# Patient Record
Sex: Male | Born: 1935 | Race: Black or African American | Hispanic: No | Marital: Single | Smoking: Current every day smoker
Health system: Southern US, Community
[De-identification: ages and names within clinical notes are randomized; demographics above are authoritative.]

## PROBLEM LIST (undated history)

## (undated) DIAGNOSIS — M899 Disorder of bone, unspecified: Secondary | ICD-10-CM

## (undated) DIAGNOSIS — M25559 Pain in unspecified hip: Secondary | ICD-10-CM

## (undated) DIAGNOSIS — C801 Malignant (primary) neoplasm, unspecified: Secondary | ICD-10-CM

## (undated) DIAGNOSIS — F19921 Other psychoactive substance use, unspecified with intoxication with delirium: Secondary | ICD-10-CM

## (undated) DIAGNOSIS — R1310 Dysphagia, unspecified: Secondary | ICD-10-CM

## (undated) DIAGNOSIS — R972 Elevated prostate specific antigen [PSA]: Secondary | ICD-10-CM

## (undated) DIAGNOSIS — G8929 Other chronic pain: Secondary | ICD-10-CM

## (undated) DIAGNOSIS — R55 Syncope and collapse: Principal | ICD-10-CM

## (undated) DIAGNOSIS — F1010 Alcohol abuse, uncomplicated: Secondary | ICD-10-CM

## (undated) DIAGNOSIS — S22000A Wedge compression fracture of unspecified thoracic vertebra, initial encounter for closed fracture: Secondary | ICD-10-CM

## (undated) DIAGNOSIS — M87059 Idiopathic aseptic necrosis of unspecified femur: Secondary | ICD-10-CM

## (undated) DIAGNOSIS — R319 Hematuria, unspecified: Secondary | ICD-10-CM

## (undated) DIAGNOSIS — J449 Chronic obstructive pulmonary disease, unspecified: Secondary | ICD-10-CM

## (undated) DIAGNOSIS — E162 Hypoglycemia, unspecified: Secondary | ICD-10-CM

## (undated) DIAGNOSIS — Z720 Tobacco use: Secondary | ICD-10-CM

## (undated) DIAGNOSIS — I8290 Acute embolism and thrombosis of unspecified vein: Secondary | ICD-10-CM

## (undated) DIAGNOSIS — M549 Dorsalgia, unspecified: Secondary | ICD-10-CM

## (undated) DIAGNOSIS — M4800 Spinal stenosis, site unspecified: Secondary | ICD-10-CM

## (undated) DIAGNOSIS — I2699 Other pulmonary embolism without acute cor pulmonale: Secondary | ICD-10-CM

## (undated) DIAGNOSIS — F10239 Alcohol dependence with withdrawal, unspecified: Secondary | ICD-10-CM

## (undated) DIAGNOSIS — R946 Abnormal results of thyroid function studies: Secondary | ICD-10-CM

## (undated) DIAGNOSIS — J189 Pneumonia, unspecified organism: Secondary | ICD-10-CM

## (undated) DIAGNOSIS — D539 Nutritional anemia, unspecified: Secondary | ICD-10-CM

## (undated) DIAGNOSIS — I82409 Acute embolism and thrombosis of unspecified deep veins of unspecified lower extremity: Secondary | ICD-10-CM

## (undated) HISTORY — PX: TONSILLECTOMY: SUR1361

## (undated) HISTORY — PX: TOTAL HIP ARTHROPLASTY: SHX124

---

## 2000-12-18 ENCOUNTER — Emergency Department (HOSPITAL_COMMUNITY): Admission: EM | Admit: 2000-12-18 | Discharge: 2000-12-19 | Payer: Self-pay | Admitting: *Deleted

## 2000-12-19 ENCOUNTER — Emergency Department (HOSPITAL_COMMUNITY): Admission: EM | Admit: 2000-12-19 | Discharge: 2000-12-19 | Payer: Self-pay | Admitting: Emergency Medicine

## 2000-12-27 ENCOUNTER — Emergency Department (HOSPITAL_COMMUNITY): Admission: EM | Admit: 2000-12-27 | Discharge: 2000-12-28 | Payer: Self-pay | Admitting: *Deleted

## 2001-01-20 ENCOUNTER — Emergency Department (HOSPITAL_COMMUNITY): Admission: EM | Admit: 2001-01-20 | Discharge: 2001-01-20 | Payer: Self-pay | Admitting: Emergency Medicine

## 2003-09-20 ENCOUNTER — Emergency Department (HOSPITAL_COMMUNITY): Admission: EM | Admit: 2003-09-20 | Discharge: 2003-09-20 | Payer: Self-pay

## 2003-11-07 ENCOUNTER — Emergency Department (HOSPITAL_COMMUNITY): Admission: EM | Admit: 2003-11-07 | Discharge: 2003-11-07 | Payer: Self-pay | Admitting: Emergency Medicine

## 2003-12-01 ENCOUNTER — Observation Stay (HOSPITAL_COMMUNITY): Admission: RE | Admit: 2003-12-01 | Discharge: 2003-12-02 | Payer: Self-pay | Admitting: General Surgery

## 2005-06-26 ENCOUNTER — Emergency Department (HOSPITAL_COMMUNITY): Admission: EM | Admit: 2005-06-26 | Discharge: 2005-06-26 | Payer: Self-pay | Admitting: Emergency Medicine

## 2006-06-13 ENCOUNTER — Emergency Department (HOSPITAL_COMMUNITY): Admission: EM | Admit: 2006-06-13 | Discharge: 2006-06-13 | Payer: Self-pay | Admitting: Emergency Medicine

## 2007-01-31 ENCOUNTER — Ambulatory Visit (HOSPITAL_COMMUNITY): Admission: RE | Admit: 2007-01-31 | Discharge: 2007-01-31 | Payer: Self-pay | Admitting: Internal Medicine

## 2007-02-25 ENCOUNTER — Encounter: Payer: Self-pay | Admitting: Orthopedic Surgery

## 2007-02-26 ENCOUNTER — Inpatient Hospital Stay (HOSPITAL_COMMUNITY): Admission: EM | Admit: 2007-02-26 | Discharge: 2007-03-06 | Payer: Self-pay | Admitting: Emergency Medicine

## 2007-04-30 ENCOUNTER — Inpatient Hospital Stay (HOSPITAL_COMMUNITY): Admission: RE | Admit: 2007-04-30 | Discharge: 2007-05-06 | Payer: Self-pay | Admitting: Internal Medicine

## 2007-08-29 ENCOUNTER — Ambulatory Visit: Payer: Self-pay | Admitting: Orthopedic Surgery

## 2007-08-29 DIAGNOSIS — M8700 Idiopathic aseptic necrosis of unspecified bone: Secondary | ICD-10-CM | POA: Insufficient documentation

## 2007-08-29 DIAGNOSIS — M545 Low back pain, unspecified: Secondary | ICD-10-CM | POA: Insufficient documentation

## 2007-08-29 DIAGNOSIS — M25559 Pain in unspecified hip: Secondary | ICD-10-CM

## 2007-08-29 DIAGNOSIS — M5430 Sciatica, unspecified side: Secondary | ICD-10-CM

## 2007-09-12 ENCOUNTER — Ambulatory Visit (HOSPITAL_COMMUNITY): Admission: RE | Admit: 2007-09-12 | Discharge: 2007-09-12 | Payer: Self-pay | Admitting: Orthopedic Surgery

## 2007-09-12 ENCOUNTER — Telehealth: Payer: Self-pay | Admitting: Orthopedic Surgery

## 2007-09-16 ENCOUNTER — Ambulatory Visit: Payer: Self-pay | Admitting: Orthopedic Surgery

## 2007-09-16 DIAGNOSIS — M4800 Spinal stenosis, site unspecified: Secondary | ICD-10-CM

## 2007-09-16 DIAGNOSIS — Q762 Congenital spondylolisthesis: Secondary | ICD-10-CM

## 2007-09-16 DIAGNOSIS — M5126 Other intervertebral disc displacement, lumbar region: Secondary | ICD-10-CM

## 2007-10-03 ENCOUNTER — Telehealth: Payer: Self-pay | Admitting: Orthopedic Surgery

## 2007-10-22 ENCOUNTER — Encounter: Payer: Self-pay | Admitting: Orthopedic Surgery

## 2008-02-12 ENCOUNTER — Ambulatory Visit: Payer: Self-pay | Admitting: Cardiology

## 2008-02-12 ENCOUNTER — Inpatient Hospital Stay (HOSPITAL_COMMUNITY): Admission: EM | Admit: 2008-02-12 | Discharge: 2008-02-28 | Payer: Self-pay | Admitting: Emergency Medicine

## 2008-02-12 ENCOUNTER — Ambulatory Visit: Payer: Self-pay | Admitting: Orthopedic Surgery

## 2008-02-14 ENCOUNTER — Encounter: Payer: Self-pay | Admitting: Orthopedic Surgery

## 2008-02-17 ENCOUNTER — Encounter (INDEPENDENT_AMBULATORY_CARE_PROVIDER_SITE_OTHER): Payer: Self-pay | Admitting: Internal Medicine

## 2008-02-23 ENCOUNTER — Encounter: Payer: Self-pay | Admitting: Orthopedic Surgery

## 2008-02-25 ENCOUNTER — Encounter: Payer: Self-pay | Admitting: Orthopedic Surgery

## 2008-02-26 ENCOUNTER — Encounter: Payer: Self-pay | Admitting: Orthopedic Surgery

## 2008-02-28 ENCOUNTER — Encounter: Payer: Self-pay | Admitting: Orthopedic Surgery

## 2008-02-28 ENCOUNTER — Inpatient Hospital Stay: Admission: RE | Admit: 2008-02-28 | Discharge: 2008-03-23 | Payer: Self-pay | Admitting: Internal Medicine

## 2008-03-04 ENCOUNTER — Ambulatory Visit: Payer: Self-pay | Admitting: Orthopedic Surgery

## 2008-03-04 DIAGNOSIS — S72143A Displaced intertrochanteric fracture of unspecified femur, initial encounter for closed fracture: Secondary | ICD-10-CM

## 2008-03-18 ENCOUNTER — Encounter: Payer: Self-pay | Admitting: Orthopedic Surgery

## 2008-03-20 ENCOUNTER — Ambulatory Visit (HOSPITAL_COMMUNITY): Admission: RE | Admit: 2008-03-20 | Discharge: 2008-03-20 | Payer: Self-pay | Admitting: Internal Medicine

## 2008-03-24 ENCOUNTER — Telehealth: Payer: Self-pay | Admitting: Orthopedic Surgery

## 2008-04-02 ENCOUNTER — Ambulatory Visit: Payer: Self-pay | Admitting: Orthopedic Surgery

## 2008-04-02 ENCOUNTER — Ambulatory Visit (HOSPITAL_COMMUNITY): Admission: RE | Admit: 2008-04-02 | Discharge: 2008-04-02 | Payer: Self-pay | Admitting: Orthopedic Surgery

## 2008-04-06 ENCOUNTER — Encounter: Payer: Self-pay | Admitting: Orthopedic Surgery

## 2008-05-13 ENCOUNTER — Ambulatory Visit: Payer: Self-pay | Admitting: Orthopedic Surgery

## 2008-06-28 ENCOUNTER — Emergency Department (HOSPITAL_COMMUNITY): Admission: EM | Admit: 2008-06-28 | Discharge: 2008-06-28 | Payer: Self-pay | Admitting: Emergency Medicine

## 2008-07-14 ENCOUNTER — Telehealth: Payer: Self-pay | Admitting: Orthopedic Surgery

## 2008-08-09 ENCOUNTER — Ambulatory Visit: Payer: Self-pay | Admitting: Cardiology

## 2008-08-09 ENCOUNTER — Inpatient Hospital Stay (HOSPITAL_COMMUNITY): Admission: EM | Admit: 2008-08-09 | Discharge: 2008-08-12 | Payer: Self-pay | Admitting: Emergency Medicine

## 2008-08-10 ENCOUNTER — Encounter (INDEPENDENT_AMBULATORY_CARE_PROVIDER_SITE_OTHER): Payer: Self-pay | Admitting: Internal Medicine

## 2008-08-10 ENCOUNTER — Encounter: Payer: Self-pay | Admitting: Cardiology

## 2008-09-10 ENCOUNTER — Telehealth: Payer: Self-pay | Admitting: Orthopedic Surgery

## 2009-08-08 ENCOUNTER — Emergency Department (HOSPITAL_COMMUNITY): Admission: EM | Admit: 2009-08-08 | Discharge: 2009-08-08 | Payer: Self-pay | Admitting: Emergency Medicine

## 2010-01-23 ENCOUNTER — Encounter: Payer: Self-pay | Admitting: General Surgery

## 2010-01-23 ENCOUNTER — Encounter: Payer: Self-pay | Admitting: Orthopedic Surgery

## 2010-03-18 LAB — COMPREHENSIVE METABOLIC PANEL
Albumin: 3.4 g/dL — ABNORMAL LOW (ref 3.5–5.2)
BUN: 9 mg/dL (ref 6–23)
CO2: 21 mEq/L (ref 19–32)
Chloride: 109 mEq/L (ref 96–112)
Creatinine, Ser: 0.74 mg/dL (ref 0.4–1.5)
GFR calc non Af Amer: >60 mL/min (ref >60)
Total Bilirubin: 0.6 mg/dL (ref 0.3–1.2)

## 2010-03-18 LAB — DIFFERENTIAL
Basophils Absolute: 0 10*3/uL (ref 0.0–0.1)
Lymphocytes Relative: 50 % — ABNORMAL HIGH (ref 12–46)
Neutro Abs: 1.7 10*3/uL (ref 1.7–7.7)

## 2010-03-18 LAB — CBC
MCH: 33 pg (ref 26.0–34.0)
MCHC: 33.8 g/dL (ref 30.0–36.0)
MCV: 97.8 fL (ref 78.0–100.0)
Platelets: 263 10*3/uL (ref 150–400)
RBC: 3.69 MIL/uL — ABNORMAL LOW (ref 4.22–5.81)

## 2010-03-18 LAB — POCT CARDIAC MARKERS
CKMB, poc: <1 ng/mL — ABNORMAL LOW (ref 1.0–8.0)
Myoglobin, poc: 32.6 ng/mL (ref 12–200)
Troponin i, poc: <0.05 ng/mL (ref 0.00–0.09)

## 2010-04-09 LAB — BASIC METABOLIC PANEL
BUN: 6 mg/dL (ref 6–23)
CO2: 28 mEq/L (ref 19–32)
Calcium: 8.1 mg/dL — ABNORMAL LOW (ref 8.4–10.5)
Chloride: 101 mEq/L (ref 96–112)
Creatinine, Ser: 0.7 mg/dL (ref 0.4–1.5)
GFR calc Af Amer: >60 mL/min (ref >60)

## 2010-04-09 LAB — COMPREHENSIVE METABOLIC PANEL
ALT: 14 U/L (ref 0–53)
Alkaline Phosphatase: 114 U/L (ref 39–117)
CO2: 28 mEq/L (ref 19–32)
GFR calc non Af Amer: >60 mL/min (ref >60)
Glucose, Bld: 74 mg/dL (ref 70–99)
Potassium: 4 mEq/L (ref 3.5–5.1)
Sodium: 139 mEq/L (ref 135–145)
Total Bilirubin: 0.4 mg/dL (ref 0.3–1.2)

## 2010-04-09 LAB — POCT CARDIAC MARKERS
CKMB, poc: 1.2 ng/mL (ref 1.0–8.0)
Myoglobin, poc: 16.8 ng/mL (ref 12–200)
Myoglobin, poc: 21.6 ng/mL (ref 12–200)

## 2010-04-09 LAB — BLOOD GAS, ARTERIAL
Bicarbonate: 20.9 mEq/L (ref 20.0–24.0)
O2 Saturation: 93.4 %
pO2, Arterial: 72 mmHg — ABNORMAL LOW (ref 80.0–100.0)

## 2010-04-09 LAB — CBC
Hemoglobin: 13.7 g/dL (ref 13.0–17.0)
RBC: 4.07 MIL/uL — ABNORMAL LOW (ref 4.22–5.81)

## 2010-04-09 LAB — RAPID URINE DRUG SCREEN, HOSP PERFORMED
Opiates: NOT DETECTED
Tetrahydrocannabinol: NOT DETECTED

## 2010-04-09 LAB — DIFFERENTIAL
Basophils Absolute: 0 10*3/uL (ref 0.0–0.1)
Blasts: 0 %
Myelocytes: 0 %
Neutro Abs: 1.6 10*3/uL — ABNORMAL LOW (ref 1.7–7.7)
Neutrophils Relative %: 48 % (ref 43–77)
Promyelocytes Absolute: 0 %
nRBC: 0 /100 WBC

## 2010-04-09 LAB — ETHANOL: Alcohol, Ethyl (B): 141 mg/dL — ABNORMAL HIGH (ref 0–10)

## 2010-04-19 LAB — BASIC METABOLIC PANEL
BUN: 3 mg/dL — ABNORMAL LOW (ref 6–23)
BUN: 4 mg/dL — ABNORMAL LOW (ref 6–23)
BUN: 4 mg/dL — ABNORMAL LOW (ref 6–23)
CO2: 28 mEq/L (ref 19–32)
CO2: 27 mEq/L (ref 19–32)
CO2: 27 mEq/L (ref 19–32)
Calcium: 7.6 mg/dL — ABNORMAL LOW (ref 8.4–10.5)
Calcium: 7.6 mg/dL — ABNORMAL LOW (ref 8.4–10.5)
Calcium: 7.6 mg/dL — ABNORMAL LOW (ref 8.4–10.5)
Calcium: 7.6 mg/dL — ABNORMAL LOW (ref 8.4–10.5)
Calcium: 8.5 mg/dL (ref 8.4–10.5)
Chloride: 101 mEq/L (ref 96–112)
Chloride: 102 mEq/L (ref 96–112)
Chloride: 101 mEq/L (ref 96–112)
Chloride: 103 mEq/L (ref 96–112)
Chloride: 104 mEq/L (ref 96–112)
Chloride: 107 mEq/L (ref 96–112)
Creatinine, Ser: 0.62 mg/dL (ref 0.4–1.5)
Creatinine, Ser: 0.77 mg/dL (ref 0.4–1.5)
GFR calc Af Amer: >60 mL/min (ref >60)
GFR calc Af Amer: >60 mL/min (ref >60)
GFR calc Af Amer: >60 mL/min (ref >60)
GFR calc Af Amer: >60 mL/min (ref >60)
GFR calc Af Amer: >60 mL/min (ref >60)
GFR calc Af Amer: >60 mL/min (ref >60)
GFR calc non Af Amer: >60 mL/min (ref >60)
GFR calc non Af Amer: >60 mL/min (ref >60)
GFR calc non Af Amer: >60 mL/min (ref >60)
GFR calc non Af Amer: >60 mL/min (ref >60)
GFR calc non Af Amer: >60 mL/min (ref >60)
GFR calc non Af Amer: >60 mL/min (ref >60)
Glucose, Bld: 108 mg/dL — ABNORMAL HIGH (ref 70–99)
Glucose, Bld: 76 mg/dL (ref 70–99)
Potassium: 3.9 mEq/L (ref 3.5–5.1)
Potassium: 5.1 mEq/L (ref 3.5–5.1)
Sodium: 132 mEq/L — ABNORMAL LOW (ref 135–145)
Sodium: 134 mEq/L — ABNORMAL LOW (ref 135–145)
Sodium: 135 mEq/L (ref 135–145)
Sodium: 135 mEq/L (ref 135–145)
Sodium: 132 mEq/L — ABNORMAL LOW (ref 135–145)
Sodium: 134 mEq/L — ABNORMAL LOW (ref 135–145)

## 2010-04-19 LAB — DIFFERENTIAL
Basophils Absolute: 0 10*3/uL (ref 0.0–0.1)
Basophils Absolute: 0 10*3/uL (ref 0.0–0.1)
Basophils Absolute: 0 10*3/uL (ref 0.0–0.1)
Basophils Absolute: 0.1 10*3/uL (ref 0.0–0.1)
Basophils Relative: 0 % (ref 0–1)
Basophils Relative: 0 % (ref 0–1)
Basophils Relative: 1 % (ref 0–1)
Basophils Relative: 1 % (ref 0–1)
Eosinophils Absolute: 0 10*3/uL (ref 0.0–0.7)
Eosinophils Absolute: 0.1 10*3/uL (ref 0.0–0.7)
Eosinophils Absolute: 0.1 10*3/uL (ref 0.0–0.7)
Eosinophils Absolute: 0.2 10*3/uL (ref 0.0–0.7)
Eosinophils Absolute: 0.2 10*3/uL (ref 0.0–0.7)
Eosinophils Relative: 0 % (ref 0–5)
Eosinophils Relative: 0 % (ref 0–5)
Eosinophils Relative: 1 % (ref 0–5)
Eosinophils Relative: 2 % (ref 0–5)
Eosinophils Relative: 3 % (ref 0–5)
Eosinophils Relative: 4 % (ref 0–5)
Lymphocytes Relative: 16 % (ref 12–46)
Lymphocytes Relative: 19 % (ref 12–46)
Lymphocytes Relative: 25 % (ref 12–46)
Lymphocytes Relative: 27 % (ref 12–46)
Lymphocytes Relative: 30 % (ref 12–46)
Lymphocytes Relative: 39 % (ref 12–46)
Lymphs Abs: 1.5 10*3/uL (ref 0.7–4.0)
Lymphs Abs: 1.6 10*3/uL (ref 0.7–4.0)
Lymphs Abs: 2 10*3/uL (ref 0.7–4.0)
Monocytes Absolute: 0.5 10*3/uL (ref 0.1–1.0)
Monocytes Absolute: 0.8 10*3/uL (ref 0.1–1.0)
Monocytes Absolute: 0.7 10*3/uL (ref 0.1–1.0)
Monocytes Absolute: 0.8 10*3/uL (ref 0.1–1.0)
Monocytes Absolute: 0.9 10*3/uL (ref 0.1–1.0)
Monocytes Relative: 12 % (ref 3–12)
Monocytes Relative: 13 % — ABNORMAL HIGH (ref 3–12)
Monocytes Relative: 10 % (ref 3–12)
Monocytes Relative: 11 % (ref 3–12)
Monocytes Relative: 13 % — ABNORMAL HIGH (ref 3–12)
Monocytes Relative: 9 % (ref 3–12)
Neutro Abs: 4.9 10*3/uL (ref 1.7–7.7)
Neutro Abs: 4.9 10*3/uL (ref 1.7–7.7)
Neutro Abs: 3.7 10*3/uL (ref 1.7–7.7)
Neutro Abs: 3.9 10*3/uL (ref 1.7–7.7)
Neutrophils Relative %: 57 % (ref 43–77)
Neutrophils Relative %: 67 % (ref 43–77)
Neutrophils Relative %: 75 % (ref 43–77)
Neutrophils Relative %: 56 % (ref 43–77)

## 2010-04-19 LAB — CBC
HCT: 28.2 % — ABNORMAL LOW (ref 39.0–52.0)
HCT: 34.6 % — ABNORMAL LOW (ref 39.0–52.0)
HCT: 24.1 % — ABNORMAL LOW (ref 39.0–52.0)
HCT: 26.8 % — ABNORMAL LOW (ref 39.0–52.0)
HCT: 29.4 % — ABNORMAL LOW (ref 39.0–52.0)
Hemoglobin: 11.5 g/dL — ABNORMAL LOW (ref 13.0–17.0)
Hemoglobin: 8.4 g/dL — ABNORMAL LOW (ref 13.0–17.0)
Hemoglobin: 9.5 g/dL — ABNORMAL LOW (ref 13.0–17.0)
Hemoglobin: 8 g/dL — ABNORMAL LOW (ref 13.0–17.0)
Hemoglobin: 8.2 g/dL — ABNORMAL LOW (ref 13.0–17.0)
MCHC: 33.2 g/dL (ref 30.0–36.0)
MCHC: 33.7 g/dL (ref 30.0–36.0)
MCHC: 34.1 g/dL (ref 30.0–36.0)
MCHC: 33.7 g/dL (ref 30.0–36.0)
MCHC: 34.1 g/dL (ref 30.0–36.0)
MCHC: 34.2 g/dL (ref 30.0–36.0)
MCV: 91.9 fL (ref 78.0–100.0)
MCV: 91.9 fL (ref 78.0–100.0)
MCV: 92.1 fL (ref 78.0–100.0)
MCV: 92.8 fL (ref 78.0–100.0)
MCV: 91.2 fL (ref 78.0–100.0)
MCV: 91.4 fL (ref 78.0–100.0)
MCV: 91.9 fL (ref 78.0–100.0)
Platelets: 119 10*3/uL — ABNORMAL LOW (ref 150–400)
Platelets: 127 10*3/uL — ABNORMAL LOW (ref 150–400)
Platelets: 269 10*3/uL (ref 150–400)
Platelets: 420 10*3/uL — ABNORMAL HIGH (ref 150–400)
Platelets: 493 10*3/uL — ABNORMAL HIGH (ref 150–400)
RBC: 3.07 MIL/uL — ABNORMAL LOW (ref 4.22–5.81)
RBC: 3.76 MIL/uL — ABNORMAL LOW (ref 4.22–5.81)
RBC: 2.37 MIL/uL — ABNORMAL LOW (ref 4.22–5.81)
RBC: 2.55 MIL/uL — ABNORMAL LOW (ref 4.22–5.81)
RBC: 2.64 MIL/uL — ABNORMAL LOW (ref 4.22–5.81)
RDW: 19.1 % — ABNORMAL HIGH (ref 11.5–15.5)
RDW: 20.1 % — ABNORMAL HIGH (ref 11.5–15.5)
RDW: 17.8 % — ABNORMAL HIGH (ref 11.5–15.5)
RDW: 18.1 % — ABNORMAL HIGH (ref 11.5–15.5)
RDW: 18.6 % — ABNORMAL HIGH (ref 11.5–15.5)
WBC: 5.5 10*3/uL (ref 4.0–10.5)
WBC: 6 10*3/uL (ref 4.0–10.5)
WBC: 7.7 10*3/uL (ref 4.0–10.5)
WBC: 6.6 10*3/uL (ref 4.0–10.5)
WBC: 6.6 10*3/uL (ref 4.0–10.5)
WBC: 8.4 10*3/uL (ref 4.0–10.5)

## 2010-04-19 LAB — WOUND CULTURE

## 2010-04-19 LAB — COMPREHENSIVE METABOLIC PANEL
AST: 19 U/L (ref 0–37)
BUN: 5 mg/dL — ABNORMAL LOW (ref 6–23)
CO2: 29 mEq/L (ref 19–32)
Chloride: 99 mEq/L (ref 96–112)
Creatinine, Ser: 0.68 mg/dL (ref 0.4–1.5)
GFR calc Af Amer: >60 mL/min (ref >60)
GFR calc non Af Amer: >60 mL/min (ref >60)
Glucose, Bld: 90 mg/dL (ref 70–99)
Total Bilirubin: 0.8 mg/dL (ref 0.3–1.2)

## 2010-04-19 LAB — TSH: TSH: 1.296 u[IU]/mL (ref 0.350–4.500)

## 2010-04-19 LAB — PROTIME-INR
INR: 1 (ref 0.00–1.49)
Prothrombin Time: 13.5 seconds (ref 11.6–15.2)

## 2010-04-19 LAB — CROSSMATCH

## 2010-04-19 LAB — TYPE AND SCREEN

## 2010-04-19 LAB — CLOSTRIDIUM DIFFICILE EIA

## 2010-04-19 LAB — HEMOGLOBIN AND HEMATOCRIT, BLOOD
HCT: 29.7 % — ABNORMAL LOW (ref 39.0–52.0)
Hemoglobin: 10.2 g/dL — ABNORMAL LOW (ref 13.0–17.0)

## 2010-05-05 ENCOUNTER — Emergency Department (HOSPITAL_COMMUNITY): Payer: Medicare Other

## 2010-05-05 ENCOUNTER — Inpatient Hospital Stay (HOSPITAL_COMMUNITY)
Admission: EM | Admit: 2010-05-05 | Discharge: 2010-05-10 | DRG: 175 | Disposition: A | Discharge status: Skilled Nursing Facility | Payer: Medicare Other | Attending: Internal Medicine | Admitting: Internal Medicine

## 2010-05-05 DIAGNOSIS — J441 Chronic obstructive pulmonary disease with (acute) exacerbation: Secondary | ICD-10-CM | POA: Diagnosis present

## 2010-05-05 DIAGNOSIS — E46 Unspecified protein-calorie malnutrition: Secondary | ICD-10-CM | POA: Diagnosis present

## 2010-05-05 DIAGNOSIS — I2699 Other pulmonary embolism without acute cor pulmonale: Principal | ICD-10-CM | POA: Diagnosis present

## 2010-05-05 DIAGNOSIS — M1990 Unspecified osteoarthritis, unspecified site: Secondary | ICD-10-CM | POA: Diagnosis present

## 2010-05-05 DIAGNOSIS — J189 Pneumonia, unspecified organism: Secondary | ICD-10-CM | POA: Diagnosis present

## 2010-05-05 DIAGNOSIS — F17200 Nicotine dependence, unspecified, uncomplicated: Secondary | ICD-10-CM | POA: Diagnosis present

## 2010-05-05 DIAGNOSIS — M810 Age-related osteoporosis without current pathological fracture: Secondary | ICD-10-CM | POA: Diagnosis present

## 2010-05-05 DIAGNOSIS — R627 Adult failure to thrive: Secondary | ICD-10-CM | POA: Diagnosis present

## 2010-05-05 DIAGNOSIS — M8448XA Pathological fracture, other site, initial encounter for fracture: Secondary | ICD-10-CM | POA: Diagnosis present

## 2010-05-05 DIAGNOSIS — R972 Elevated prostate specific antigen [PSA]: Secondary | ICD-10-CM | POA: Diagnosis present

## 2010-05-05 DIAGNOSIS — F1020 Alcohol dependence, uncomplicated: Secondary | ICD-10-CM | POA: Diagnosis present

## 2010-05-05 DIAGNOSIS — Z9181 History of falling: Secondary | ICD-10-CM

## 2010-05-05 DIAGNOSIS — R937 Abnormal findings on diagnostic imaging of other parts of musculoskeletal system: Secondary | ICD-10-CM | POA: Diagnosis present

## 2010-05-05 DIAGNOSIS — E876 Hypokalemia: Secondary | ICD-10-CM | POA: Diagnosis not present

## 2010-05-05 DIAGNOSIS — Z681 Body mass index (BMI) 19 or less, adult: Secondary | ICD-10-CM

## 2010-05-05 DIAGNOSIS — D539 Nutritional anemia, unspecified: Secondary | ICD-10-CM | POA: Diagnosis present

## 2010-05-05 LAB — GLUCOSE, CAPILLARY: Glucose-Capillary: 121 mg/dL — ABNORMAL HIGH (ref 70–99)

## 2010-05-05 LAB — CBC
MCH: 35.9 pg — ABNORMAL HIGH (ref 26.0–34.0)
MCHC: 34.2 g/dL (ref 30.0–36.0)
Platelets: 240 10*3/uL (ref 150–400)
RBC: 3.09 MIL/uL — ABNORMAL LOW (ref 4.22–5.81)
RDW: 15.9 % — ABNORMAL HIGH (ref 11.5–15.5)

## 2010-05-05 LAB — DIFFERENTIAL
Basophils Relative: 0 % (ref 0–1)
Eosinophils Absolute: 0 10*3/uL (ref 0.0–0.7)
Eosinophils Relative: 0 % (ref 0–5)
Monocytes Absolute: 1.1 10*3/uL — ABNORMAL HIGH (ref 0.1–1.0)
Monocytes Relative: 9 % (ref 3–12)
Neutrophils Relative %: 82 % — ABNORMAL HIGH (ref 43–77)

## 2010-05-05 LAB — BASIC METABOLIC PANEL
CO2: 21 mEq/L (ref 19–32)
Calcium: 8.7 mg/dL (ref 8.4–10.5)
Creatinine, Ser: 0.55 mg/dL (ref 0.4–1.5)
GFR calc Af Amer: >60 mL/min (ref >60)

## 2010-05-05 MED ORDER — IOHEXOL 300 MG/ML  SOLN
80.0000 mL | Freq: Once | INTRAMUSCULAR | Status: AC | PRN
  Administered 2010-05-05: 80 mL via INTRAVENOUS

## 2010-05-06 ENCOUNTER — Inpatient Hospital Stay (HOSPITAL_COMMUNITY): Payer: Medicare Other

## 2010-05-06 LAB — CBC
HCT: 26.5 % — ABNORMAL LOW (ref 39.0–52.0)
Platelets: 190 10*3/uL (ref 150–400)
RDW: 15.3 % (ref 11.5–15.5)
WBC: 12.5 10*3/uL — ABNORMAL HIGH (ref 4.0–10.5)

## 2010-05-06 LAB — HEPATIC FUNCTION PANEL
ALT: 12 U/L (ref 0–53)
Alkaline Phosphatase: 132 U/L — ABNORMAL HIGH (ref 39–117)
Bilirubin, Direct: 0.6 mg/dL — ABNORMAL HIGH (ref 0.0–0.3)
Indirect Bilirubin: 0.6 mg/dL (ref 0.3–0.9)
Total Bilirubin: 1.2 mg/dL (ref 0.3–1.2)

## 2010-05-06 LAB — BASIC METABOLIC PANEL
CO2: 27 mEq/L (ref 19–32)
Calcium: 7.6 mg/dL — ABNORMAL LOW (ref 8.4–10.5)
Creatinine, Ser: 0.47 mg/dL (ref 0.4–1.5)
Glucose, Bld: 103 mg/dL — ABNORMAL HIGH (ref 70–99)

## 2010-05-06 LAB — DIFFERENTIAL
Basophils Absolute: 0 10*3/uL (ref 0.0–0.1)
Basophils Relative: 0 % (ref 0–1)
Eosinophils Relative: 0 % (ref 0–5)
Monocytes Absolute: 1.1 10*3/uL — ABNORMAL HIGH (ref 0.1–1.0)
Monocytes Relative: 8 % (ref 3–12)

## 2010-05-06 LAB — PROTIME-INR
INR: 1.03 (ref 0.00–1.49)
Prothrombin Time: 13.7 seconds (ref 11.6–15.2)

## 2010-05-06 MED ORDER — IOHEXOL 300 MG/ML  SOLN
100.0000 mL | Freq: Once | INTRAMUSCULAR | Status: AC | PRN
  Administered 2010-05-06: 100 mL via INTRAVENOUS

## 2010-05-06 NOTE — H&P (Signed)
Jermaine [name], ERNEY NO.:  [id number]  MEDICAL RECORD NO.:  [id number]           PATIENT TYPE:  I  LOCATION:  A329                          FACILITY:  [facility]  PHYSICIAN:  Vania Rea, M.D. DATE OF BIRTH:  Mar 25, 1931  DATE OF ADMISSION:  05/05/2010 DATE OF DISCHARGE:  LH                             HISTORY & PHYSICAL   PRIMARY CARE PHYSICIAN:  Unassigned.  CHIEF COMPLAINT:  Fell yesterday.  HISTORY OF PRESENT ILLNESS:  This is a 77 year old gentleman who lives alone, has a past history of bilateral hip necrosis.  He is status post left hip repair after hip fracture who is also a chronic alcoholic who apparently fell at home yesterday and eventually called EMS today because he was so weak he could not get around.  He complained of pain in his left hip.  The patient also complains of a nonproductive cough for the past 3 days. He denies any fever, chest pain, or shortness of breath.  Denies any nausea or vomiting.  He reports that he probably drinks a fifth of spirits per weekend, but denies daily alcohol use.  The patient was brought to the emergency room because of his hip pain, had his hips x-rayed, no acute abnormalities were found, but because of his cough and ill-looking appearance, chest x-ray was also done, which was suggestive of the right chest mass suspicious for carcinoma.  CT scan of the chest with contrast was done, which  revealed evidence of right lower lobe pulmonary embolism, and right lower lobe pneumonia with  necrosis.   His initial blood sugar was 48, rising to 131 after a meal. he is not diabetic.  The Hospitalist Service was called to assist with management.  The patient's only complaints at the moment is back pain especially when extending his legs.  PAST MEDICAL HISTORY: 1. Chronic alcohol abuse. 2. Status post left hip repair. 3. History of bilateral hip avascular necrosis. 4. Past history of DVT of the left lower  extremity.  MEDICATIONS:  None.  ALLERGIES:  No known drug allergies.  SOCIAL HISTORY: 1. Reports that he smokes about half-pack per day and has been doing     so for many years.  Reports that he smokes one-fifth of spirits per     weekend, but is somewhat evasive. 2. He denies cannabis abuse, although it is reported in his chart. 3. 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  FAMILY HISTORY:  Significant for coronary artery disease, he otherwise does not know much else about his family medical problems.  REVIEW OF SYSTEMS:  Other than noted above, unremarkable.  PHYSICAL EXAMINATION:  GENERAL:  Cachectic elderly gentleman reclining in the bed with his legs drawn up. VITALS: Temperature is 98.5, pulse 78, respirations 19, blood pressure 98/70 He is saturating at 96% on room air. HEENT:  His pupils are round and equal.  Mucous membranes pink. Anicteric. Purulent discahrge both eyes. He is moderately dehydrated. NECK:  No cervical lymphadenopathy.  Has distended  external jugular veins.  No carotid bruit. CHEST:  He has diffuse rhonchi bilaterally. CARDIOVASCULAR SYSTEM:  Regular rhythm.  No murmur. ABDOMEN:  Scaphoid, soft, nontender.  No masses.  No flank dullness. EXTREMITIES:  Markedly wasted.  He has arthritic deformities of the knees and ankles.  He has no edema but he has 2+ dorsalis pedis pulses bilaterally. SKIN:  His skin of his feet are dry and scaly, but there are no open wounds. CENTRAL NERVOUS SYSTEM:  Cranial nerves II-XII are grossly intact.  He has no focal lateralizing signs.  LABS:  His white count is elevated at 12.5, hemoglobin 11.1, his MCV is 105, his platelets are 240, absolute neutrophil count is 10.2.  His sodium is 137, potassium is 3.9, chloride 95, CO2 21, glucose low at 48, BUN 16, creatinine is 0.55.  His calcium is 8.7.  His ammonia level was normal.  CT  scan of his chest with contrast shows pneumonia involving the posterior right lower lobe with associated necrosis which account for the abnormalities seen early on the chest x-ray.  There was no discrete mass.  There is an acute pulmonary embolus of the segmental branch of the right lower lobe pulmonary artery.  He has scar and bronchiectasis deep in the left lower lobe.  He has severe COPD and emphysema.  He has a stable 4.7-cm ascending thoracic aneurysm when compared to the CT done in August 2010.  He has osteoporotic compression fracture at T10, age indeterminate.  He has bilateral adrenal hyperplasia.  ASSESSMENT: 1. Acute pulmonary embolus. 2. Right lower lobe pneumonia, community acquired, questionable     aspiration in view of the patient's alcoholism. 3. Chronic alcoholism. 4. Tobacco abuse. 5. Malnutrition/failure to thrive. 6. Osteoarthritis. 7. T10 compression fracture. 8. Hypoglycemia, improved after a meal.  PLAN: 1. We will admit this gentleman for treatment of his pneumonia and     pulmonary embolus and start him on anticoagulation.  Because of his     alcoholism, we will save Zosyn and Avelox for treatment of his     pneumonia. 2. For his COPD exacerbation, we will give serial nebs, but will avoid     steroids at this time, will give nicotine replacement and place him     on a CIWA protocol which will include B vitamin supplementation. 3. Other plans as per orders.     Vania Rea, M.D.     LC/MEDQ  D:  05/05/2010  T:  05/05/2010  Job:  [id number]  Electronically Signed by Vania Rea M.D. on 05/06/2010 06:35:25 AM

## 2010-05-06 NOTE — H&P (Signed)
Jermaine [name], Jermaine [name] NO.:  [id number]  MEDICAL RECORD NO.:  [id number]           PATIENT TYPE:  I  LOCATION:  A329                          FACILITY:  [facility]  PHYSICIAN:  Vania Rea, M.D. DATE OF BIRTH:  08-12-1931  DATE OF ADMISSION:  05/05/2010 DATE OF DISCHARGE:  LH                             HISTORY & PHYSICAL   PRIMARY CARE PHYSICIAN:  Unassigned.  CHIEF COMPLAINT:  Fell yesterday.  HISTORY OF PRESENT ILLNESS:  This is a 77 year old gentleman who lives alone, has a past history of bilateral hip necrosis.  He is status post left hip repair after hip fracture who is also a chronic alcoholic who apparently fell at home yesterday and eventually called EMS today because he was so weak he could not get around.  He complained of pain in his left hip.  The patient also complains of a nonproductive cough for the past 3 days. He denies any fever, chest pain, or shortness of breath.  Denies any nausea or vomiting.  He reports that he probably drinks a fifth of spirits per weekend, but denies daily alcohol use.  The patient was brought to the emergency room because of his hip pain, had his hips x-rayed, no acute abnormalities were found, but because of his cough and ill-looking appearance, chest x-ray was also done, which was suggestive of the right chest mass suspicious for carcinoma.  CT scan of the chest with contrast was done, which  revealed evidence of right lower lobe pulmonary embolism, and right lower lobe pneumonia with  necrosis.   His initial blood sugar was 48, rising to 131 after a meal. he is not diabetic.  The Hospitalist Service was called to assist with management.  The patient's only complaints at the moment is back pain especially when extending his legs.  PAST MEDICAL HISTORY: 1. Chronic alcohol abuse. 2. Status post left hip repair. 3. History of bilateral hip avascular necrosis. 4. Past history of DVT of the left lower  extremity.  MEDICATIONS:  None.  ALLERGIES:  No known drug allergies.  SOCIAL HISTORY: 1. Reports that he smokes about half-pack per day and has been doing     so for many years.  Reports that he smokes one-fifth of spirits per     weekend, but is somewhat evasive. 2. He denies cannabis abuse, although it is reported in his chart. 3. 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  FAMILY HISTORY:  Significant for coronary artery disease, he otherwise does not know much else about his family medical problems.  REVIEW OF SYSTEMS:  Other than noted above, unremarkable.  PHYSICAL EXAMINATION:  GENERAL:  Cachectic elderly gentleman reclining in the bed with his legs drawn up. VITALS: Temperature is 98.5, pulse 78, respirations 19, blood pressure 98/70 He is saturating at 96% on room air. HEENT:  His pupils are round and equal.  Mucous membranes pink. Anicteric. Purulent discahrge both eyes. He is moderately dehydrated. NECK:  No cervical lymphadenopathy.  Has distended  external jugular veins.  No carotid bruit. CHEST:  He has diffuse rhonchi bilaterally. CARDIOVASCULAR SYSTEM:  Regular rhythm.  No murmur. ABDOMEN:  Scaphoid, soft, nontender.  No masses.  No flank dullness. EXTREMITIES:  Markedly wasted.  He has arthritic deformities of the knees and ankles.  He has no edema but he has 2+ dorsalis pedis pulses bilaterally. SKIN:  His skin of his feet are dry and scaly, but there are no open wounds. CENTRAL NERVOUS SYSTEM:  Cranial nerves II-XII are grossly intact.  He has no focal lateralizing signs.  LABS:  His white count is elevated at 12.5, hemoglobin 11.1, his MCV is 105, his platelets are 240, absolute neutrophil count is 10.2.  His sodium is 137, potassium is 3.9, chloride 95, CO2 21, glucose low at 48, BUN 16, creatinine is 0.55.  His calcium is 8.7.  His ammonia level was normal.  CT  scan of his chest with contrast shows pneumonia involving the posterior right lower lobe with associated necrosis which account for the abnormalities seen early on the chest x-ray.  There was no discrete mass.  There is an acute pulmonary embolus of the segmental branch of the right lower lobe pulmonary artery.  He has scar and bronchiectasis deep in the left lower lobe.  He has severe COPD and emphysema.  He has a stable 4.7-cm ascending thoracic aneurysm when compared to the CT done in August 2010.  He has osteoporotic compression fracture at T10, age indeterminate.  He has bilateral adrenal hyperplasia.  ASSESSMENT: 1. Acute pulmonary embolus. 2. Right lower lobe pneumonia, community acquired, questionable     aspiration in view of the patient's alcoholism. 3. Chronic alcoholism. 4. Tobacco abuse. 5. Malnutrition/failure to thrive. 6. Osteoarthritis. 7. T10 compression fracture. 8. Hypoglycemia, improved after a meal.  PLAN: 1. We will admit this gentleman for treatment of his pneumonia and     pulmonary embolus and start him on anticoagulation.  Because of his     alcoholism, we will save Zosyn and Avelox for treatment of his     pneumonia. 2. For his COPD exacerbation, we will give serial nebs, but will avoid     steroids at this time, will give nicotine replacement and place him     on a CIWA protocol which will include B vitamin supplementation. 3. Other plans as per orders.     Vania Rea, M.D.  LC/MEDQ  D:  05/05/2010  T:  05/06/2010  Job:  [id number]  Electronically Signed by Vania Rea M.D. on 05/06/2010 06:36:17 AM

## 2010-05-06 NOTE — Group Therapy Note (Signed)
NAMETERRACE, Jermaine                ACCOUNT NO.:  [id number]  MEDICAL RECORD NO.:  [id number]           PATIENT TYPE:  I  LOCATION:  A329                          FACILITY:  [facility]  PHYSICIAN:  Wilson Singer, M.D.DATE OF BIRTH:  May 18, 1931  DATE OF PROCEDURE:  05/06/2010 DATE OF DISCHARGE:                                PROGRESS NOTE   This man was admitted yesterday having had a fall.  He had a chest x-ray done as part of the workup and this was suggestive of mass in the right lung.  He then underwent a CT chest scan which actually showed the presence of right lower lobe pneumonia together with an acute pulmonary embolism in the right, one of the segmental branch of the right pulmonary arteries.  He has therefore been admitted and appropriately been placed on intravenous antibiotics.  Interestingly, his INR was 2.73 on admission!  He is on no medications whatsoever.  He does have a history of alcohol abuse, and apparently drinks significantly but only at the weekends.  He lives alone.  PHYSICAL EXAMINATION:  VITAL SIGNS:  Temperature 98.2, blood pressure 101/78, pulse 113, saturation 96% on room air.  Weight 45.2 kg only. GENERAL:  He looks cachectic.  There is no obvious jaundice.  There are no obvious signs of chronic liver disease.  He is now clubbed.  There is no clinical pallor. CARDIOVASCULAR:  Heart sounds are present and normal. LUNGS:  Lung fields are clear with some crackles in the right mid and lower zones. ABDOMEN:  Soft and there is no evidence of hepatosplenomegaly. NEUROLOGICAL:  He is alert and there are no focal neurological signs. He does not appear at this present time to be delirious, but I would not be surprised if he withdraws from alcohol in the next day or 2.  INVESTIGATIONS:  Hemoglobin 9.0 with an MCV of 103, white blood cell count 12.5, platelets 190, albumin 2.5, INR 2.73.  Sodium 134, potassium 3.6, bicarbonate 27, BUN 15, creatinine  0.47.  IMPRESSION: 1. Right lower lobe pneumonia. 2. Acute pulmonary embolism in the right lower lobe. 3. Chronic obstructive pulmonary disease. 4. Alcoholism. 5. Macrocytic anemia, likely from a combination of alcohol toxicity     and either B12 or folate deficiency.  PLAN: 1. Continue antibiotics. 2. I do not believe there is a need for anticoagulation at this point     in view of his INR of 2.73.  In any case, I think it will be unsafe     to anticoagulate this man who has a history of falls. 3. CT of the abdomen to look at his liver a little bit more closely as     I believe he does probably have cirrhosis     based on his lab work. 4. PT and OT consultation.  I do not think this man is going to be     safe in his own home and he may require long-term nursing facility     placement.     Wilson Singer, M.D.     NCG/MEDQ  D:  05/06/2010  T:  05/06/2010  Job:  [id number]  Electronically Signed by Lilly Cove M.D. on 05/06/2010 12:43:01 PM

## 2010-05-07 LAB — COMPREHENSIVE METABOLIC PANEL
AST: 29 U/L (ref 0–37)
Albumin: 1.9 g/dL — ABNORMAL LOW (ref 3.5–5.2)
Alkaline Phosphatase: 97 U/L (ref 39–117)
Chloride: 99 mEq/L (ref 96–112)
Potassium: 3.2 mEq/L — ABNORMAL LOW (ref 3.5–5.1)
Total Bilirubin: 0.8 mg/dL (ref 0.3–1.2)
Total Protein: 5.2 g/dL — ABNORMAL LOW (ref 6.0–8.3)

## 2010-05-07 LAB — IRON AND TIBC
TIBC: 83 ug/dL — ABNORMAL LOW (ref 215–435)
UIBC: 64 ug/dL

## 2010-05-07 LAB — DIFFERENTIAL
Basophils Absolute: 0 10*3/uL (ref 0.0–0.1)
Basophils Relative: 0 % (ref 0–1)
Eosinophils Absolute: 0 10*3/uL (ref 0.0–0.7)
Neutrophils Relative %: 78 % — ABNORMAL HIGH (ref 43–77)

## 2010-05-07 LAB — CBC
Platelets: 190 10*3/uL (ref 150–400)
RBC: 2.49 MIL/uL — ABNORMAL LOW (ref 4.22–5.81)
RDW: 15.4 % (ref 11.5–15.5)
WBC: 9.3 10*3/uL (ref 4.0–10.5)

## 2010-05-07 LAB — FOLATE: Folate: 5.2 ng/mL

## 2010-05-07 LAB — VITAMIN B12: Vitamin B-12: 514 pg/mL (ref 211–911)

## 2010-05-07 NOTE — Group Therapy Note (Signed)
  [name], Jermaine [name]                ACCOUNT NO.:  [id number]  MEDICAL RECORD NO.:  [id number]           PATIENT TYPE:  I  LOCATION:  A329                          FACILITY:  [facility]  PHYSICIAN:  Wilson Singer, M.D.DATE OF BIRTH:  February 01, 1931  DATE OF PROCEDURE:  05/07/2010 DATE OF DISCHARGE:                                PROGRESS NOTE   SUBJECTIVE:  This man had a CT of his abdomen done yesterday and shows left lower lobe consolidation as well as the CT chest showing a day before that he had a right lower lobe pneumonia.  Interestingly, the CT scan of his abdomen showed also that he had compression fractures of L2, L3 and L4, but also a probable bony destructive lesion on the left ileum possibly metastatic disease.  He is certainly very cachectic.  There is no evidence of liver cirrhosis on the CT scan.  There is no splenomegaly.  He says, he is hurting, but cannot specify more than this.  OBJECTIVE:  Temperature 97.9, blood pressure 97/64, pulse 89, saturation 97%.  There are no new physical findings apart from him being cachectic as before.  INVESTIGATIONS:  The INR yesterday was 1.03.  On admission, his INR was 2.73, so I am not sure how this normally arose.  Hemoglobin 9.1, white blood cells which is stable, white blood cell count 9.3, platelets 190. Sodium 136, potassium 3.2, bicarbonate 27, BUN 10, creatinine less than 0.47, albumin 1.9.  ESR is mildly elevated at 45.  IMPRESSION: 1. Right and left lower lobe pneumonia. 2. Acute pulmonary embolism. 3. Cachexia with compression fractures and left ilium lytic lesion. 4. Chronic obstructive pulmonary disease. 5. Alcoholism. 6. Macrocytic anemia.  PLAN: 1. We will restart anticoagulation, since he is clearly not     anticoagulated now. 2. Check PSA. 3. Check bone scan. 4. He will need skilled nursing facility placement.     Wilson Singer, M.D.     NCG/MEDQ  D:  05/07/2010  T:  05/07/2010  Job:   [id number]  Electronically Signed by Lilly Cove M.D. on 05/07/2010 12:04:38 PM

## 2010-05-08 ENCOUNTER — Inpatient Hospital Stay (HOSPITAL_COMMUNITY): Payer: Medicare Other

## 2010-05-08 LAB — DIFFERENTIAL
Eosinophils Absolute: 0 10*3/uL (ref 0.0–0.7)
Eosinophils Relative: 0 % (ref 0–5)
Lymphocytes Relative: 29 % (ref 12–46)
Lymphs Abs: 2 10*3/uL (ref 0.7–4.0)
Monocytes Absolute: 0.5 10*3/uL (ref 0.1–1.0)

## 2010-05-08 LAB — PROTIME-INR
INR: 1.17 (ref 0.00–1.49)
Prothrombin Time: 15.1 seconds (ref 11.6–15.2)

## 2010-05-08 LAB — BASIC METABOLIC PANEL
BUN: 7 mg/dL (ref 6–23)
Calcium: 8.1 mg/dL — ABNORMAL LOW (ref 8.4–10.5)
Creatinine, Ser: 0.51 mg/dL (ref 0.4–1.5)
GFR calc non Af Amer: >60 mL/min (ref >60)
Glucose, Bld: 80 mg/dL (ref 70–99)
Potassium: 3.7 mEq/L (ref 3.5–5.1)

## 2010-05-08 LAB — CBC
HCT: 25.2 % — ABNORMAL LOW (ref 39.0–52.0)
MCH: 35.5 pg — ABNORMAL HIGH (ref 26.0–34.0)
MCHC: 34.5 g/dL (ref 30.0–36.0)
MCV: 102.9 fL — ABNORMAL HIGH (ref 78.0–100.0)
Platelets: 173 10*3/uL (ref 150–400)
RDW: 15 % (ref 11.5–15.5)
WBC: 6.8 10*3/uL (ref 4.0–10.5)

## 2010-05-08 LAB — PSA: PSA: 150.21 ng/mL — ABNORMAL HIGH (ref <=4.00)

## 2010-05-08 MED ORDER — IOHEXOL 300 MG/ML  SOLN
100.0000 mL | Freq: Once | INTRAMUSCULAR | Status: AC | PRN
  Administered 2010-05-08: 100 mL via INTRAVENOUS

## 2010-05-08 NOTE — Group Therapy Note (Signed)
  Jermaine [name], Jermaine [name]                ACCOUNT NO.:  [id number]  MEDICAL RECORD NO.:  [id number]           PATIENT TYPE:  I  LOCATION:  A329                          FACILITY:  [facility]  PHYSICIAN:  Wilson Singer, M.D.DATE OF BIRTH:  1931/08/19  DATE OF PROCEDURE:  05/08/2010 DATE OF DISCHARGE:                                PROGRESS NOTE   This man is improving  slowly, I feel.  He appears to be somewhat confused and the patient's nephew was sitting at the bedside today who confirmed this history of being more confused than usual.  He has been started on full anticoagulation again, although I am a little bit hesitant in view of falls, but I think it is worth doing this for the time being.  PHYSICAL EXAMINATION:  VITAL SIGNS:  Temperature 97.9, blood pressure 87/38, pulse 109, and saturation 94% on 1 liter of oxygen. GENERAL:  He looks systemically although cachectic, but does not look toxic or shocked. HEART:  Heart sounds are present and normal. LUNGS:  Lung fields show bilateral crackles in both bases. ABDOMEN:  Soft and nontender. NEUROLOGICAL:  He is alert and does respond, but is rather confused in his response.  He does not appear to have any clear focal neurological signs.  INVESTIGATIONS:  Sodium 136, potassium 3.7, bicarbonate 30, BUN 7, and creatinine 0.51.  Hemoglobin 8.7 macrocytic, white blood cell count 6.8, and platelets 173.  INR today 1.17.  Anemia panel shows a slightly low iron at 19, but saturation is 23% in the normal range.  B12 and folate levels as well as ferritin levels are in the normal range.  His ESR was slightly elevated at 45.  IMPRESSION: 1. Right and left lower lobe pneumonia. 2. Acute pulmonary embolism. 3. Cachexia with compression fractures and left ilium lytic lesion 4. Chronic obstructive pulmonary disease. 5. Alcoholism. 6. Macrocytic anemia. 7. Confusion and altered mental status.  PLAN: 1. Continue anticoagulation. 2. Continue  antibiotics. 3. Await PSA and bone scan. 4. I will do a CT brain scan to make sure we are not dealing with any     kind of malignancy that has metastasized to the brain.  I doubt     this will be the case and I think most of his confusion is related     to his alcoholism, but I think it would be prudent to evaluate for     this.     Wilson Singer, M.D.     NCG/MEDQ  D:  05/08/2010  T:  05/08/2010  Job:  [id number]  Electronically Signed by Lilly Cove M.D. on 05/08/2010 11:15:58 AM

## 2010-05-09 ENCOUNTER — Inpatient Hospital Stay (HOSPITAL_COMMUNITY): Payer: Medicare Other

## 2010-05-09 DIAGNOSIS — R972 Elevated prostate specific antigen [PSA]: Secondary | ICD-10-CM

## 2010-05-09 LAB — LACTATE DEHYDROGENASE: LDH: 138 U/L (ref 94–250)

## 2010-05-09 MED ORDER — TECHNETIUM TC 99M MEDRONATE IV KIT
25.0000 | PACK | Freq: Once | INTRAVENOUS | Status: AC | PRN
  Administered 2010-05-09: 26.5 via INTRAVENOUS

## 2010-05-09 NOTE — Group Therapy Note (Signed)
  NAMEJAAMAL, FAROOQUI                ACCOUNT NO.:  [id number]  MEDICAL RECORD NO.:  [id number]           PATIENT TYPE:  I  LOCATION:  A329                          FACILITY:  [facility]  PHYSICIAN:  Wilson Singer, M.D.DATE OF BIRTH:  01/11/1931  DATE OF PROCEDURE:  05/09/2010 DATE OF DISCHARGE:                                PROGRESS NOTE   This man appears to be somewhat stable.  The PSA result came back extremely elevated at 150.21.  He is due to have a bone scan today.  PHYSICAL EXAMINATION:  Temperature 98.1, blood pressure 109/72, pulse 85, and saturation 99% on 2 liters of oxygen.  There are no new physical findings.  INVESTIGATIONS:  INR is 1.51.  PSA as mentioned above is 150.21.  He did have a CT scan of his brain yesterday in view of his confusion and there was no metastatic disease seen and only atrophy and small vessel disease.  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  IMPRESSION: 1. Bilateral lower lobe pneumonia. 2. Acute pulmonary embolism. 3. Prostate cancer, likely stage IV based on left ilium lytic lesion.     Await bone scan. 4. Chronic obstructive pulmonary disease. 5. Alcoholism. 6. Macrocytic anemia with no deficiencies in B12 or folate, may well     be secondary to alcoholism itself.  PLAN: 1. Await bone scan. 2. I will convert his antibiotics to p.o. and he can discontinue Zosyn     for now. 3. I will get oncology consultation, but I am very dubious as to     whether he is a candidate for any aggressive kind of treatment.     Wilson Singer, M.D.     NCG/MEDQ  D:  05/09/2010  T:  05/09/2010  Job:  [id number]  Electronically Signed by Lilly Cove M.D. on 05/09/2010 05:05:21 PM

## 2010-05-10 DIAGNOSIS — R972 Elevated prostate specific antigen [PSA]: Secondary | ICD-10-CM

## 2010-05-10 LAB — COMPREHENSIVE METABOLIC PANEL
ALT: 12 U/L (ref 0–53)
AST: 21 U/L (ref 0–37)
Alkaline Phosphatase: 110 U/L (ref 39–117)
CO2: 26 mEq/L (ref 19–32)
Calcium: 8.1 mg/dL — ABNORMAL LOW (ref 8.4–10.5)
Chloride: 100 mEq/L (ref 96–112)
GFR calc Af Amer: >60 mL/min (ref >60)
GFR calc non Af Amer: >60 mL/min (ref >60)
Glucose, Bld: 77 mg/dL (ref 70–99)
Potassium: 2.8 mEq/L — ABNORMAL LOW (ref 3.5–5.1)
Sodium: 134 mEq/L — ABNORMAL LOW (ref 135–145)

## 2010-05-10 LAB — DIFFERENTIAL
Basophils Relative: 0 % (ref 0–1)
Eosinophils Absolute: 0 10*3/uL (ref 0.0–0.7)
Eosinophils Relative: 1 % (ref 0–5)
Lymphs Abs: 1.5 10*3/uL (ref 0.7–4.0)
Neutrophils Relative %: 57 % (ref 43–77)

## 2010-05-10 LAB — CBC
HCT: 26.7 % — ABNORMAL LOW (ref 39.0–52.0)
MCH: 35.1 pg — ABNORMAL HIGH (ref 26.0–34.0)
MCV: 101.9 fL — ABNORMAL HIGH (ref 78.0–100.0)
Platelets: 170 10*3/uL (ref 150–400)
RDW: 14.5 % (ref 11.5–15.5)
WBC: 5.5 10*3/uL (ref 4.0–10.5)

## 2010-05-10 LAB — BASIC METABOLIC PANEL
BUN: 4 mg/dL — ABNORMAL LOW (ref 6–23)
Creatinine, Ser: 0.53 mg/dL (ref 0.4–1.5)
GFR calc non Af Amer: >60 mL/min (ref >60)
Glucose, Bld: 110 mg/dL — ABNORMAL HIGH (ref 70–99)
Potassium: 4.2 mEq/L (ref 3.5–5.1)

## 2010-05-11 LAB — BETA 2 MICROGLOBULIN, SERUM: Beta-2 Microglobulin: 1.76 mg/L — ABNORMAL HIGH (ref 1.01–1.73)

## 2010-05-13 LAB — PROTEIN ELECTROPH W RFLX QUANT IMMUNOGLOBULINS
Albumin ELP: 40.7 % — ABNORMAL LOW (ref 55.8–66.1)
Alpha-1-Globulin: 7.9 % — ABNORMAL HIGH (ref 2.9–4.9)
Beta 2: 9.7 % — ABNORMAL HIGH (ref 3.2–6.5)
Beta Globulin: 4.8 % (ref 4.7–7.2)
Gamma Globulin: 22 % — ABNORMAL HIGH (ref 11.1–18.8)

## 2010-05-13 LAB — IMMUNOFIXATION ADD-ON

## 2010-05-13 LAB — IGG, IGA, IGM: IgG (Immunoglobin G), Serum: 1100 mg/dL (ref 700–1600)

## 2010-05-17 NOTE — Discharge Summary (Signed)
Jermaine [name], Jermaine [name]                ACCOUNT NO.:  [id number]  MEDICAL RECORD NO.:  [id number]           PATIENT TYPE:  I  LOCATION:  A329                          FACILITY:  [facility]  PHYSICIAN:  Wilson Singer, M.D.DATE OF BIRTH:  1931/03/22  DATE OF ADMISSION:  05/05/2010 DATE OF DISCHARGE:  05/08/2012LH                              DISCHARGE SUMMARY   FINAL DISCHARGE DIAGNOSES: 1. Bilateral lower lobe pneumonia. 2. Acute pulmonary embolism. 3. Elevated PSA. 4. Lytic lesion in the left ilium 5. Chronic obstructive pulmonary disease . 6. Alcoholism. 7. Microcytic anemia, likely due to alcoholism.  MEDICATIONS ON DISCHARGE: 1. Ciprofloxacin eye drops OU every 4 hours. 2. Thiamine 100 mg daily. 3. Folic acid 1 mg daily. 4. Multivitamins 1 tablet daily. 5. Guaifenesin 600 mg b.i.d. 6. Avelox 400 mg daily for one further week. 7. Ensure 237 mL t.i.d. 8. Nicotine patch 21 mg per day. 9. Coumadin at a dose of 5 mg daily, adjust based on INR.  CONDITION ON DISCHARGE:  Stable.  HISTORY:  This 77 year old man was admitted with a fall.  Please see initial history and physical examination done by Dr. Vedia Coffer.  HOSPITAL PROGRESS:  The patient was admitted and found to have an abnormal chest x-ray, which was further investigated and he was then found to have an acute pulmonary embolism as well as right lower lobe pneumonia, community acquired.  There was a question of whether this was an aspiration pneumonia in view of the patient's alcoholism.  He was treated appropriately with intravenous antibiotics, which were later converted to oral amoxicillin.  In view of an abnormal x-ray of the chest and CT scan of the abdomen, which showed a destructive bony lesion in the left ilium, he underwent a bone scan.  The bone scan did not really show any evidence of osseous metastatic disease.  His PSA, however, was elevated at 150.21.  Oncology had been consulted and they have made  recommendations for prostate biopsy by Urology and follow up with the cancer clinic at [facility].  Today, he looks clinically stable.  He is eating reasonably well.  PHYSICAL EXAMINATION:  VITAL SIGNS:  Temperature 98.6, blood pressure 104/69, pulse 78, saturation 93% on room air. HEART:  Sounds are present and normal. CHEST:  Lung fields are clear anteriorly.  He still remains cachectic as before.  LABORATORY DATA:  Investigations; hemoglobin 9.2 stable, white blood cell count 5.5, platelets 170.  Sodium 134, potassium low at 2.8, bicarbonate 26, BUN 4, creatinine 0.51, albumin 1.7.  INR therapeutic at 2.38.  LDH normal at 138.  He also had a CT head scan because he was intermittently confused and there was no evidence of metastatic disease. There was some atrophy and small-vessel disease.  CT scan of his chest showed the pneumonia and the pulmonary embolism.  DISPOSITION:  This man is now stable to be discharged from the hospital. In view of his history of falls and instability and cachexia, it is highly recommended that he be discharged to a skilled nursing facility. I think, the decision whether to continue Coumadin or not will depend  entirely on his mobility and gait.  I think if he is prone to having further falls then I would discontinue his anticoagulation.  Otherwise, he should be able to continue Coumadin for at least 6 months.  He does need followup with urologist for investigation of his elevated PSA.  The first recommendation would be to have a prostate biopsy done and go from there.  Unfortunately, my suspicion is that his alcoholism will be continued and that further investigations and compliance with medications may be an issue.  The patient today does not wish to go to a skilled nursing facility, which is the safest disposition for this patient.  The patient is unsafe to go home.  However, he is coherent and able to make his own medical decisions and I have  told him that if he does not discharge to a skilled nursing facility today, which an offer of one has already been made in [facility] then he will have to discharge and against medical advice.  We will await his decision.     Wilson Singer, M.D.     NCG/MEDQ  D:  05/10/2010  T:  05/10/2010  Job:  [id number]  Electronically Signed by Lilly Cove M.D. on 05/17/2010 08:14:34 AM

## 2010-05-17 NOTE — Consult Note (Signed)
Jermaine [name], Jermaine [name]                ACCOUNT NO.:  [id number]   MEDICAL RECORD NO.:  [id number]          PATIENT TYPE:  INP   LOCATION:  A316                          FACILITY:  [facility]   PHYSICIAN:  J. Darreld Mclean, M.D. DATE OF BIRTH:  1931/06/24   DATE OF CONSULTATION:  DATE OF DISCHARGE:                                 CONSULTATION   REQUESTING PHYSICIAN:  Tesfaye D. [name], MD   The patient is a 77 year old male with a history of hip pain.  Pictures  show bilateral avascular necrosis, left greater than right.  The patient  is an alcoholic and has a markedly elevated PSA as well.  I have  reviewed the chart and reviewed the history and physical and the notes  by Dr. [name], and they __________ to be repeated.  They are enclosed  by  reference.  The patient complains of pain and tenderness to his hip  according to the records, however, when I went on to talk to the  patient, he says his left knee bothers him a little bit and the x-rays  at the hospital were negative of the knee.  His leg bothers him more  than his hips.  He told me he was in [place].  He served three years  there.  He expects his hips to hurt now and then.  I told him if he had  significant changes to his hips, particularly on the left, he may be a  candidate for possible surgery.  Do not need or want any surgery done.  He was fine.  He says he likes to get up in the morning with a  cigarette, take a little drink, have a nice quiet day.  He did not want  anything done for his hips.  I told him he may change his mind any time.  He says well he may.  His left knee has full extension.  His leg flexes  extend to approximately 100.  He says it is tender.  Motions to his  hips:  Left is somewhat decreased, but I tried to get him to stand.  He  refused to get out of bed.  He said it was warm and comfortable, and he  wanted to stay in bed.  I went to examine the right hip.  He had a  little better motion, but he said that he  preferred I stopped looking at  his legs, and he did not want anything done for them.  I appreciate his  honesty.   IMPRESSION:  1. Bilateral vascular necrosis of the hips, left greater than right.  2. Alcohol abuse.  3. Cigarette smoker.  4. Elevated PSA.   I told him I would be happy to see him in my office if Dr. [name] would  make an appointment for him and that sooner or later his hips would  start bothering him more, and he may want to seriously consider possibly  having something done.           ______________________________  J. Darreld Mclean, M.D.     JWK/MEDQ  D:  02/27/2007  T:  02/27/2007  Job:  [id number]

## 2010-05-17 NOTE — Group Therapy Note (Signed)
NAMEDEVARIOUS, PAVEK NO.:  [id number]   MEDICAL RECORD NO.:  [id number]          PATIENT TYPE:  INP   LOCATION:  A313                          FACILITY:  [facility]   PHYSICIAN:  Catalina Pizza, M.D.        DATE OF BIRTH:  1931/09/23   DATE OF PROCEDURE:  05/05/2007  DATE OF DISCHARGE:                                 PROGRESS NOTE   SUBJECTIVE:  Mr. Silguero is a 77 year old African American gentleman who  has a history of alcohol abuse with bilateral vascular curvature of the  hip who presented with left leg pain and found to have DVT.  He does not  have any complaints at this time and is doing well, just waiting for a  therapeutic INR.   OBJECTIVE:  VITAL SIGNS: Temperature is 97.6, blood pressure 107/55,  pulse 63, and respirations 20.  GENERAL: This is an elderly African American gentleman lying in bed, in  no acute distress.  HEENT: Unremarkable.  LUNGS: Clear to auscultation bilaterally.  No rhonchi or wheezing.  CARDIOVASCULAR: Regular rate and rhythm.  No murmurs, gallops, or rubs.  ABDOMEN: Soft, nontender, and nondistended.  Positive bowel sounds.  EXTREMITIES: Very minimal swelling in left lower extremity.  Mild pain  to palpation in the left calf area, but minimal.  I do not appreciate  any cords at this time.  NEUROLOGIC: Alert and oriented x3.  No deficits noted.   LABORATORY DATA:  PT is 23.0 and INR of 1.9.   IMPRESSION:  A 77 year old African American gentleman with left deep  venous thrombosis.   ASSESSMENT/PLAN:  Acute deep venous thrombosis on left lower extremity.  Awaiting therapeutic INR dose by pharmacy, still covered with heparin at  this time.   Anemia of chronic disease, unknown exact cause of this, question related  to alcoholism, but we will recheck another CBC in the morning.   DISPOSITION:  Awaiting therapeutic PT/INR and likely we will be able to  discharge tomorrow once therapeutic.  The patient will be seen in  followup by Dr.  [name].      Catalina Pizza, M.D.  Electronically Signed     ZH/MEDQ  D:  05/05/2007  T:  05/05/2007  Job:  [id number]

## 2010-05-17 NOTE — Discharge Summary (Signed)
NAMEMOE, GRACA NO.:  [id number]   MEDICAL RECORD NO.:  [id number]          PATIENT TYPE:  INP   LOCATION:  A332                          FACILITY:  [facility]   PHYSICIAN:  Vickki Hearing, M.D.DATE OF BIRTH:  1931/07/11   DATE OF ADMISSION:  02/12/2008  DATE OF DISCHARGE:  02/26/2010LH                               DISCHARGE SUMMARY   ADMISSION DIAGNOSES:  Left trochanteric hip fracture.   DISCHARGE DIAGNOSIS:  1. Left Trochanteric hip fracture.  2. Ventricular tachycardia.  3. Alcohol withdrawal.  4. Postop anemia.  5. Postop wound cellulitis.  6. Postoperative wound hematoma.  7. Postoperative seroma.  8. Postoperative hypotension.   HISTORY OF PRESENT ILLNESS:  This is a 77 year old male who lives alone  who uses a cane to walk.  He fell on February 9 and came to the  emergency room on February 10 complaining of left hip pain and inability  to walk.  An x-ray showed a trochanteric fracture of left hip, he was  admitted to the hospital.  Medical consult was obtained with Dr. Loreta Ave,  and the patient was going to undergo surgery February 12, and he  underwent open treatment internal fixation of the left hip with a 95  degrees condylar screw.   OPERATIVE FINDINGS:  Fracture was impacted and was not producible by  closing the fracture and it had to be open and changed our plan from  intramedullary nailing device to a conduit screw and plate device.  The  fracture was bone-grafted using the [employer] Omega hip set with 6 hole  plate with 161 mL blood loss.  This was done under spinal anesthetic  with excellent reduction.  The patient was brought to the recovery room.   In the recovery room, the patient went into tachy arrhythmia.  He was  admitted to the ICU.  Cardiology consult was obtained and he was started  on Lopressor and given blood transfusion to improve his hemoglobin which  dropped to over 8.5.  Over the next several days, he developed a wound  hematoma.  Lovenox was held.  He was allowed to use foot pumps for DVT  prevention and mobilization bed to chair.  He received a total of 4  units of blood and hemoglobin on the 24th was 10.0.   His hospital course was also marked by alcohol withdrawal which was  treated with a standard protocol.  He was then transferred to the floor  where he was unable to maintain toe-touch weightbearing status and had  to essentially be mobilized bed to chair with minimal assist.   He resolved the alcohol withdrawal and became lucid and  did well except  for cellulitis and hematoma of his wound which was treated with Levaquin  dressing changes, and Levaquin was taken from February 19 to 25, and  this resolved the cellulitis.  He still has a seroma of the left hip  which we are treating with dressing changes and Surgicel under the wound  to control the bleeding.   DISCHARGE MEDICATIONS:  1. Digoxin 0.125 mg orally daily.  2.  Folic acid 1 mg daily.  3. Lopressor 12 mg p.o. t.i.d.  4. Multivitamin one a day.  5. Thiamine 100 mg a day.  6. Dulcolax suppository 10 mg per rectum daily p.r.n. for      constipation.  7. Vicodin 5/500 one q.4 h p.r.n. for pain.  8. Milk of Magnesia 30 mL orally daily p.r.n. for constipation.   LABORATORY STUDIES:  As follows, the patient had thyroid stimulating  hormone on February 26, 2008, result 1.296 which was in normal range.  He had a superficial wound culture which had multiple organisms, non  predominant and was deemed insufficient.  Basic metabolic panel February  24, potassium was 4.2, sodium 135, glucose 76, BUN 4.   He also had a C diff toxin on the 19th which was negative.  He had an  echocardiogram on February 15 which showed ejection fraction 50-55%, low  normal left ventricular systolic function and aortic valve, mild  regurgitation and calcification and mild mitral valve regurgitation.   DISPOSITION:  He is discharged to [facility].   WEIGHTBEARING  STATUS:  Toe-touch.   WOUND MANAGEMENT:  Please place a Surgicel over the area of drainage  followed by 4x4 and then an ABD and tape.   FOLLOWUP:  March 4, please call [phone] to schedule appointment.      Vickki Hearing, M.D.  Electronically Signed     SEH/MEDQ  D:  02/28/2008  T:  02/28/2008  Job:  [id number]

## 2010-05-17 NOTE — Group Therapy Note (Signed)
NAMEDAREY, HERSHBERGER NO.:  [id number]   MEDICAL RECORD NO.:  [id number]          PATIENT TYPE:  INP   LOCATION:  IC07                          FACILITY:  [facility]   PHYSICIAN:  Vickki Hearing, M.D.DATE OF BIRTH:  1931/04/27   DATE OF PROCEDURE:  02/15/2008  DATE OF DISCHARGE:                                 PROGRESS NOTE   Postop day 1, status post open treatment, internal fixation of  subtrochanteric fracture, left femur.  The patient had a hemoglobin of  8.5 in the PACU yesterday and runs of tachycardia in the 140s.  He had  blood pressure which dropped to 77/50.  He was given a unit of blood and  is most likely to get another one today.  His potassium had dropped to  3.3 and that will be corrected as well.  He has some mild swelling which  is expected and mild wound drainage, but nothing excessive.  Recommend  bed-to-chair if tolerated.  Nonweightbearing on the left leg.  Correct  electrolyte and hemoglobin abnormalities.      Vickki Hearing, M.D.  Electronically Signed     SEH/MEDQ  D:  02/15/2008  T:  02/15/2008  Job:  [id number]

## 2010-05-17 NOTE — Op Note (Signed)
NAMEMIDAS, DAUGHETY NO.:  [id number]   MEDICAL RECORD NO.:  [id number]          PATIENT TYPE:  INP   LOCATION:  IC07                          FACILITY:  [facility]   PHYSICIAN:  Vickki Hearing, M.D.DATE OF BIRTH:  1931-12-28   DATE OF PROCEDURE:  02/14/2008  DATE OF DISCHARGE:                               OPERATIVE REPORT   DATE OF INJURY:  February 11, 2008.   HISTORY:  A 77 year old male fell on February 11, 2008, got up, was able  to walk, went to bed, could not get up, called the EMS, was brought to  the hospital.  Workup revealed a fractured left hip, he was admitted,  had a Medical consult preop by Dr. [name], and was cleared for surgery.   PREOPERATIVE DIAGNOSIS:  Intertrochanteric fracture, left hip.   POSTOPERATIVE DIAGNOSIS:  Subtrochanteric fracture, left hip.   SURGEON:  Vickki Hearing, MD   ASSISTANT:  None.   PROCEDURE:  Open treatment internal fixation, left hip with a 95-degree  condylar screw with 6-hole plate, cancellus bone chips from the striker  Omega hip set.   BLOOD LOSS:  400 mL.   ANESTHESIA:  Spinal.   COMPLICATIONS:  None.   COUNTS:  Correct.   PROCEDURE:  The patient was placed and was given Ancef, site marking was  performed.  History versus physical were updated.  The patient was taken  to the surgical suite, had a spinal anesthetic, was placed on the  fracture table.  Closed manipulation was attempted, but was unsuccessful  as the fracture was apex anterior angulated on the lateral x-rays, and  despite traction, internal and external rotation fracture was found to  be impacted.   The left lower extremity was prepped with DuraPrep, draped sterilely,  and the time-out procedure was completed.   The fracture was then opened and reduced in the following manner.  Straight incision was made over the proximal femur and trochanter and  carried down to the fascia.  The fascia was split in line with the skin  incision  and the vastus lateralis was dissected from the proximal femur  and the fracture site was opened.  Several attempts with blunt  instrument were used to reduce the fracture, but it was unsuccessful.  The fracture was opened and manually reduced and held with a bone clamp.  Radiographs confirmed the reduction and a 95-degree condylar angle guide  was used to place a guide pin.  The pin was placed, confirmed to be in  good position on AP and lateral x-rays, measured to be an 80 mm pin.  The ream tripling was set for 80 mm, and the pin was over reamed.  A 6-  hole plate was applied using AO technique, and then bone chips were  placed at the fracture site using cancellus allograft chips.   The wound was closed after copious amounts of irrigation with 0 Monocryl  in two layers and 2-0 Monocryl in one layer, skin staples were used to  reapproximate the skin edges with 30 mL of Marcaine was injected on the  subcu  and subfascial layers.   At the end of the procedure with the patient on a regular bed, his  length was checked and it was normal and his rotation was normal.  Sterile dressing was applied before taking the patient off the fracture  table.  The patient was taken to recovery room in stable condition where  he will be monitored and sent to the ICU.   He did have some intermittent runs of tachycardia in the 130s and 140s.  No blood pressure loss with this, no saturation changes.  He will be  nonweightbearing for 12 weeks.      Vickki Hearing, M.D.  Electronically Signed     SEH/MEDQ  D:  02/14/2008  T:  02/15/2008  Job:  [id number]

## 2010-05-17 NOTE — Procedures (Signed)
[name], Jermaine [name]                ACCOUNT NO.:  [id number]   MEDICAL RECORD NO.:  [id number]         PATIENT TYPE:  PINP   LOCATION:  A332                          FACILITY:  [facility]   PHYSICIAN:  Jermaine [name], M.D. DATE OF BIRTH:  [birth year]   DATE OF PROCEDURE:  DATE OF DISCHARGE:                              EEG INTERPRETATION   This is a 77 year old man who presents with seizures.   MEDICATIONS:  Lovenox, Ativan, and Zofran.   ANALYSIS:  A 16-channel recording is conducted for approximately 20  minutes.  There is a well-formed posterior dominant rhythm of 12 Hz,  which attenuates with eye opening.  There is beta activity observed in  the frontal areas.  Awake and drowsy activities are recorded.  Photic  stimulation is carried out without significant changes in background  activity.  There is no focal or lateralized slowing.  There is no  epileptiform activity.   IMPRESSION:  This is a normal recording of awake and drowsy states.  A  single recording does not rule out the epileptic seizures if clinically  indicated a prolonged or sleep deprived recording may be useful.      Jermaine [name], M.D.  Electronically Signed     KAD/MEDQ  D:  08/11/2008  T:  08/11/2008  Job:  [id number]

## 2010-05-17 NOTE — Group Therapy Note (Signed)
NAMELAMAR, METER NO.:  [id number]   MEDICAL RECORD NO.:  [id number]          PATIENT TYPE:  INP   LOCATION:  A313                          FACILITY:  [facility]   PHYSICIAN:  Catalina Pizza, M.D.        DATE OF BIRTH:  1931-08-27   DATE OF PROCEDURE:  05/04/2007  DATE OF DISCHARGE:                                 PROGRESS NOTE   SUBJECTIVE:  Jermaine [name] is a 77 year old African gentleman who has history  of alcohol abuse and bilateral vascular necrosis of the hip who  presented with left leg pain and swelling related to that was found to  be a left DVT.  He denies any pain at this time and has been ambulating  with physical therapy and awaiting therapeutic INR.   OBJECTIVE:  VITAL SIGNS:  Temperature is 97.6, blood pressure 103/60,  pulse 64, respirations 20, saturating 99% on room air.  GENERAL:  This is an elderly African gentleman lying bed in no acute  distress, very talkative.  HEENT:  Unremarkable.  Pupils equal, round, react to light and  accommodation.  LUNGS:  Clear to auscultation bilaterally.  No rhonchi or wheezing.  CARDIOVASCULAR:  Regular rate and rhythm.  No murmurs, gallops or rubs.  ABDOMEN:  Soft, nontender, positive bowel sounds.  EXTREMITIES:  Swelling in the left lower extremity is much improved,  very minimal at this time.  No significant tenderness in the calf any  longer.   Laboratory data today showed white count 6.4, hemoglobin 9.2, platelet  count of 388.  PT is 21.0, INR of 1.7.  Heparin level 0.50.   IMPRESSION:  This is a 77 year old African gentleman with left deep vein  thrombosis.   ASSESSMENT/PLAN:  1. Acute DVT of left lower extremity.  Awaiting therapeutic INR, still      on heparin for cross-coverage as well as Coumadin.  INR is      increasing and is being followed by pharmacy.  2. Anemia.  Previous diagnosis anemia of chronic disease, unknown      exact cause of this but has remained stable even on blood thinner.  3. History  of alcohol abuse.  Started on thiamine and folic acid and      will continue this.   DISPOSITION:  Just awaiting PT/INR to get therapeutic.  May be on  Monday, once therapeutic.      Catalina Pizza, M.D.  Electronically Signed     ZH/MEDQ  D:  05/04/2007  T:  05/04/2007  Job:  [id number]

## 2010-05-17 NOTE — Group Therapy Note (Signed)
NAMESHLOK, RAZ NO.:  [id number]   MEDICAL RECORD NO.:  [id number]         PATIENT TYPE:  PINP   LOCATION:  A332                          FACILITY:  [facility]   PHYSICIAN:  Vickki Hearing, M.D.DATE OF BIRTH:  06-20-1931   DATE OF PROCEDURE:  DATE OF DISCHARGE:                                 PROGRESS NOTE   This is Dr. Romeo Apple dictating on Dr. [name], who is now in  [facility] bed 147 window.  I was called yesterday to evaluate  serous drainage from his left hip incision.   The wound care nurse assessed the wound as frank bright red bleeding,  which was a change in his condition at the time of discharge.  She also  probed the wound with a Q-tip.   I came in today on the 27th to evaluate the wound.  There was serous  drainage on the dressing.  There was a dribble of serous fluid from the  wound.  This is consistent with what we have seen in the last 7 days or  so in the hospital.  The patient has a post hematoma, seroma, which has  drained.  The patient's labile cardiac status has made me hesitant to  take him back to Surgery, and we are trying to manage this  nonoperatively.  We would expect it to dry up over time.  He has no  signs of infection on the wound.  A piece of Surgicel was placed over  the area and it was dressed with a pressure dressing.  I will continue  to evaluate.      Vickki Hearing, M.D.  Electronically Signed     SEH/MEDQ  D:  02/29/2008  T:  02/29/2008  Job:  [id number]

## 2010-05-17 NOTE — Consult Note (Signed)
[name], [name] NO.:  [id number]   MEDICAL RECORD NO.:  [id number]          PATIENT TYPE:  INP   LOCATION:  IC07                          FACILITY:  [facility]   PHYSICIAN:  Gerrit Friends. Dietrich Pates, MD, FACCDATE OF BIRTH:  [birth year]   DATE OF CONSULTATION:  02/17/2008  DATE OF DISCHARGE:  09/12/2007                                 CONSULTATION   REFERRING PHYSICIAN:  Tesfaye D. [name], MD.   REASON FOR CONSULTATION:  Tachy arrhythmia.   HISTORY OF PRESENT ILLNESS:  Jermaine [name] is a 77 year old male with a  history of avascular necrosis of the bilateral hips.  He was admitted  with left hip fracture on February 12, 2008.  He underwent ORIF by Dr.  Romeo Apple on February 12.  Initially he did well postoperative.  He did  require transfusion with packed red blood cells x2 units.  He did  develop confusion, hypotension, and tachycardia.  He does have a history  of alcohol abuse, and was placed on DT protocol.  His heart rate has  been up into the 150s at times.   On February 15, 2008 he did receive orders from the [facility] physician for  adenosine 6 mg IV push x1.  The strips from that event are unavailable  at the time.  We are now asked to further evaluate for his tachy  arrhythmia.   The patient is a poor historian.  He does deny any chest pain or  shortness of breath.  He denies of any history of syncope.  He denies  any history of palpitations or SVT.  He currently is being treated with  a Diltiazem drip at 7.5 mg per hour.  We are now asked to further  evaluate.   PAST MEDICAL HISTORY:  Given as that above.  In addition:  1. Status post right inguinal herniorrhaphy.  2. History of bilateral hip avascular necrosis.  3. History of alcohol abuse.  4. History of left lower extremity deep vein thrombosis, in June 2009.  5. History of anemia of chronic disease.   MEDICATIONS AT HOME:  None.   ALLERGIES:  No known drug allergies.   SOCIAL HISTORY:  The patient lives at  an assisted living facility in  [place].  He has 100+ pack a year smoking.  He continues to smoke  cigarettes.  He has a prior history of alcohol abuse, and reports  quitting in 2009.   FAMILY HISTORY:  Significant for coronary artery disease.  His mother  and father both died from myocardial infarctions.   REVIEW OF SYSTEMS:  These are somewhat limited.  He denies chest pain,  shortness of breath, orthopnea, syncope, near-syncope, cough, dysuria,  bright red blood per rectum or melena, hematemesis or dysphagia.  The  rest of the review of systems are negative.   PHYSICAL EXAMINATION:  He is a well nourished and well developed male;  no acute distress.  Blood pressure 106/76, pulse 93, respirations 16,  temperature 97.8, oxygen saturation 100% on 3 liters.  Ins and outs:  3250 in and 1775 out.  HEENT:  Normocephalic  and atraumatic.  NECK:  Supple without lymphadenopathy.  ENDOCRINE:  Negative.  CARDIAC:  Normal S1 and S2.  Regular rate and rhythm without murmur.  LUNGS:  Clear to auscultation bilaterally anteriorly.  SKIN:  Without rash.  ABDOMEN:  Soft with normal active bowel sounds.  EXTREMITIES:  Without clubbing, cyanosis or edema.  MUSCULOSKELETAL:  Without joint deformity.  NEUROLOGIC:  He is somewhat lethargic.  Cranial nerves II-XII were  grossly intact.  VASCULAR EXAMINATION:  Without carotid bruits bilaterally.   CHEST X-RAY:  On February 12, 2008, demonstrated COPD but did not get  today's EKG.  From February 15, 2008 demonstrated sustained ventricular  tachycardia with a heart rate of 153, left axis deviation and no acute  changes.   LABS:  White count 7300, hemoglobin 9.5, MCV 91.9, platelet count  174,000.  Sodium 132, potassium 3.5, BUN 3, creatinine 0.52, glucose  108.  TSH 0.743.  INR 1.0.   ASSESSMENT:  1. Tachy arrhythmia.  2. Status post open reduction internal fixation of the left hip,      secondary to fracture.  The patient with a known history  of      avascular necrosis.  3. Postoperative anemia, status post transfusion with 2 units of      packed red blood cells, in a patient with known history of anemia      of chronic disease.  4. Chronic obstructive pulmonary disease with ongoing tobacco abuse.  5. History of alcohol abuse, with episodes of confusion.  Now on      delirium tremens protocols.  6. Deep vein thrombosis in 2009.   RECOMMENDATIONS:  The patient was also interviewed and examined by Dr.  Dietrich Pates.  He is not clearly symptomatic with his SVT.  Mechanism is  clearly uncertain and the duration is unknown.  He has no known history  of cardiac disease.  His SVT has slowed with IV Diltiazem, but continues  to be present most of the time.  Possible mechanisms include:  Automatic  atrial tachycardia intra-atrial reentry of 3 types of long RP type  tachycardia.  His  rhythm is possibly being exacerbated by his physiologic stress.  Diltiazem will be discontinued and beta blocker will be initiated.  His  TSH is currently normal.  An echocardiogram is currently pending.   Thanks very much for the consultation.  We will be glad to follow the  patient.      Tereso Newcomer, PA-C      Gerrit Friends. Dietrich Pates, MD, [facility]  Electronically Signed    SW/MEDQ  D:  02/17/2008  T:  02/17/2008  Job:  [id number]   cc:   Tesfaye D. [name], MD  Fax: [phone]

## 2010-05-17 NOTE — H&P (Signed)
Jermaine [name], Jermaine [name]                ACCOUNT NO.:  [id number]   MEDICAL RECORD NO.:  [id number]          PATIENT TYPE:  INP   LOCATION:  A313                          FACILITY:  [facility]   PHYSICIAN:  Tesfaye D. [name], MD   DATE OF BIRTH:  [birth year]   DATE OF ADMISSION:  04/30/2007  DATE OF DISCHARGE:  LH                              HISTORY & PHYSICAL   CHIEF COMPLAINT:  Left lower extremity swelling and pain.   HISTORY OF PRESENT ILLNESS:  This is a 77 year old male patient who has  history of alcohol abuse and bilateral vascular necrosis of the hip was  brought to the office from local rest home.  Patient was in his usual  state of health until 2 days before admission when the patient started  noticing progressively worsening swelling of his left lower extremity.  The swelling is associated with pain.  Patient has been on wheelchair  due to history of vascular necrosis of the hip.  He has been trying to  ambulate with the physical therapy.  However, no history of accident and  fall or a trauma recently.  Patient was evaluated in the office and  urgent ultrasound of the lower extremity was done which showed an acute  deep vein thrombosis in both common femoral veins.  Patient was then  admitted directly for anticoagulation and further treatment.   REVIEW OF SYSTEMS:  No fever, chills, cough, chest pain, nausea,  vomiting, abdominal pain, dysuria, urgency or frequency of urination.   PAST MEDICAL HISTORY:  1. History of bilateral vascular necrosis of the hips.  2. History of alcohol abuse.  3. Elevated PSA.  4. History of hyperkalemia.   CURRENT MEDICATIONS:  1. Tamen 100 mg p.o. daily.  2. Folic acid 1 mg daily.  3. Lortab 5/500 one tablet p.o. every 6 hours p.r.n.   SOCIAL HISTORY:  Patient is single.  He lives in a rest home.  Patient  has a history of longstanding tobacco and alcohol abuse.  No history of  substance abuse.   PHYSICAL EXAMINATION:  Patient is alert, awake  and rather sick looking.  VITAL SIGNS:  Blood pressure 97/62.  Pulse 122.  Respiratory rate 20.  Temperature 99 degrees Fahrenheit.  HEENT:  Pupils are regular reactive.  NECK:  Supple.  CHEST:  Clear on auscultation with good air entry.  CARDIOVASCULAR:  First and second heart sound heard.  No murmur, no  gallop.  ABDOMEN:  Soft and bowel sound is positive.  No area of tenderness.  EXTREMITIES:  Patient has a diffuse swelling of the left lower extremity  extending from his ankle to the hip area.  There is tenderness in the  calf.   LABORATORIES ON ADMISSION:  Sodium 135, potassium 3.8, chloride 103,  carbon dioxide 28, glucose 112, BUN 8, creatinine 0.7 and calcium 8.5.  CBC:  WBC 7.3, hemoglobin 9.3, hematocrit 29.0 and platelet 316.  PT  13.4, INR 1.0 and PTT 45.   ASSESSMENT:  1. Acute deep vein thrombosis of left lower extremity.  2. Anemia of chronic disease.  3. History  of alcohol abuse.  4. History of bilateral hip vascular necrosis.  5. History of evaluated PSA.   PLAN:  Will continue patient on a heparin drip.  Will also start on a  Coumadin and continue to monitor his PT/INR.  Will continue regular  medications.  Once patient is fully anticoagulated, we will discharge  him back to his rest home.      Tesfaye D. [name], MD  Electronically Signed     TDF/MEDQ  D:  05/01/2007  T:  05/01/2007  Job:  [id number]

## 2010-05-17 NOTE — H&P (Signed)
Jermaine [name], Jermaine [name]                ACCOUNT NO.:  [id number]   MEDICAL RECORD NO.:  [id number]          PATIENT TYPE:  INP   LOCATION:  A316                          FACILITY:  [facility]   PHYSICIAN:  Tesfaye D. [name], MD   DATE OF BIRTH:  19-Mar-1931   DATE OF ADMISSION:  02/26/2007  DATE OF DISCHARGE:  LH                              HISTORY & PHYSICAL   CHIEF COMPLAINT:  Difficult to walk.   HISTORY OF PRESENT ILLNESS:  This is a 77 year old male patient was  brought from home due to the above complaints.  The patient claims he  fell and hurt his left lower extremity.  The patient was evaluated in  the emergency room, and he was found to be unable to ambulate.  His x-  ray was reported to show avascular necrosis of the hip.  However, the  patient claims he fell and hurt his left lower extremity.  The x-ray  showed more worsening of necrosis on the left femoral he had greater  than the right.  The patient also has a significant hypokalemia.  The  patient was recently seen in the office after several years for  complaint of loss of weight and shortness of breath.  He had a blood  test which showed a significantly elevated PSA greater than 50 with  impression of possible CA of the prostate.  Appointment was done with  urology.  However, patient missed his urology follow-up.  He is known to  have a history of alcohol abuse, and on admission his alcohol level was  104 mg/dL.   REVIEW OF SYSTEMS:  The patient complains of pain in his lower  extremities and generalized weakness.  No fever, headache, nausea,  vomiting, abdominal pain, dysuria, urgency or frequency of urination.   PAST MEDICAL HISTORY:  1. Elevated PSA.  2. Alcohol and tobacco abuse.  3. Weight loss.   CURRENT MEDICATIONS:  The patient is not on any prescription medicines  at this time.   SOCIAL HISTORY:  The patient is single.  He has a history of  longstanding alcohol and tobacco abuse.  No history of substance  abuse.   PHYSICAL EXAMINATION:  GENERAL:  The patient is alert, awake,  chronically sick-looking.  VITAL SIGNS:  Blood pressure 126/66, pulse 81, respiratory rate 20,  temperature 98.7 degrees Fahrenheit.  HEENT:  Pupils are equal and reactive.  NECK:  Supple.  CHEST:  Clear, lung fields good air entry.  CARDIOVASCULAR:  First and second heart sounds heard.  No murmur, no  gallop.  ABDOMEN:  Soft and lax.  Bowel sounds positive.  EXTREMITIES:  The patient has tenderness in his left knee as well as hip  area.  It is difficult to move his hip.   LABORATORY DATA ON ADMISSION:  Sodium 132, potassium 2.9, chloride 87,  carbon dioxide 31, glucose 91, BUN 7, creatinine 0.7.  Calcium 8.1.  Alcohol 104.  ABG at room air:  pH 7.52, pCO2 34, pO2 45, saturation  80%.  CBC:  WBC 4.8, hemoglobin 11.1, hematocrit 31.4, and platelets  142.  ASSESSMENT:  1. Avascular necrosis of the hip left greater than right according to      x-ray report.  2. Hypokalemia.  3. Elevated PSA to rule out cancer of the prostate with a possible      bone metastasis.  4. Alcohol abuse.  5. Nicotine addiction.   PLAN:  Will do a bone scan.  Will repeat PSA.  We will supplement  potassium.  Will do an orthopedics consult.  Will start the patient on  thiamine and folic acid, and will continue to monitor his BMP.      Tesfaye D. [name], MD  Electronically Signed     TDF/MEDQ  D:  02/26/2007  T:  02/26/2007  Job:  [phone] [id number]

## 2010-05-17 NOTE — Discharge Summary (Signed)
Jermaine [name], Jermaine [name]                ACCOUNT NO.:  [id number]   MEDICAL RECORD NO.:  [id number]          PATIENT TYPE:  INP   LOCATION:  A316                          FACILITY:  [facility]   PHYSICIAN:  Tesfaye D. [name], MD   DATE OF BIRTH:  08-03-1931   DATE OF ADMISSION:  02/25/2007  DATE OF DISCHARGE:  03/04/2009LH                               DISCHARGE SUMMARY   DISCHARGE DIAGNOSES:  1. Avascular necrosis of the hips bilaterally.  2. Elevated prostate specific antigen.  3. Hypokalemia.  4. History of alcohol abuse.   DISCHARGE MEDICATIONS:  1. Thiamine 100 mg p.o. daily.  2. Folic acid 1 mg daily.  3. Nicotine patch 25 1 mg daily.  4. Vicodin 7.5/3.325 1 tablet p.o. daily.  5. Atrovent and Proventil nebulizer q.4 h. p.r.n.  6. OxyIR 5 mg p.o. q.4 hours p.r.n. for pain.   DISPOSITION:  The patient will be discharged to a skilled nursing home.   HOSPITAL COURSE:  This is 77 year old male patient with a history of  chronic alcohol abuse who was brought to the emergency room due to  difficult to walk.  The patient claims he fell at home and hurt his left  knee.  The patient was evaluated in emergency room, and x-ray showed  bilateral necrosis of the hip, left was greater than the right.  There  was no sign of injury on his knee.  The patient was admitted and  orthopedics consult was done.  The patient initially declined to have  any surgery.  His PSA also was found to be very high.  Urology consult  was also done and advised to make a followup in 2 weeks for further  workup.  The patient remained very deconditioned and unable to walk.  He  has no support at home.  Arrangement is being made for transfer to  nursing home, for skilled nursing care.  He needs to be followed with  orthopedics, Dr. Hilda Lias and urology with Dr. Rito Ehrlich.      Tesfaye D. [name], MD  Electronically Signed     TDF/MEDQ  D:  03/06/2007  T:  03/06/2007  Job:  [id number]

## 2010-05-17 NOTE — H&P (Signed)
Jermaine [name], Jermaine [name] NO.:  [id number]   MEDICAL RECORD NO.:  [id number]          PATIENT TYPE:  INP   LOCATION:  IC12                          FACILITY:  [facility]   PHYSICIAN:  Vickki Hearing, M.D.DATE OF BIRTH:  Feb 18, 1931   DATE OF ADMISSION:  DATE OF DISCHARGE:  LH                              HISTORY & PHYSICAL   CHIEF COMPLAINT:  Left hip pain.   HISTORY:  He is 77 year old.  He lives alone.  He uses a cane to walk.  He fell on February 9.  Came to the emergency room via EMS on February  10 complaining of inability to walk.  He complains of inability to  weight bear on his left hip.  He simply fell at home.  There was no loss  of consciousness.  He complained of 9/10 pain over the left groin and  hip.   His review of systems is completely normal.   His medical history is negative.   He says he takes no medications.   He has no allergies.   He says he does not drink.  He smokes one pack every 2 days.  He says he  has no home medications.   His family physician is Dr. [name].   PHYSICAL EXAMINATION:  VITAL SIGNS:  Temp 98, pulse 139, respiratory  rate 16, blood pressure 127/86.  His sat is 99% on 2 liters.  Other constitutional finding include that he is very thin, normal  development, normal grooming, and hygiene.   From a cardiovascular standpoint, he has normal pulses in feet and in  the upper extremities.  No tenderness or edema.  His heart rate of  course is rapid and appears regular.   He has no lymph nodes in the cervical or supraclavicular or axillary  regions.  No groin nodes are palpable.   The skin is warm, dry, and intact.  No rashes, lesions, or nodules.   His neurological status, he is awake, alert, and oriented x3 with no  focal findings.   He has upper extremities with no contracture, subluxation, atrophy, or  tremor with normal strength.   His right lower extremity has normal range of motion in the hip, knee,  and ankle.   Normal strength, stability, and alignment in the right lower  extremity.   His left lower extremity is held in external rotation with about 90  degrees knee flexion, and he is tender to any movement in the left leg.   Radiographs show a left intertrochanteric hip fracture, which is  nondisplaced.  However, the frog-leg lateral is a poor film and there is  no cross table lateral x-ray.   He has the following labs;  He has O positive blood available, which expires on February 13.  His  EKG shows a septal infarct, age indeterminate.  He will get some  laboratory studies on him and plan for internal fixation of his left hip  on Friday.  Consult pending with Dr. [name] for Thursday.      Vickki Hearing, M.D.  Electronically Signed     SEH/MEDQ  D:  02/13/2008  T:  02/13/2008  Job:  [id number]

## 2010-05-17 NOTE — H&P (Signed)
NAMEPHARAOH, PIO                ACCOUNT NO.:  [id number]   MEDICAL RECORD NO.:  [id number]          PATIENT TYPE:  INP   LOCATION:  A332                          FACILITY:  [facility]   PHYSICIAN:  Tesfaye D. [name], MD   DATE OF BIRTH:  December 31, 1931   DATE OF ADMISSION:  08/09/2008  DATE OF DISCHARGE:  LH                              HISTORY & PHYSICAL   CHIEF COMPLAINT:  Syncopal episode.   HISTORY OF PRESENT ILLNESS:  This is a 77 year old male patient with  history of alcohol abuse, who came to emergency room with the above  complaints.  The patient lives alone and has been drinking alcohol  heavily.  On the day of admission, the patient initially felt dizzy and  then he passed out.  The patient was brought to emergency room where he  was evaluated.  His routine labs and x-rays were within the normal  limits.  The patient was admitted for further evaluation.   REVIEW OF SYSTEMS:  The patient complains of pain in his hip and knee  area.  No history of headache, fever, chills, cough, shortness of  breath, chest pain, nausea, vomiting, abdominal pain, dysuria, urgency  or frequency of urination.   PAST MEDICAL HISTORY:  1. History of left hip fracture.  2. Alcohol abuse.  3. Postoperative anemia.  4. History of postoperative wound cellulitis and hematoma.  5. History of alcohol withdrawal syndrome.  6. History of deep venous thrombosis of the left lower extremities.  7. Bilateral hip avascular necrosis.  8. Elevated PSA.   CURRENT MEDICATIONS:  The patient is not taking any medicine.   SOCIAL HISTORY:  The patient is currently living alone at home.  He  drinks alcohol heavily.  The patient has not been seen in the office for  several months and is not taking his regular medications.   PHYSICAL EXAMINATION:  GENERAL:  The patient is alert, awake,  emancipated, and chronically-sick looking.  VITAL SIGNS:  Blood pressure 128/89, pulse 88, respiratory rate 18, and  temperature 98.4  degrees Fahrenheit.  HEENT:  Pupils are equal and reactive.  NECK:  Supple.  CHEST:  Clear lung fields, good air entry.  CARDIOVASCULAR SYSTEM:  First and second heart sounds heard.  No murmur.  No gallop.  ABDOMEN:  Soft and lax.  Bowel sounds positive.  No mass or  organomegaly.  EXTREMITIES:  The patient has significant muscle mass loss.  He is  emancipated and undernourished.   LABS ON ADMISSION:  Myoglobin 21, troponin less than 0.05, CK-MB 1.2, D-  dimer is 1, and alcohol level is 141.  Sodium 139, potassium 4.0,  chloride 102, carbon dioxide 28, glucose 74, BUN 10, creatinine 0.8.  CBC, WBC is 3.4, hemoglobin 30.7, hematocrit 39.3, and platelets 179.   ASSESSMENT:  1. Syncopal episode.  2. Alcohol abuse.  3. Protein-calorie malnutrition.  4. History of avascular necrosis bilaterally.  5. History of deep venous thrombosis.  6. History of left hip fracture and status post hip replacement.   PLAN:  We will admit the patient under telemetry.  We  will do carotid  Doppler and echo and EEG.  We will do pre-question for alcohol  withdrawal.  We will start the patient on thiamine and folic acid.  We  will do physical therapy and social service evaluation, and will  continue supportive care.      Tesfaye D. [name], MD  Electronically Signed     TDF/MEDQ  D:  08/11/2008  T:  08/11/2008  Job:  [id number]

## 2010-05-17 NOTE — Group Therapy Note (Signed)
[name], Jermaine [name] NO.:  [id number]   MEDICAL RECORD NO.:  [id number]          PATIENT TYPE:  INP   LOCATION:  A322                          FACILITY:  [facility]   PHYSICIAN:  Vickki Hearing, M.D.DATE OF BIRTH:  [birth year]   DATE OF PROCEDURE:  DATE OF DISCHARGE:                                 PROGRESS NOTE   He is status post open treatment and internal fixation left hip with 95-  degree condylar screw for pertrochanteric fracture.  He has had a postop  hematoma, some cellulitis.  He had a culture of wound drainage, which  came back multiple flora and was unreliable for source of infection.  His staples were removed every other one distally.  He has  serosanguineous drainage proximally, which is still draining.  He is  doing well in terms of his temperature, which is 98, has been afebrile  throughout.  Blood pressure this morning was 105/64 with a pulse of 80,  did get one episode of tachycardia with pulse of 142, blood pressure  that time was 74/50.  His hemoglobin is 10.0.  His metabolic panel was  normal.   On yesterday, he had an x-ray of his left hip, which shows a stable  fixation with a 95-degree condylar screw.   Plan is to use wound drainage approximately on the dressing and remove  the remaining staples.  The erythema surrounding the wound has  significantly improved.  We will continue to watch his pressure, his  wound drainage.  At this point, I think he has seroma underneath the  incision, which does not need any further surgical treatment.  He is on  oral Levaquin and he is nonweightbearing.  He should be able to go to a  rehab center within a day or two.      Vickki Hearing, M.D.  Electronically Signed     SEH/MEDQ  D:  02/26/2008  T:  02/26/2008  Job:  [id number]

## 2010-05-17 NOTE — Consult Note (Signed)
NAMEARMANDO, Jermaine [name]                ACCOUNT NO.:  [id number]   MEDICAL RECORD NO.:  [id number]          PATIENT TYPE:  INP   LOCATION:  A316                          FACILITY:  [facility]   PHYSICIAN:  Dennie Maizes, M.D.   DATE OF BIRTH:  [birth year]   DATE OF CONSULTATION:  02/28/2007  DATE OF DISCHARGE:                                 CONSULTATION   REASON FOR CONSULTATION:  Elevated PSA.   HISTORY OF PRESENT ILLNESS:  This 77 year old male has been admitted to  hospital with history of fall and difficulty talking.  He also had pain  in the left lower extremity.  He was unable to ambulate.  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   The patient has a history of having elevated PSA of 15 ng/mL in the  past.  He did not keep his followup appointment.  He has a history of  alcohol abuse.   He denied having any voiding difficulty at present.  There is no history  of flank pain, hematuria, or dysuria.  Urinary frequency q.1h. and  nocturia x3-4.  There is no past history of GU of prostate problems.   PAST MEDICAL HISTORY:  1. Elevated PSA.  2. Alcohol abuse.  3. Weight loss.  4. Status post right inguinal hernia repair.   MEDICATIONS:  Lovenox, folic acid, nicotine patch, thiamine, Tylenol,  albuterol inhaler, Atrovent inhaler, morphine sulfate for pain.   ALLERGIES:  NO KNOWN DRUG ALLERGIES.   PHYSICAL EXAMINATION:  GENERAL:  The patient appears very thin.  ABDOMEN:  Soft.  No palpable flank masses.  GU:  The bladder is not palpable.  Penis and testes are normal.  RECTAL:  Large, 50 gram size firm prostate with nodules.   LABORATORY DATA:  On admission, PSA 65.1 mg/mL.  BUN 6, creatinine 0.87.  CBC:  WBC 4.8, hemoglobin 11.1, hematocrit 31.4, platelet count is 142.   IMPRESSION:  Elevated prostate-specific antigen, prostate nodules.   PLAN:  1. Will discuss with the patient regarding the significance of      elevated PSA associated with prostate nodules.  Prostate cancer needs to be ruled out.  2. The patient was advised to return to the office in 2 weeks for      scheduling transrectal ultrasound with ultrasound-guided biopsy of      the prostate.  Thanks for this consult.      Dennie Maizes, M.D.  Electronically Signed     SK/MEDQ  D:  02/28/2007  T:  03/01/2007  Job:  [id number]   cc:   Tesfaye D. [name], MD  Fax: [phone]

## 2010-05-20 NOTE — Discharge Summary (Signed)
NAMEROMIR, KLIMOWICZ                ACCOUNT NO.:  [id number]   MEDICAL RECORD NO.:  [id number]           PATIENT TYPE:  INP   LOCATION:  A332                          FACILITY:  [facility]   PHYSICIAN:  Tesfaye D. [name], MD   DATE OF BIRTH:  1931/08/02   DATE OF ADMISSION:  08/09/2008  DATE OF DISCHARGE:  08/11/2010LH                               DISCHARGE SUMMARY   DISCHARGE DIAGNOSES:  1. Syncopal episode.  2. History of alcohol abuse.  3. Protein-calorie malnutrition.  4. History of vascular necrosis of bilateral hip.  5. History of deep venous thrombosis.  6. History of fracture of the left hip and status post hip      replacement.   DISCHARGE MEDICATIONS:  1. Thiamine 100 mg p.o. daily.  2. Folic acid 100 mg daily.   DISPOSITION:  The patient was discharged home in stable condition.   HOSPITAL COURSE:  This is a 77 year old male patient with history of  alcohol abuse, who was brought to emergency room due to syncopal  episode.  The patient currently living with his nephew.  He was  previously in rest home.  The patient declined to stay in rest home.  He  continued to drink alcohol heavily.  He was brought due to syncopal  episode.  The patient was admitted and evaluated.  He remained stable  during the hospital stay.  The patient declined any placement.  There is  no specific etiology that was identified for his syncope.  The patient  was discharged back home to be followed in outpatient in stable  condition.      Tesfaye D. [name], MD  Electronically Signed     TDF/MEDQ  D:  08/28/2008  T:  08/28/2008  Job:  [id number]

## 2010-05-20 NOTE — Op Note (Signed)
Jermaine [name], Jermaine [name]                ACCOUNT NO.:  [id number]   MEDICAL RECORD NO.:  [id number]          PATIENT TYPE:  AMB   LOCATION:  DAY                           FACILITY:  [facility]   PHYSICIAN:  Jerolyn Shin C. Katrinka Blazing, M.D.   DATE OF BIRTH:  11/01/1931   DATE OF PROCEDURE:  12/01/2003  DATE OF DISCHARGE:                                 OPERATIVE REPORT   PREOPERATIVE DIAGNOSIS:  Right inguinal hernia.   POSTOPERATIVE DIAGNOSIS:  Right inguinal hernia.   PROCEDURE:  Right inguinal hernia repair.   SURGEON:  Dr. Katrinka Blazing.   DESCRIPTION:  Under spinal anesthesia, the patient's lower abdomen and  genitalia and inguinal area were prepped and draped in a sterile field.  Curvilinear and lower inguinal incision was made. Incision extended through  the subcutaneous tissue to the aponeurosis of the external oblique. The  external oblique was opened in the line of its fibers. The ilioinguinal and  ilial hypergastric nerves were isolated and were preserved. The cord was  mobilized from the floor. There was a large indirect sac which extended down  into the scrotum. This was pulled back, and the testicle was pulled back  into the operative field. Using blunt dissection, the sac was then separated  from the cord back to the internal ring. Once this was done, the sac was  invaginated, and the large defect at the internal ring was closed with 2  large mesh plugs. The plugs were sutured to each other and then sutured  circumferentially. The attenuating cremasteric muscle was then sutured over  the plug. An [place] patch was then placed in the inguinal floor. It was  sutured with multiple interrupted sutures of 0 Prolene circumferentially. It  was sutured around the cord with enough space so that the cord moved freely  without having any potential for strangulation. The cord was placed in  anatomic position over the floor. The aponeurosis was closed with running 3-  0 Monocryl. Subcutaneous tissue was closed  with running 3-0 Monocryl. Skin  was closed with subcuticular 4-0 Vicryl. The wound was infiltrated with 20  cc of 0.5% Marcaine with epinephrine 1 to 200,000 concentration. Dressing  was placed. The patient was transferred to a bed and taken to the  postanesthetic care unit in a satisfactory condition.     Lero   LCS/MEDQ  D:  12/01/2003  T:  12/01/2003  Job:  [id number]

## 2010-05-20 NOTE — H&P (Signed)
[name], [name]                ACCOUNT NO.:  [id number]   MEDICAL RECORD NO.:  [id number]          PATIENT TYPE:  AMB   LOCATION:  DAY                           FACILITY:  [facility]   PHYSICIAN:  Jerolyn Shin C. Katrinka Blazing, M.D.   DATE OF BIRTH:  Oct 11, 1931   DATE OF ADMISSION:  DATE OF DISCHARGE:  LH                                HISTORY & PHYSICAL   HISTORY:  A 77 year old male with a history of right inguinal mass for one  year.  He has had gradual enlargement.  He had severe pain in early  November.  He was seen in the emergency room and treated.  He continues to  be symptomatic and is scheduled for right inguinal hernia repair.   PAST MEDICAL HISTORY:  Unremarkable except for chronic obstructive lung  disease.   MEDICATIONS:  None.   PAST SURGICAL HISTORY:  Umbilical hernia repair.   SOCIAL HISTORY:  He is divorced, retired Curator.  Smokes one pack of  cigarettes per day.  He says he stopped drinking 10 years ago.  He does not  use drugs.   PHYSICAL EXAMINATION:  VITAL SIGNS:  Blood pressure 150/90, pulse 84,  respirations 20, weight 128 pounds.  HEENT:  Unremarkable.  NECK:  Supple without JVD or bruit.  CHEST:  Diffuse rhonchi with chronic wheezes.  HEART:  Regular rate and rhythm without murmurs, rubs, or gallops.  ABDOMEN:  Soft, nontender, no masses.  Large right inguinal hernia.  EXTREMITIES:  No cyanosis, clubbing, or edema.  NEUROLOGIC:  No focal motor, sensory, or cerebellar abnormalities.   IMPRESSION:  1.  Right inguinal hernia.  2.  Chronic obstructive lung disease.  3.  Chronic bronchitis.   PLAN:  Right inguinal hernia repair.     Lero   LCS/MEDQ  D:  11/30/2003  T:  12/01/2003  Job:  [id number]

## 2010-05-20 NOTE — Discharge Summary (Signed)
Jermaine [name], Jermaine [name]                ACCOUNT NO.:  [id number]   MEDICAL RECORD NO.:  [id number]          PATIENT TYPE:  INP   LOCATION:  A313                          FACILITY:  [facility]   PHYSICIAN:  Tesfaye D. [name], MD   DATE OF BIRTH:  September 24, 1931   DATE OF ADMISSION:  04/30/2007  DATE OF DISCHARGE:  05/04/2009LH                               DISCHARGE SUMMARY   DISCHARGE DIAGNOSES:  1. Deep venous thrombosis of the left lower extremity.  2. Anemia of chronic disease.  3. History of alcohol abuse.  4. Bilateral hip avascular necrosis.  5. History of elevated PSA.   DISPOSITION:  The patient was discharged to rest home in stable  condition.   HOSPITAL COURSE:  This is a 77 year old male patient with history of  multiple medical illnesses, who is in a local nursing home, brought to  my office due to pain and swelling of left lower extremity.  The patient  underwent venous Doppler of lower extremity which showed acute deep  venous thrombosis of both femoral veins.  The patient was admitted and  was started on anticoagulation.  Initially he received heparin and  Coumadin.  After his INR became therapeutic, the patient was continued  on Coumadin alone.  He was discharged back to rest home to continue his  Coumadin and to monitor his PT/INR in the office.      Tesfaye D. [name], MD  Electronically Signed     TDF/MEDQ  D:  06/10/2007  T:  06/10/2007  Job:  [id number]

## 2010-06-07 ENCOUNTER — Telehealth: Payer: Self-pay | Admitting: Orthopedic Surgery

## 2010-06-07 NOTE — Telephone Encounter (Signed)
Faxed medical records as per request to [facility]. Fax # [phone], ph [phone]. Signed authorization received and on file.

## 2010-06-22 NOTE — Group Therapy Note (Signed)
NAMECORREY, WEIDNER                ACCOUNT NO.:  [id number]  MEDICAL RECORD NO.:  [id number]           PATIENT TYPE:  I  LOCATION:  A329                          FACILITY:  [facility]  PHYSICIAN:  Ladona Horns. Demontez Novack, MD  DATE OF BIRTH:  27-Sep-1931  DATE OF PROCEDURE:  05/10/2010 DATE OF DISCHARGE:  05/10/2010                                PROGRESS NOTE   SUBJECTIVE:  The patient denies any pain presently.  He asks me where "Ed" is because he owes the patient money.  The patient is clearly confused this morning.  He did not know he was in the hospital.  PHYSICAL EXAMINATION:  VITAL SIGNS:  Vitals obtained at 0600 hours reveals a temperature 98.6, pulse 79, respirations 16, blood pressure January 06, 1965, oxygen saturation 96% on 2 L. GENERAL:  The patient awake in bed.  He does not appear to be in acute distress.  He is pleasant.  He is confused.  He is cachectic. HEENT:  Anicteric sclerae.  Poor dentition. NECK:  No carotid bruits appreciated bilaterally.  There is a right anterior chain cervical lymph node that is noted.  It is not hard or fixed.  It is soft.  It is easily mobile.  It is approximately 0.5 cm in size. CARDIAC:  Regular rate and rhythm without murmur, rub, or gallop.  No S3 or S4 appreciated. LUNGS:  Clear to auscultation bilaterally without wheezes, rales, or rhonchi.  No accessory muscle use appreciated. ABDOMEN:  Positive bowel sounds in all four quadrants.  Soft. Nontender. EXTREMITIES:  No upper or lower extremity edema appreciated bilaterally. No tenderness to palpation of the calves or popliteal fossa bilaterally. Pedal pulses are intact bilaterally. LYMPHATICS:  No infraclavicular or supraclavicular lymphadenopathy noted.  No axillary nodes appreciated.  No inguinal nodes noted. SKIN:  Skin is warm and dry. NEURO:  No focal deficits appreciated.  The patient is confused this morning.  LABORATORY DATA:  White blood cell count 5.5, hemoglobin 9.2,  hematocrit 26.7, platelet count 170, MCV 101.9.  Sodium 134, potassium 2.8, chloride 100, bicarbonate 26, BUN 4, creatinine 0.51, glucose 77.  LDH 138.  PT 26.1, INR 2.38.  ASSESSMENT: 1. Hypokalemia. 2. Increased PSA, 150.21.  Questionable prostate cancer. 3. Pneumonia, posterior right lower lobe, on antibiotic therapy. 4. Acute pulmonary embolism. 5. Destructive bony lesion of left ilium, question metastatic disease. 6. Osteoporotic compression fracture of T10 vertebral body. 7. Failure to thrive. 8. Chronic alcoholism. 9. Macrocytic anemia, likely secondary to alcohol abuse. 10.Hypokalemia.  PLAN:  The plan is as follows: 1. I will discuss the patient with Dr. Gaylyn Rong (Hematology/Oncology) this     morning.  Dr. Gaylyn Rong is covering for Dr. Mariel Sleet today due to Dr.     Thornton Papas absence from the clinic. 2. Potassium chloride 60 mEq by mouth in liquid form. 3. BMET 3 hours following potassium chloride administration. 4. Urinalysis.  If urinalysis is positive for nitrites or leukocytes,     I recommend a culture and sensitivity as well. 5. More recommendations may follow this morning after discussion with     Dr. Gaylyn Rong. 6. Upon discharge,  please call the [facility] for     followup appointments.  All questions were answered.  The patient knows to call the clinic with any problems, questions, or concerns.  The patient's plan discussed with Dr. Mariel Sleet and Dr. Gaylyn Rong and they are in agreement with the aforementioned.    ______________________________ Maurine Minister Kefalas III, PA-C   ______________________________ Ladona Horns. Mariel Sleet, MD    TSK/MEDQ  D:  05/10/2010  T:  05/10/2010  Job:  [id number]  Electronically Signed by Dellis Anes III PA on 05/11/2010 08:57:05 AM Electronically Signed by Glenford Peers MD on 06/22/2010 02:20:50 PM

## 2010-06-22 NOTE — Consult Note (Signed)
Jermaine [name], Jermaine [name]                ACCOUNT NO.:  [id number]  MEDICAL RECORD NO.:  [id number]           PATIENT TYPE:  I  LOCATION:  A329                          FACILITY:  [facility]  PHYSICIAN:  Ladona Horns. Mariel Sleet, MD  DATE OF BIRTH:  May 15, 1931  DATE OF CONSULTATION:  05/09/2010 DATE OF DISCHARGE:                                CONSULTATION   REFERRING PHYSICIAN:  Wilson Singer, MD  REASON FOR REFERRAL:  Increased prostate-specific antigen.  HISTORY OF PRESENT ILLNESS:  This is a 77 year old African American gentleman who was seen in the emergency department on May 05, 2010, after falling.  He told the emergency room physician that he fell the day prior and due to pain in his left hip, was unable to take care of himself at home.  He eventually called EMS and was brought to the hospital due to weakness and inability to ambulate effectively.  This a patient who is known to be a chronic alcoholic which may have played a part in his fall.  Upon admission, an x-ray of the chest first was performed, which showed a right infrahilar mass-like opacity which is highly suspicious for a primary bronchogenic carcinoma.  Further evaluation with chest CT should be considered.  Osteopenia with definite acute or post chronic deformity identified.  Underlying hyperinflation.  As a result of his fall and left hip pain, a left hip x-ray was also performed, which showed deformity of proximal left femur post intertrochanteric fracture and ORIF.  Questionable loosening of hardware, proximal left femur. Flattening of cranial margin of left femoral head suggesting avascular necrosis.  Degenerative changes in the left hip joint.  Further radiographic studies were performed including a CT of the chest which was suggestive of pneumonia of the posterior right lower lobe with associated necrosis.  No discrete mass was identified during this examination.  An acute pulmonary embolus was identified involving  a segmental branch of the right lower lobe pulmonary artery.  Severe COPD was noted.  There was a stable 4.7 cm ascending thoracic aortic aneurysm identified as well.  On this radiographic study, an osteoporotic compression fracture involving the T10 vertebral body was demonstrated.  A CT of abdomen with contrast was performed on May 06, 2010, which was worrisome for a questionable destructive bony lesion in the left ileum, lytic metastasis and melanoma are not excluded.  As a result, a bone scan was performed on May 09, 2010, which did not identify a definite abnormal site of tracer localization that will suggest osseous metastatic disease.  The site of abnormality identified on the preceding CT exam could still be metastasis or myeloma.  This evening, the patient was not very talkative.  He seemed slightly agitated, which could be related to alcohol withdrawal.  I have asked the nurses to keep an eye on this and if this gets worse, to contact the hospitalist who is taking care of this patient.  The patient complains of some low back pain.  Other than that, ROS questioning was negative. The patient denies any headache, dizziness, double vision, fevers, chills, night sweats, nausea, vomiting, constipation, abdominal pain,  chest pain, heart palpitations, shortness of breath, blood in stool, black tarry stool, urinary pain, urinary burning, urinary frequency, hematuria.  The patient does admit to some diarrhea.  I was unable to ascertain his definition of diarrhea.  PAST MEDICAL HISTORY: 1. Bilateral hip avascular necrosis. 2. Chronic alcoholism. 3. History of DVT in the left lower extremity.  ALLERGIES:  No known drug allergies.  MEDICATIONS AT HOME:  The patient takes no medications at home.  MEDICATIONS IN THE HOSPITAL: 1. Antiseptic rinse 15 mL every 8 hours as needed. 2. Ciprofloxacin 1 drop every 4 hours. 3. Lovenox 50 mg every 12 hours. 4. Ensure 237 mL 3 times daily. 5.  Folic acid 1 mg daily. 6. Guaifenesin 60 mg b.i.d. 7. Lorazepam 0.5 mg b.i.d. 8. Ativan 1-4 mg every 12 hours as needed. 9. Avelox 400 mg daily. 10.Multivitamin with minerals 1 capsule daily. 11.Zosyn 3.375 grams every 8 hours. 12.Potassium chloride 40 mEq. 13.Thiamine 100 mg daily. 14.Coumadin 5 mg daily. 15.Warfarin pharmacy protocol. 16.Hydromorphone injection 0.5-1 mg every 4 hours p.r.n.  PAST SURGICAL HISTORY: 1. Open reduction of left hip, subtrochanteric fracture by Dr.     Romeo Apple on February 14, 2008. 2. Right inguinal hernia repair on December 01, 2003 by Dr. Elpidio Anis  FAMILY HISTORY:  The patient is unable to tell me about his family history.  However, it was able to gather that he has a family history of coronary artery disease.  SOCIAL HISTORY:  The patient tells me he was born here in [place]. He presently resides in [place].  He has finished high school. 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  In regards to his marriage status, he says he has not seen his wife in over 10 years.  The patient lives by himself at home.  The patient explains to me he does drink alcohol.  I was unable to ascertain from the patient how much he drinks alcohol, but according to e-chart, he consumes approximately one-fifth of spirits per weekend.  The patient does admit to a half-a-pack per day smoking history for 50+ years.  The patient denies any illicit drug use, however, it is noted in e-chart that he does abuse cannabis.  SCREENING TESTS:  I did not colonoscopy report in e-chart. The patient is unable tell me whether he has undergone a colonoscopy.  PERFORMANCE STATUS:  The patient's ECOG performance status is most likely of 3.  PSYCHOSOCIAL HISTORY:  The patient has a poor family support system.  He has 1 son who tries to look after him, however, there is quite a distance between his son's house and where the patient presently resides.   The son works long hours which makes it difficult for him to watch after his father.  PHYSICAL EXAMINATION:  VITAL SIGNS:  Vitals obtained at 1400 hours reveal a temperature of 98.2, pulse 91, respirations 16, blood pressure 95/64, oxygen saturation 96% on 2 liters via nasal cannula. GENERAL:  The patient is seen lying in bed.  He is awake.  He is alert and oriented x3.  He is not appear to be in any acute distress.  His son and his son's girlfriend are present at the bedside. HEENT:  Atraumatic, normocephalic.  Anicteric sclerae. NECK:  Trachea midline. CARDIAC:  Regular rate and rhythm without murmur, rub, or gallop.  No S3, S4 appreciated. LUNGS:  Clear to auscultation bilaterally, assessed anteriorly.  No accessory muscle use appreciated. ABDOMEN:  Positive  bowel sounds in all 4 quadrants.  Soft.  Nontender. EXTREMITIES:  No upper or lower extremity edema appreciated bilaterally. No tenderness to palpation of the calves or popliteal fossas bilaterally.  Pedal pulses are intact bilaterally. LYMPHATICS:  Deferred presently.  This was due to poor patient positioning.  We will assess lymph node status tomorrow morning. SKIN:  Capillary refill less than 1 second in fingers.  Skin is warm and dry.  Turgor is slow, but intact.  Radial pulse regular and strong and 2+. NEUROLOGIC:  No focal deficits appreciated.  The patient is alert and oriented x3.  The patient is able to tell me some current events that are happening in the news.  He is able to tell me where he is today.  He recognizes family members and remembers my name.  LABORATORY DATA:  Laboratory data obtained today May 09, 2010 reveals a PT of 18.4, an INR of 1.51.  Laboratory data obtained on May 08, 2010, reveals a white blood cell count 6.8, hemoglobin 8.7, hematocrit 25.2, platelet count 173.  Sodium 136, potassium 3.7, chloride 102, bicarbonate 30, BUN 7, creatinine 0.51, glucose 80, calcium 8.1.  PSA 150.21.  An anemia  panel performed on May 07, 2010, reveals an iron of 19, TIBC of 83, percent saturation 23, vitamin B12 514, serum folate 5.2.  Ferritin 308.  ESR 45.  RADIOGRAPHY:  A bone scan performed on May 09, 2010, reveals no definite abnormal sites of tracer localization identified to suggest osseous metastatic disease.  The site of abnormality identified on the preceding CT exam could still be related to a purely lytic metastasis or myeloma.  CT of head with contrast performed on May 08, 2010, reveals atrophy and small-vessel disease.  No intracranial mass, lesion, or abnormal enhancement.  Small bilateral subdural hygromas are noncompressive.  No destructive calvarial lesions identified.  CT of abdomen with contrast performed on May 06, 2010, reveals left lower lobe consolidation.  Question COPD with small bilateral pleural effusions.  Extensive atherosclerotic calcification with focal thrombus within abdominal aorta.  Probable small bilateral adrenal adenomas. Osseous demineralization with compression fractures of L2, L3 and L4 age indeterminate.  Questionable destructive bony lesions in the left ileum, lytic metastasis and myeloma not excluded.  CT of chest with contrast performed on May 05, 2010 reveals pneumonia involving the posterior right lower lobe with associated necrosis (lung abscess) accounts for the abnormality on the chest x-ray earlier.  No discrete mass was identified.  Followup chest x-ray after treatment is suggested to ensure complete resolution of opacity, however.  Acute pulmonary embolus involving a segmental branch of the right lower lobe pulmonary artery.  Scar and bronchiectasis deep in the left lower lobe.  Severe COPD/emphysema. Stable approximate 4.7 cm descending thoracic aortic aneurysm.  Age indeterminate osteoporotic compression fracture involving the T10 vertebral body.  Bilateral adrenal hyperplasia.  X-ray of chest performed on May 05, 2010 reveals right  infrahilar mass- like opacity is highly suspicious for a primary bronchogenic carcinoma. Further evaluation with chest CT should be considered.  Osteopenia without definite acute or post traumatic deformity identified. Underlying hyperinflation.  Left hip x-ray performed on May 05, 2010, reveals deformity of proximal left femur post intertrochanteric fracture and ORIF.  Question loosening of hardware at proximal left femur.  Flattening of cranial margin of left femoral head suggesting avascular necrosis.  Degenerative changes in the left hip joint.  ASSESSMENT: 1. Increased prostate-specific antigen, 150.21.  Question prostate     cancer with potential  metastasis to left ileum. 2. Pneumonia, in the posterior right lower lobe.  On antibiotic     therapy. 3. Acute pulmonary embolism on anticoagulation with Lovenox bridge     while titrating Coumadin level. 4. Destructive bony lesion of left ileum. 5. Osteoporotic compression fracture of T10 vertebral body. 6. Failure to thrive. 7. Chronic alcoholism. 8. Macrocytic anemia likely secondary to alcoholism.  PLAN:  The plan is as follows: 1. We will obtain an SPEP with quantitative IFE. 2. We will obtain an LDH level. 3. We will obtain a beta-2 microglobulin level. 4. I think it is reasonable to obtain the above-mentioned test to rule     out multiple myelomas.  This is important because the bone scan was     negative for the left ileum lesion, which would be missed on a     nuclear bone scan.  A skeletal survey would be a better     radiographic option to visualize this.  We will wait to see what     the above laboratory data shows prior to ordering any skeletal     surveys. 5. I do not believe the patient would be a great candidate for     aggressive chemotherapy, but anti-hormonal therapy or possibly     radiation may be an option if this is prostate cancer. 6. This patient is certainly not safe for discharge to home.  Skilled      nursing facility may be a better option. 7. We will continue to follow the patient. 8. I will discuss the patient's case with Dr. Gaylyn Rong tomorrow morning.     Dr. Gaylyn Rong is covering for Dr. Mariel Sleet in his absence tomorrow. 9. Further recommendations may be following in the near future.  All questions were answered.  The patient knows to call the clinic with any problems, questions or concerns.  The patient's plan discussed with Dr. Mariel Sleet and he is in agreement with the aforementioned.    ______________________________ Maurine Minister Kefalas III, PA-C   ______________________________ Ladona Horns. Mariel Sleet, MD    TSK/MEDQ  D:  05/09/2010  T:  05/10/2010  Job:  [id number]  Electronically Signed by Dellis Anes III PA on 05/11/2010 08:56:20 AM Electronically Signed by Glenford Peers MD on 06/22/2010 02:19:04 PM

## 2010-09-23 LAB — BLOOD GAS, ARTERIAL
Acid-Base Excess: 5.2 — ABNORMAL HIGH
Bicarbonate: 28.3 — ABNORMAL HIGH
TCO2: 25.6
pCO2 arterial: 34.6 — ABNORMAL LOW
pO2, Arterial: 45.5 — ABNORMAL LOW

## 2010-09-23 LAB — CBC
HCT: 31.4 — ABNORMAL LOW
Hemoglobin: 11.1 — ABNORMAL LOW
MCV: 105.6 — ABNORMAL HIGH
WBC: 4.8

## 2010-09-23 LAB — BASIC METABOLIC PANEL
CO2: 31
Calcium: 7.9 — ABNORMAL LOW
Chloride: 87 — ABNORMAL LOW
GFR calc Af Amer: >60
GFR calc non Af Amer: >60
Potassium: 2.9 — ABNORMAL LOW
Potassium: 3.5
Sodium: 132 — ABNORMAL LOW
Sodium: 132 — ABNORMAL LOW

## 2010-09-23 LAB — DIFFERENTIAL
Eosinophils Absolute: 0.1
Eosinophils Relative: 1
Lymphocytes Relative: 38
Lymphs Abs: 1.8
Monocytes Absolute: 0.5
Monocytes Relative: 10

## 2010-09-23 LAB — ETHANOL: Alcohol, Ethyl (B): 104 — ABNORMAL HIGH

## 2010-09-26 ENCOUNTER — Other Ambulatory Visit: Payer: Self-pay

## 2010-09-26 ENCOUNTER — Emergency Department (HOSPITAL_COMMUNITY)
Admission: EM | Admit: 2010-09-26 | Discharge: 2010-09-27 | Disposition: A | Discharge status: Home / Self Care | Payer: Medicare Other | Attending: Emergency Medicine | Admitting: Emergency Medicine

## 2010-09-26 ENCOUNTER — Emergency Department (HOSPITAL_COMMUNITY): Payer: Medicare Other

## 2010-09-26 DIAGNOSIS — D649 Anemia, unspecified: Secondary | ICD-10-CM | POA: Insufficient documentation

## 2010-09-26 DIAGNOSIS — D72829 Elevated white blood cell count, unspecified: Secondary | ICD-10-CM | POA: Insufficient documentation

## 2010-09-26 DIAGNOSIS — R7309 Other abnormal glucose: Secondary | ICD-10-CM | POA: Insufficient documentation

## 2010-09-26 DIAGNOSIS — N390 Urinary tract infection, site not specified: Secondary | ICD-10-CM

## 2010-09-26 DIAGNOSIS — E871 Hypo-osmolality and hyponatremia: Secondary | ICD-10-CM | POA: Insufficient documentation

## 2010-09-26 DIAGNOSIS — R0789 Other chest pain: Secondary | ICD-10-CM

## 2010-09-26 LAB — BLOOD GAS, ARTERIAL
TCO2: 22.6
pCO2 arterial: 34 — ABNORMAL LOW
pH, Arterial: 7.488 — ABNORMAL HIGH
pO2, Arterial: 88.3

## 2010-09-26 LAB — URINALYSIS, ROUTINE W REFLEX MICROSCOPIC
Glucose, UA: NEGATIVE mg/dL
Ketones, ur: 15 mg/dL — AB
Leukocytes, UA: NEGATIVE
Nitrite: POSITIVE — AB
Protein, ur: 30 mg/dL — AB
Urobilinogen, UA: 0.2 mg/dL (ref 0.0–1.0)

## 2010-09-26 LAB — BASIC METABOLIC PANEL
BUN: 14 mg/dL (ref 6–23)
CO2: 26 mEq/L (ref 19–32)
Calcium: 8.2 — ABNORMAL LOW
Chloride: 97
Chloride: 96 mEq/L (ref 96–112)
Creatinine, Ser: 0.64
Creatinine, Ser: 0.62 mg/dL (ref 0.50–1.35)
GFR calc Af Amer: >60

## 2010-09-26 LAB — CBC
HCT: 33.1 % — ABNORMAL LOW (ref 39.0–52.0)
Hemoglobin: 11.6 g/dL — ABNORMAL LOW (ref 13.0–17.0)
RBC: 2.95 — ABNORMAL LOW
WBC: 5.9
WBC: 15.9 10*3/uL — ABNORMAL HIGH (ref 4.0–10.5)

## 2010-09-26 LAB — DIFFERENTIAL
Basophils Absolute: 0 10*3/uL (ref 0.0–0.1)
Basophils Relative: 0
Eosinophils Relative: 0
Lymphocytes Relative: 63 — ABNORMAL HIGH
Lymphocytes Relative: 7 % — ABNORMAL LOW (ref 12–46)
Monocytes Absolute: 0.3 10*3/uL (ref 0.1–1.0)
Monocytes Relative: 2 % — ABNORMAL LOW (ref 3–12)
Neutro Abs: 14.6 10*3/uL — ABNORMAL HIGH (ref 1.7–7.7)
Neutrophils Relative %: 31 — ABNORMAL LOW

## 2010-09-26 LAB — HEPATIC FUNCTION PANEL
Bilirubin, Direct: 0.3 mg/dL (ref 0.0–0.3)
Indirect Bilirubin: 0.8 mg/dL (ref 0.3–0.9)
Total Bilirubin: 1.1 mg/dL (ref 0.3–1.2)

## 2010-09-26 LAB — LIPASE, BLOOD: Lipase: 8 U/L — ABNORMAL LOW (ref 11–59)

## 2010-09-26 LAB — POCT I-STAT TROPONIN I: Troponin i, poc: 0 ng/mL (ref 0.00–0.08)

## 2010-09-26 NOTE — ED Notes (Signed)
edp in to eval 

## 2010-09-26 NOTE — ED Notes (Signed)
Offered dinner tray to pt. Declined. States he does not like the smell of food. Cont to wait to be reeval and disposition

## 2010-09-26 NOTE — ED Notes (Signed)
Pt states he has had chest pain and nausea since yesterday

## 2010-09-26 NOTE — ED Notes (Signed)
Pt  Removed heart monitor, says he wants to go home. Dr Lynelle Doctor aware pt wants to go home.

## 2010-09-26 NOTE — ED Provider Notes (Signed)
Scribed for Ward Givens, MD, the patient was seen in room [facility]18/[facility]. This chart was scribed by [employer]. The patient's care started at 15:44  CSN: [id number] Arrival date & time: 09/26/2010  3:12 PM  Chief Complaint  Patient presents with  . Chest Pain  Pt is a poor historian.   HPI  HPI Jermaine [name] is a 77 y.o. male who presents to the Emergency Department complaining of intermittent mid chest pain, onset 3a.m 09/26/2010, with episodes lasting 2 minutes. Pt is unable to characterize the pain states it is just "a pain". Denies it being sharp. Associated symptoms include productive cough with yellow sputum, vomiting and nausea. Patient denies any associated shortness of breath, fever, abdominal pain or any history of heart problems. Reports smoking tobacco but denies alcohol use. Patient lives at home alone.  HPI ELEMENTS:  Location: Chest  Onset: 3.am 09/26/2010 Duration: 15 hours  Timing: intermittent  Context:  as above  Associated symptoms: productive cough, vomiting and nausea    PMH Hip pain Spinal stenosis  Surgery Hip replacement last year    History  Substance Use Topics  . Smoking status: Current Everyday Smoker    Types: Cigarettes  . Smokeless tobacco: Not on file  . Alcohol Use: No  PT lives home alone    Review of Systems  Review of Systems  Allergies  Review of patient's allergies indicates no known allergies.  Home Medications   Current Outpatient Rx  Name Route Sig Dispense Refill  . MELOXICAM 15 MG PO TABS Oral Take 15 mg by mouth 2 (two) times daily.      . TRAMADOL HCL 50 MG PO TABS Oral Take 50 mg by mouth every 8 (eight) hours as needed. For pain     Pt doesn't know his medications  Physical Exam    BP 110/90  Pulse 100  Temp(Src) 98.3 F (36.8 C) (Oral)  Resp 24  Ht 5\' 10"  (1.778 m)  SpO2 95%  Physical Exam  Constitutional: He is oriented to person, place, and time. He appears well-developed and well-nourished.  HENT:    Head: Normocephalic and atraumatic.  Mouth/Throat: Oropharynx is clear and moist.       Tongue is dry and coated. Poor dentition, pt missing many upper teeth making his speech hard to understand  Eyes: EOM are normal. Pupils are equal, round, and reactive to light.  Neck: Neck supple. No tracheal deviation present.  Cardiovascular: Normal rate, regular rhythm and normal heart sounds.   Pulmonary/Chest: Effort normal and breath sounds normal. No respiratory distress. He has no wheezes. He exhibits no tenderness.  Abdominal: Soft. Bowel sounds are normal. There is no tenderness. There is no rebound.  Musculoskeletal: Normal range of motion. He exhibits no edema.  Neurological: He is alert and oriented to person, place, and time.  Skin: Skin is warm and dry. No rash noted. He is not diaphoretic. No erythema.  Psychiatric: He has a normal mood and affect.    ED Course  Procedures  OTHER DATA REVIEWED: Nursing notes, vital signs, and past medical records reviewed.  DIAGNOSTIC STUDIES: Oxygen Saturation is 95% on room air, normal by my interpretation.     Date: 09/26/2010  Rate: 107  Rhythm: sinus tachycardia and premature atrial contractions (PAC)  QRS Axis: normal  Intervals: normal  ST/T Wave abnormalities: nonspecific ST/T changes  Conduction Disutrbances:nonspecific intraventricular conduction delay  Narrative Interpretation: LVH, q waves septal leads  Old EKG Reviewed: unchanged from 05/10/2010  LABS / RADIOLOGY Results for orders placed during the hospital encounter of 09/26/10  CBC      Component Value Range   WBC 15.9 (*) 4.0 - 10.5 (K/uL)   RBC 3.78 (*) 4.22 - 5.81 (MIL/uL)   Hemoglobin 11.6 (*) 13.0 - 17.0 (g/dL)   HCT 16.1 (*) 09.6 - 52.0 (%)   MCV 87.6  78.0 - 100.0 (fL)   MCH 30.7  26.0 - 34.0 (pg)   MCHC 35.0  30.0 - 36.0 (g/dL)   RDW 04.5 (*) 40.9 - 15.5 (%)   Platelets 189  150 - 400 (K/uL)  DIFFERENTIAL      Component Value Range   Neutrophils  Relative 92 (*) 43 - 77 (%)   Neutro Abs 14.6 (*) 1.7 - 7.7 (K/uL)   Lymphocytes Relative 7 (*) 12 - 46 (%)   Lymphs Abs 1.0  0.7 - 4.0 (K/uL)   Monocytes Relative 2 (*) 3 - 12 (%)   Monocytes Absolute 0.3  0.1 - 1.0 (K/uL)   Eosinophils Relative 0  0 - 5 (%)   Eosinophils Absolute 0.0  0.0 - 0.7 (K/uL)   Basophils Relative 0  0 - 1 (%)   Basophils Absolute 0.0  0.0 - 0.1 (K/uL)  BASIC METABOLIC PANEL      Component Value Range   Sodium 132 (*) 135 - 145 (mEq/L)   Potassium 4.1  3.5 - 5.1 (mEq/L)   Chloride 96  96 - 112 (mEq/L)   CO2 26  19 - 32 (mEq/L)   Glucose, Bld 136 (*) 70 - 99 (mg/dL)   BUN 14  6 - 23 (mg/dL)   Creatinine, Ser 8.11  0.50 - 1.35 (mg/dL)   Calcium 8.9  8.4 - 91.4 (mg/dL)   GFR calc non Af Amer >60  >60 (mL/min)   GFR calc Af Amer >60  >60 (mL/min)  POCT I-STAT TROPONIN I      Component Value Range   Troponin i, poc 0.00  0.00 - 0.08 (ng/mL)   Comment 3           HEPATIC FUNCTION PANEL      Component Value Range   Total Protein 6.8  6.0 - 8.3 (g/dL)   Albumin 2.9 (*) 3.5 - 5.2 (g/dL)   AST 19  0 - 37 (U/L)   ALT 7  0 - 53 (U/L)   Alkaline Phosphatase 85  39 - 117 (U/L)   Total Bilirubin 1.1  0.3 - 1.2 (mg/dL)   Bilirubin, Direct 0.3  0.0 - 0.3 (mg/dL)   Indirect Bilirubin 0.8  0.3 - 0.9 (mg/dL)  LIPASE, BLOOD      Component Value Range   Lipase 8 (*) 11 - 59 (U/L)  URINALYSIS, ROUTINE W REFLEX MICROSCOPIC      Component Value Range   Color, Urine ORANGE (*) YELLOW    Appearance CLEAR  CLEAR    Specific Gravity, Urine >1.030 (*) 1.005 - 1.030    pH 6.0  5.0 - 8.0    Glucose, UA NEGATIVE  NEGATIVE (mg/dL)   Hgb urine dipstick NEGATIVE  NEGATIVE    Bilirubin Urine MODERATE (*) NEGATIVE    Ketones, ur 15 (*) NEGATIVE (mg/dL)   Protein, ur 30 (*) NEGATIVE (mg/dL)   Urobilinogen, UA 0.2  0.0 - 1.0 (mg/dL)   Nitrite POSITIVE (*) NEGATIVE    Leukocytes, UA NEGATIVE  NEGATIVE   URINE MICROSCOPIC-ADD ON      Component Value Range   Squamous Epithelial  /  LPF FEW (*) RARE    WBC, UA 7-10  <3 (WBC/hpf)   Bacteria, UA FEW (*) RARE    Laboratory impression leukocytosis, mild anemia, mild hyperglycemia and hyponatremia, uti  Dg Chest Portable 1 View  09/26/2010  *RADIOLOGY REPORT*  Clinical Data: Chest pain.  PORTABLE CHEST - 1 VIEW  Comparison: CT chest and chest radiograph 05/05/2010.  Findings: Trachea is midline.  Heart size stable.  Mild prominence of the ascending aorta.  There is bibasilar air space disease. Right basilar air laboratory impression space disease may be slightly different in location and configuration when compared with 05/05/2010.  No definite pleural fluid.  IMPRESSION: Bibasilar air space disease.  That seen at the right lung base may be slightly different in location and configuration when compared 05/05/2010.  Difficult to definitively confirm resolution (and therefore a mass) of previously seen rounded air space consolidation in the right lung base.  Non emergent CT chest should be helpful in further evaluation, as clinically indicated.  Original Report Authenticated By: Reyes Ivan, M.D.   ED COURSE / COORDINATION OF CARE: 15:45 - EDMD examined patient and ordered the following Orders Placed This Encounter  Procedures  . DG Chest Portable 1 View  . CBC  . Differential  . Basic metabolic panel  . Hepatic function panel  . Lipase, blood  . Urinalysis with microscopic  . Urine microscopic-add on  . POCT i-Stat troponin I  . ED EKG  . Saline lock IV    MDM:    IMPRESSION:Pt has atypical chest pain.   MEDICATIONS GIVEN IN THE E.D.  Medications  cephALEXin (KEFLEX) capsule 500 mg (500 mg Oral Given 09/27/10 0011)  Given for patient's UTI.   DISCHARGE MEDICATIONS: New Prescriptions   No medications on file      I personally performed the services described in this documentation, which was scribed in my presence. The recorded information has been reviewed and considered. Devoria Albe, MD, FACEP   Ward Givens, MD 09/27/10 [phone]

## 2010-09-26 NOTE — ED Notes (Signed)
Pt states his pain comes and goes. States he has not been to the doctor in a long time but, since he was having pain he thought he would come and get checked out

## 2010-09-26 NOTE — ED Notes (Signed)
Provided patient with warm blanket

## 2010-09-27 LAB — DIFFERENTIAL
Basophils Relative: 1
Eosinophils Absolute: 0.1
Eosinophils Absolute: 0.1
Eosinophils Relative: 2
Lymphs Abs: 4.4 — ABNORMAL HIGH
Monocytes Absolute: 0.5
Monocytes Relative: 7
Monocytes Relative: 8
Neutrophils Relative %: 47

## 2010-09-27 LAB — BASIC METABOLIC PANEL
CO2: 28
Chloride: 103
Creatinine, Ser: 0.7
GFR calc Af Amer: >60
Glucose, Bld: 112 — ABNORMAL HIGH

## 2010-09-27 LAB — CBC
HCT: 26.4 — ABNORMAL LOW
MCHC: 34.3
MCV: 97.8
MCV: 98.4
RBC: 2.7 — ABNORMAL LOW
RBC: 2.94 — ABNORMAL LOW
RDW: 14.4
WBC: 7.5

## 2010-09-27 LAB — PROTIME-INR: INR: 1

## 2010-09-27 LAB — RETICULOCYTES: Retic Count, Absolute: 54.8

## 2010-09-27 LAB — IRON AND TIBC
Iron: 31 — ABNORMAL LOW
TIBC: 178 — ABNORMAL LOW
UIBC: 147

## 2010-09-27 LAB — FERRITIN: Ferritin: 73 (ref 22–322)

## 2010-09-27 MED ORDER — CEPHALEXIN 500 MG PO CAPS
500.0000 mg | ORAL_CAPSULE | Freq: Once | ORAL | Status: AC
  Administered 2010-09-27: 500 mg via ORAL
  Filled 2010-09-27: qty 1

## 2010-09-27 MED ORDER — CEPHALEXIN 500 MG PO CAPS: 500.0000 mg | ORAL_CAPSULE | Freq: Three times a day (TID) | ORAL | Status: AC

## 2010-09-27 MED ORDER — TRAMADOL-ACETAMINOPHEN 37.5-325 MG PO TABS: ORAL_TABLET | ORAL | Status: DC

## 2010-09-27 NOTE — ED Notes (Signed)
Pts son called to pick him up.  Pt had removed his IV, says it was hurting him.  Site looks good.

## 2010-09-28 LAB — DIFFERENTIAL
Basophils Absolute: 0
Basophils Relative: 1
Eosinophils Absolute: 0.1
Eosinophils Absolute: 0.2
Eosinophils Relative: 2
Eosinophils Relative: 3
Lymphocytes Relative: 62 — ABNORMAL HIGH
Lymphs Abs: 3.9
Monocytes Absolute: 0.5
Monocytes Relative: 8
Neutro Abs: 1.7

## 2010-09-28 LAB — CBC
HCT: 27 — ABNORMAL LOW
Hemoglobin: 9.2 — ABNORMAL LOW
Hemoglobin: 9.2 — ABNORMAL LOW
MCHC: 34.8
MCV: 97.4
MCV: 98
RBC: 2.76 — ABNORMAL LOW
RDW: 14.9
WBC: 6.3

## 2010-09-28 LAB — PROTIME-INR: Prothrombin Time: 17.6 — ABNORMAL HIGH

## 2010-10-20 LAB — BASIC METABOLIC PANEL
CO2: 31
GFR calc Af Amer: >60
GFR calc non Af Amer: >60
Glucose, Bld: 151 — ABNORMAL HIGH
Potassium: 3 — ABNORMAL LOW
Sodium: 137

## 2010-10-20 LAB — CBC
HCT: 30.5 — ABNORMAL LOW
Hemoglobin: 10.7 — ABNORMAL LOW
MCHC: 35.1
RBC: 2.98 — ABNORMAL LOW
RDW: 15.9 — ABNORMAL HIGH

## 2010-10-20 LAB — DIFFERENTIAL
Basophils Relative: 1
Eosinophils Relative: 1
Monocytes Absolute: 0.4
Monocytes Relative: 7
Neutrophils Relative %: 23 — ABNORMAL LOW

## 2010-10-20 LAB — B-NATRIURETIC PEPTIDE (CONVERTED LAB): Pro B Natriuretic peptide (BNP): 119 — ABNORMAL HIGH

## 2011-04-07 ENCOUNTER — Emergency Department (HOSPITAL_COMMUNITY)
Admission: EM | Admit: 2011-04-07 | Discharge: 2011-04-08 | Disposition: A | Discharge status: Home / Self Care | Payer: Medicare Other | Attending: Emergency Medicine | Admitting: Emergency Medicine

## 2011-04-07 ENCOUNTER — Encounter (HOSPITAL_COMMUNITY): Payer: Self-pay | Admitting: *Deleted

## 2011-04-07 ENCOUNTER — Emergency Department (HOSPITAL_COMMUNITY): Payer: Medicare Other

## 2011-04-07 DIAGNOSIS — M949 Disorder of cartilage, unspecified: Secondary | ICD-10-CM | POA: Insufficient documentation

## 2011-04-07 DIAGNOSIS — M545 Low back pain, unspecified: Secondary | ICD-10-CM | POA: Insufficient documentation

## 2011-04-07 DIAGNOSIS — F10929 Alcohol use, unspecified with intoxication, unspecified: Secondary | ICD-10-CM

## 2011-04-07 DIAGNOSIS — M899 Disorder of bone, unspecified: Secondary | ICD-10-CM | POA: Insufficient documentation

## 2011-04-07 DIAGNOSIS — R4789 Other speech disturbances: Secondary | ICD-10-CM | POA: Insufficient documentation

## 2011-04-07 DIAGNOSIS — F1010 Alcohol abuse, uncomplicated: Secondary | ICD-10-CM | POA: Insufficient documentation

## 2011-04-07 DIAGNOSIS — Z86718 Personal history of other venous thrombosis and embolism: Secondary | ICD-10-CM | POA: Insufficient documentation

## 2011-04-07 HISTORY — DX: Other chronic pain: G89.29

## 2011-04-07 HISTORY — DX: Spinal stenosis, site unspecified: M48.00

## 2011-04-07 HISTORY — DX: Acute embolism and thrombosis of unspecified deep veins of unspecified lower extremity: I82.409

## 2011-04-07 HISTORY — DX: Alcohol abuse, uncomplicated: F10.10

## 2011-04-07 HISTORY — DX: Pain in unspecified hip: M25.559

## 2011-04-07 HISTORY — DX: Chronic obstructive pulmonary disease, unspecified: J44.9

## 2011-04-07 HISTORY — DX: Other pulmonary embolism without acute cor pulmonale: I26.99

## 2011-04-07 LAB — CBC
HCT: 31.8 % — ABNORMAL LOW (ref 39.0–52.0)
Hemoglobin: 10.9 g/dL — ABNORMAL LOW (ref 13.0–17.0)
MCV: 93 fL (ref 78.0–100.0)
RBC: 3.42 MIL/uL — ABNORMAL LOW (ref 4.22–5.81)
WBC: 6.1 10*3/uL (ref 4.0–10.5)

## 2011-04-07 LAB — DIFFERENTIAL
Basophils Absolute: 0 10*3/uL (ref 0.0–0.1)
Eosinophils Relative: 1 % (ref 0–5)
Lymphocytes Relative: 46 % (ref 12–46)
Lymphs Abs: 2.8 10*3/uL (ref 0.7–4.0)
Monocytes Absolute: 0.3 10*3/uL (ref 0.1–1.0)
Monocytes Relative: 5 % (ref 3–12)
Neutro Abs: 3 10*3/uL (ref 1.7–7.7)

## 2011-04-07 LAB — BASIC METABOLIC PANEL
CO2: 22 mEq/L (ref 19–32)
Chloride: 109 mEq/L (ref 96–112)
Creatinine, Ser: 0.73 mg/dL (ref 0.50–1.35)
GFR calc Af Amer: >90 mL/min (ref >90)
Potassium: 4 mEq/L (ref 3.5–5.1)

## 2011-04-07 LAB — ETHANOL: Alcohol, Ethyl (B): 283 mg/dL — ABNORMAL HIGH (ref 0–11)

## 2011-04-07 MED ORDER — M.V.I. ADULT IV INJ
INJECTION | INTRAVENOUS | Status: AC
  Filled 2011-04-07: qty 20

## 2011-04-07 MED ORDER — FOLIC ACID 5 MG/ML IJ SOLN
INTRAMUSCULAR | Status: AC
  Filled 2011-04-07: qty 0.2

## 2011-04-07 MED ORDER — MAGNESIUM SULFATE 40 MG/ML IJ SOLN
2.0000 g | Freq: Once | INTRAMUSCULAR | Status: AC
  Administered 2011-04-07: 2 g via INTRAVENOUS
  Filled 2011-04-07: qty 50

## 2011-04-07 MED ORDER — THIAMINE HCL 100 MG/ML IJ SOLN
INTRAMUSCULAR | Status: AC
  Filled 2011-04-07: qty 2

## 2011-04-07 MED ORDER — THIAMINE HCL 100 MG/ML IJ SOLN
Freq: Once | INTRAVENOUS | Status: AC
  Administered 2011-04-07: 23:00:00 via INTRAVENOUS
  Filled 2011-04-07: qty 1000

## 2011-04-07 NOTE — ED Notes (Signed)
Patient lying in bed asleep with covers pulled over his head. Easily awakened. States his lower back is hurting because he fell asleep with his back uncovered last night and he believes the cold air has caused his back to hurt. Patient admits to drinking alcohol.

## 2011-04-07 NOTE — ED Provider Notes (Signed)
History     CSN: [id number]  Arrival date & time 04/07/11  1847   First MD Initiated Contact with Patient 04/07/11 2001      Chief Complaint  Patient presents with  . Back Pain   The history is provided by the patient and the EMS personnel. History Limited By: intoxicated.  Pt was seen at 2010.   Per EMS and pt, pt called EMS c/o gradual onset and persistence of constant left sided low back "pain" that began this morning after he woke up.  States he "had my back sticking out of the covers and it got cold last night."  Hx etoh abuse and endorses drinking etoh "all day" today.  Denies incont/retention of bowel or bladder, no saddle anesthesia, no focal motor weakness, no tingling/numbness in extremities, no fevers, no falls/injury.      Past Medical History  Diagnosis Date  . COPD (chronic obstructive pulmonary disease)   . Chronic hip pain   . Spinal stenosis   . DVT (deep venous thrombosis)   . PE (pulmonary embolism)   . Alcohol abuse     Past Surgical History  Procedure Date  . Total hip arthroplasty     History  Substance Use Topics  . Smoking status: Current Everyday Smoker    Types: Cigarettes  . Smokeless tobacco: Not on file  . Alcohol Use: Yes     daily      Review of Systems  Unable to perform ROS: Other    Allergies  Review of patient's allergies indicates no known allergies.  Home Medications   Current Outpatient Rx  Name Route Sig Dispense Refill  . MELOXICAM 15 MG PO TABS Oral Take 15 mg by mouth 2 (two) times daily.      . TRAMADOL HCL 50 MG PO TABS Oral Take 50 mg by mouth every 8 (eight) hours as needed. For pain       BP 96/60  Pulse 75  Temp(Src) 98.7 F (37.1 C) (Oral)  Resp 16  Ht 5\' 6"  (1.676 m)  Wt 139 lb (63.05 kg)  BMI 22.44 kg/m2  SpO2 97%  Physical Exam 2015: Physical examination:  Nursing notes reviewed; Vital signs and O2 SAT reviewed;  Constitutional: Thin, discheveled, In no acute distress; Head:  Normocephalic,  atraumatic; Eyes: EOMI, PERRL, No scleral icterus; ENMT: Mouth and pharynx normal, Mucous membranes dry; Neck: Supple, Full range of motion, No lymphadenopathy; Cardiovascular: Regular rate and rhythm, No murmur, rub, or gallop; Respiratory: Breath sounds clear & equal bilaterally, No rales, rhonchi, wheezes, or rub, Normal respiratory effort/excursion; Chest: Nontender, Movement normal; Abdomen: Soft, Nontender, Nondistended, Normal bowel sounds; Genitourinary: No CVA tenderness; Spine:  No midline CS, TS, LS tenderness. +TTP left lumbar paraspinal muscles.;  Extremities: Pulses normal, No tenderness, No edema, No calf edema or asymmetry.; Neuro: Awake, alert, intoxicated, argumentative.  Speech slurred.  No facial droop.  Major CN grossly intact.  No gross focal motor or sensory deficits in extremities.; Skin: Color normal, Warm, Dry, no rash.    ED Course  Procedures   MDM  MDM Reviewed: nursing note and vitals Interpretation: labs and x-ray   Results for orders placed during the hospital encounter of 04/07/11  BASIC METABOLIC PANEL      Component Value Range   Sodium 144  135 - 145 (mEq/L)   Potassium 4.0  3.5 - 5.1 (mEq/L)   Chloride 109  96 - 112 (mEq/L)   CO2 22  19 - 32 (mEq/L)  Glucose, Bld 61 (*) 70 - 99 (mg/dL)   BUN 14  6 - 23 (mg/dL)   Creatinine, Ser 1.61  0.50 - 1.35 (mg/dL)   Calcium 9.0  8.4 - 09.6 (mg/dL)   GFR calc non Af Amer 87 (*) >90 (mL/min)   GFR calc Af Amer >90  >90 (mL/min)  CBC      Component Value Range   WBC 6.1  4.0 - 10.5 (K/uL)   RBC 3.42 (*) 4.22 - 5.81 (MIL/uL)   Hemoglobin 10.9 (*) 13.0 - 17.0 (g/dL)   HCT 04.5 (*) 40.9 - 52.0 (%)   MCV 93.0  78.0 - 100.0 (fL)   MCH 31.9  26.0 - 34.0 (pg)   MCHC 34.3  30.0 - 36.0 (g/dL)   RDW 81.1  91.4 - 78.2 (%)   Platelets 248  150 - 400 (K/uL)  DIFFERENTIAL      Component Value Range   Neutrophils Relative 48  43 - 77 (%)   Neutro Abs 3.0  1.7 - 7.7 (K/uL)   Lymphocytes Relative 46  12 - 46 (%)    Lymphs Abs 2.8  0.7 - 4.0 (K/uL)   Monocytes Relative 5  3 - 12 (%)   Monocytes Absolute 0.3  0.1 - 1.0 (K/uL)   Eosinophils Relative 1  0 - 5 (%)   Eosinophils Absolute 0.1  0.0 - 0.7 (K/uL)   Basophils Relative 1  0 - 1 (%)   Basophils Absolute 0.0  0.0 - 0.1 (K/uL)  ETHANOL      Component Value Range   Alcohol, Ethyl (B) 283 (*) 0 - 11 (mg/dL)   Dg Lumbar Spine Complete 04/07/2011  *RADIOLOGY REPORT*  Clinical Data: Back pain  LUMBAR SPINE - COMPLETE 4+ VIEW  Comparison: CT abdomen and pelvis images from 05/06/2010  Findings: The partial compression deformities of L2, L3, and L4 vertebral bodies appear stable.  No new compression deformity is seen.  The bones are diffusely osteopenic.  Both large and small bowel gas is present which may indicate ileus.  Prior pinning of left hip fracture is noted.  IMPRESSION: Multiple old compression deformities.  Osteopenia.  No acute abnormality.  Original Report Authenticated By: Juline Patch, M.D.    Results for GASTON, DASE (MRN [id number]) as of 04/07/2011 22:26  Ref. Range 05/07/2010 06:20 05/08/2010 06:41 05/10/2010 05:39 09/26/2010 15:31 04/07/2011 20:22  HGB Latest Range: 13.0-17.0 g/dL 9.1 (L) 8.7 (L) 9.2 (L) 11.6 (L) 10.9 (L)  HCT Latest Range: 39.0-52.0 % 25.8 (L) 25.2 (L) 26.7 (L) 33.1 (L) 31.8 (L)      10:31 PM:  Pt continues belligerent and argumentative.  Does not seem to want detox.  Does not have a ride home.  Will hold in ED until can demonstrate sobriety.  Sign out to Dr. Preston Fleeting.       Laray Anger, DO 04/09/11 2357

## 2011-04-07 NOTE — ED Notes (Signed)
C/o lower back pain since this morning.  Denies injury.  Smells of ETOH, pt admits to drinking 1/2 pint of vodka.  Pt alert and oriented x4 at this time.

## 2011-04-08 LAB — URINALYSIS, ROUTINE W REFLEX MICROSCOPIC
Bilirubin Urine: NEGATIVE
Glucose, UA: NEGATIVE mg/dL
Ketones, ur: NEGATIVE mg/dL
Nitrite: NEGATIVE
Specific Gravity, Urine: 1.025 (ref 1.005–1.030)
pH: 5.5 (ref 5.0–8.0)

## 2011-04-08 LAB — RAPID URINE DRUG SCREEN, HOSP PERFORMED
Amphetamines: NOT DETECTED
Barbiturates: NOT DETECTED
Benzodiazepines: NOT DETECTED

## 2011-04-08 LAB — URINE MICROSCOPIC-ADD ON

## 2011-04-08 MED ORDER — ACETAMINOPHEN 325 MG PO TABS
650.0000 mg | ORAL_TABLET | Freq: Once | ORAL | Status: AC
  Administered 2011-04-08: 650 mg via ORAL
  Filled 2011-04-08: qty 2

## 2011-04-08 NOTE — Discharge Instructions (Signed)
Alcohol Intoxication  You have alcohol intoxication when the amount of alcohol that you have consumed has impaired your ability to mentally and physically function. There are a variety of factors that contribute to the level at which alcohol intoxication can occur, such as age, gender, weight, frequency of alcohol consumption, medication use, and the presence of other medical conditions, such as diabetes, seizures, or heart conditions.  The blood alcohol level test measures the concentration of alcohol in your blood. In most states, your blood alcohol level must be lower than 80 mg/dL (0.08%) to legally drive. However, many dangerous effects of alcohol can occur at much lower levels.  Alcohol directly impairs the normal chemical activity of the brain and is said to be a chemical depressant. Alcohol can cause drowsiness, stupor, respiratory failure, and coma. Other physical effects can include headache, vomiting, vomiting of blood, abdominal pain, a fast heartbeat, difficulty breathing, anxiety, and amnesia. Alcohol intoxication can also lead to dangerous and life-threatening activities, such as fighting, dangerous operation of vehicles or heavy machinery, and risky sexual behavior.  Alcohol can be especially dangerous when taken with other drugs. Some of these drugs are:   Sedatives.   Painkillers.   Marijuana.   Tranquilizers.   Antihistamines.   Muscle relaxants.   Seizure medicine.  Many of the effects of acute alcohol intoxication are temporary. However, repeated alcohol intoxication can lead to severe medical illnesses. If you have alcohol intoxication, you should:   Stay hydrated. Drink enough water and fluids to keep your urine clear or pale yellow. Avoid excessive caffeine because this can further lead to dehydration.   Eat a healthy diet. You may have residual nausea, headache, and loss of appetite, but it is still important that you maintain good nutrition. You can start with clear  liquids.   Take nonsteroidal anti-inflammatory medications as needed for headaches, but make sure to do so with small meals. You should avoid acetaminophen for several days after having alcohol intoxication because the combination of alcohol and acetaminophen can be toxic to your liver.  If you have frequent alcohol intoxication, ask your friends and family if they think you have a drinking problem. For further help, contact:   Your caregiver.   Alcoholics Anonymous (AA).   A drug or alcohol rehabilitation program.  SEEK MEDICAL CARE IF:    You have persistent vomiting.   You have persistent pain in any part of your body.   You do not feel better after a few days.  SEEK IMMEDIATE MEDICAL CARE IF:    You become shaky or tremble when you try to stop drinking.   You shake uncontrollably (seizure).   You throw up (vomit) blood. This may be bright red or it may look like black coffee grounds.   You have blood in the stool. This may be bright red or appear as a black, tarry, bad smelling stool.   You become lightheaded or faint.  ANY OF THESE SYMPTOMS MAY REPRESENT A SERIOUS PROBLEM THAT IS AN EMERGENCY. Do not wait to see if the symptoms will go away. Get medical help right away. Call your local emergency services (911 in [place] DO NOT drive yourself to the hospital.  MAKE SURE YOU:    Understand these instructions.   Will watch your condition.   Will get help right away if you are not doing well or get worse.  Document Released: 09/28/2004 Document Revised: 12/08/2010 Document Reviewed: 06/07/2009  [employer] Patient Information 2012 [employer].

## 2011-04-08 NOTE — ED Notes (Signed)
Gave patient coke, crackers, and peanut butter as requested.

## 2011-04-08 NOTE — ED Notes (Signed)
Patient lying in bed sleeping at this time. Rise and fall of chest noted. No obvious distress noted.

## 2011-04-08 NOTE — ED Provider Notes (Signed)
Care assumed from Dr. Clarene Duke at shift change.  Patient is resting comfortably.  He has proper time to sober up and is now appropriate for discharge.  He is no longer cursing and swearing at the staff.  For some reason, the patient is reluctant to leave, but has no reason to be in the ED any longer.  He will be discharged to home.    Geoffery Lyons, MD 04/08/11 [phone]

## 2011-04-10 LAB — URINE CULTURE
Colony Count: >=100000
Culture  Setup Time: 201304062027

## 2011-04-15 NOTE — ED Notes (Signed)
+   urine Rx for Macrobid 100 mg tab disp#14. One tab po BID x  7 days. Rosie Fate

## 2011-06-03 ENCOUNTER — Emergency Department: Payer: Self-pay | Admitting: Emergency Medicine

## 2011-06-03 DIAGNOSIS — T50905A Adverse effect of unspecified drugs, medicaments and biological substances, initial encounter: Secondary | ICD-10-CM

## 2011-06-03 DIAGNOSIS — R410 Disorientation, unspecified: Secondary | ICD-10-CM

## 2011-06-03 HISTORY — DX: Disorientation, unspecified: R41.0

## 2011-06-03 HISTORY — DX: Adverse effect of unspecified drugs, medicaments and biological substances, initial encounter: T50.905A

## 2011-06-03 LAB — TROPONIN I: Troponin-I: <0.02 ng/mL

## 2011-06-03 LAB — URINALYSIS, COMPLETE
Bilirubin,UR: NEGATIVE
Glucose,UR: NEGATIVE mg/dL (ref 0–75)
Hyaline Cast: 1
Ph: 5 (ref 4.5–8.0)
Protein: NEGATIVE
Specific Gravity: 1.011 (ref 1.003–1.030)
Squamous Epithelial: 2

## 2011-06-03 LAB — COMPREHENSIVE METABOLIC PANEL
Anion Gap: 11 (ref 7–16)
Bilirubin,Total: 0.3 mg/dL (ref 0.2–1.0)
Calcium, Total: 8 mg/dL — ABNORMAL LOW (ref 8.5–10.1)
Chloride: 108 mmol/L — ABNORMAL HIGH (ref 98–107)
Co2: 22 mmol/L (ref 21–32)
Creatinine: 0.97 mg/dL (ref 0.60–1.30)
EGFR (African American): >60
EGFR (Non-African Amer.): >60
Glucose: 64 mg/dL — ABNORMAL LOW (ref 65–99)
Osmolality: 279 (ref 275–301)
SGPT (ALT): 7 U/L — ABNORMAL LOW

## 2011-06-03 LAB — CBC
MCH: 32.9 pg (ref 26.0–34.0)
MCHC: 33.2 g/dL (ref 32.0–36.0)
Platelet: 182 10*3/uL (ref 150–440)
RDW: 19.7 % — ABNORMAL HIGH (ref 11.5–14.5)

## 2011-06-03 LAB — LIPASE, BLOOD: Lipase: 60 U/L — ABNORMAL LOW (ref 73–393)

## 2011-06-08 ENCOUNTER — Encounter (HOSPITAL_COMMUNITY): Payer: Self-pay | Admitting: Emergency Medicine

## 2011-06-08 ENCOUNTER — Emergency Department (HOSPITAL_COMMUNITY): Payer: Medicare Other

## 2011-06-08 ENCOUNTER — Inpatient Hospital Stay (HOSPITAL_COMMUNITY)
Admission: EM | Admit: 2011-06-08 | Discharge: 2011-06-12 | DRG: 552 | Disposition: A | Discharge status: Home Health | Payer: Medicare Other | Attending: Internal Medicine | Admitting: Internal Medicine

## 2011-06-08 DIAGNOSIS — E872 Acidosis, unspecified: Secondary | ICD-10-CM | POA: Diagnosis present

## 2011-06-08 DIAGNOSIS — D649 Anemia, unspecified: Secondary | ICD-10-CM | POA: Diagnosis present

## 2011-06-08 DIAGNOSIS — E440 Moderate protein-calorie malnutrition: Secondary | ICD-10-CM | POA: Diagnosis present

## 2011-06-08 DIAGNOSIS — D72819 Decreased white blood cell count, unspecified: Secondary | ICD-10-CM | POA: Diagnosis present

## 2011-06-08 DIAGNOSIS — Z96649 Presence of unspecified artificial hip joint: Secondary | ICD-10-CM

## 2011-06-08 DIAGNOSIS — M5126 Other intervertebral disc displacement, lumbar region: Principal | ICD-10-CM | POA: Diagnosis present

## 2011-06-08 DIAGNOSIS — S32000A Wedge compression fracture of unspecified lumbar vertebra, initial encounter for closed fracture: Secondary | ICD-10-CM | POA: Diagnosis present

## 2011-06-08 DIAGNOSIS — R627 Adult failure to thrive: Secondary | ICD-10-CM | POA: Diagnosis present

## 2011-06-08 DIAGNOSIS — E162 Hypoglycemia, unspecified: Secondary | ICD-10-CM | POA: Diagnosis present

## 2011-06-08 DIAGNOSIS — Z681 Body mass index (BMI) 19 or less, adult: Secondary | ICD-10-CM

## 2011-06-08 DIAGNOSIS — F10239 Alcohol dependence with withdrawal, unspecified: Secondary | ICD-10-CM | POA: Diagnosis present

## 2011-06-08 DIAGNOSIS — E782 Mixed hyperlipidemia: Secondary | ICD-10-CM

## 2011-06-08 DIAGNOSIS — R630 Anorexia: Secondary | ICD-10-CM | POA: Diagnosis present

## 2011-06-08 DIAGNOSIS — J4489 Other specified chronic obstructive pulmonary disease: Secondary | ICD-10-CM | POA: Diagnosis present

## 2011-06-08 DIAGNOSIS — F1010 Alcohol abuse, uncomplicated: Secondary | ICD-10-CM

## 2011-06-08 DIAGNOSIS — K5900 Constipation, unspecified: Secondary | ICD-10-CM | POA: Diagnosis not present

## 2011-06-08 DIAGNOSIS — M545 Low back pain, unspecified: Secondary | ICD-10-CM | POA: Diagnosis present

## 2011-06-08 DIAGNOSIS — N390 Urinary tract infection, site not specified: Secondary | ICD-10-CM | POA: Diagnosis present

## 2011-06-08 DIAGNOSIS — F10939 Alcohol use, unspecified with withdrawal, unspecified: Secondary | ICD-10-CM | POA: Diagnosis present

## 2011-06-08 DIAGNOSIS — R636 Underweight: Secondary | ICD-10-CM | POA: Diagnosis present

## 2011-06-08 DIAGNOSIS — E161 Other hypoglycemia: Secondary | ICD-10-CM

## 2011-06-08 DIAGNOSIS — D6489 Other specified anemias: Secondary | ICD-10-CM | POA: Diagnosis present

## 2011-06-08 DIAGNOSIS — R748 Abnormal levels of other serum enzymes: Secondary | ICD-10-CM | POA: Diagnosis present

## 2011-06-08 DIAGNOSIS — R5381 Other malaise: Secondary | ICD-10-CM | POA: Diagnosis present

## 2011-06-08 DIAGNOSIS — M48061 Spinal stenosis, lumbar region without neurogenic claudication: Secondary | ICD-10-CM | POA: Diagnosis present

## 2011-06-08 DIAGNOSIS — F19921 Other psychoactive substance use, unspecified with intoxication with delirium: Secondary | ICD-10-CM | POA: Diagnosis present

## 2011-06-08 DIAGNOSIS — F10929 Alcohol use, unspecified with intoxication, unspecified: Secondary | ICD-10-CM | POA: Diagnosis present

## 2011-06-08 DIAGNOSIS — Z720 Tobacco use: Secondary | ICD-10-CM | POA: Diagnosis present

## 2011-06-08 DIAGNOSIS — F1020 Alcohol dependence, uncomplicated: Secondary | ICD-10-CM | POA: Diagnosis present

## 2011-06-08 DIAGNOSIS — G8929 Other chronic pain: Secondary | ICD-10-CM | POA: Diagnosis present

## 2011-06-08 DIAGNOSIS — F17200 Nicotine dependence, unspecified, uncomplicated: Secondary | ICD-10-CM | POA: Diagnosis present

## 2011-06-08 DIAGNOSIS — M4800 Spinal stenosis, site unspecified: Secondary | ICD-10-CM | POA: Diagnosis present

## 2011-06-08 DIAGNOSIS — J449 Chronic obstructive pulmonary disease, unspecified: Secondary | ICD-10-CM | POA: Diagnosis present

## 2011-06-08 DIAGNOSIS — Z86711 Personal history of pulmonary embolism: Secondary | ICD-10-CM

## 2011-06-08 DIAGNOSIS — Z86718 Personal history of other venous thrombosis and embolism: Secondary | ICD-10-CM

## 2011-06-08 DIAGNOSIS — E871 Hypo-osmolality and hyponatremia: Secondary | ICD-10-CM | POA: Diagnosis present

## 2011-06-08 HISTORY — DX: Abnormal results of thyroid function studies: R94.6

## 2011-06-08 HISTORY — DX: Alcohol dependence with withdrawal, unspecified: F10.239

## 2011-06-08 HISTORY — DX: Elevated prostate specific antigen (PSA): R97.20

## 2011-06-08 HISTORY — DX: Disorder of bone, unspecified: M89.9

## 2011-06-08 HISTORY — DX: Pneumonia, unspecified organism: J18.9

## 2011-06-08 HISTORY — DX: Nutritional anemia, unspecified: D53.9

## 2011-06-08 HISTORY — DX: Other psychoactive substance use, unspecified with intoxication with delirium: F19.921

## 2011-06-08 HISTORY — DX: Wedge compression fracture of unspecified thoracic vertebra, initial encounter for closed fracture: S22.000A

## 2011-06-08 HISTORY — DX: Tobacco use: Z72.0

## 2011-06-08 LAB — CBC
Hemoglobin: 10.6 g/dL — ABNORMAL LOW (ref 13.0–17.0)
MCH: 32.7 pg (ref 26.0–34.0)
MCHC: 34 g/dL (ref 30.0–36.0)
Platelets: 223 10*3/uL (ref 150–400)
RDW: 19.2 % — ABNORMAL HIGH (ref 11.5–15.5)

## 2011-06-08 LAB — URINE MICROSCOPIC-ADD ON

## 2011-06-08 LAB — URINALYSIS, ROUTINE W REFLEX MICROSCOPIC
Ketones, ur: 15 mg/dL — AB
Nitrite: NEGATIVE
Urobilinogen, UA: 0.2 mg/dL (ref 0.0–1.0)

## 2011-06-08 LAB — COMPREHENSIVE METABOLIC PANEL
Albumin: 2.6 g/dL — ABNORMAL LOW (ref 3.5–5.2)
Alkaline Phosphatase: 111 U/L (ref 39–117)
BUN: 16 mg/dL (ref 6–23)
Potassium: 4.7 mEq/L (ref 3.5–5.1)
Total Protein: 5.9 g/dL — ABNORMAL LOW (ref 6.0–8.3)

## 2011-06-08 LAB — DIFFERENTIAL
Basophils Absolute: 0 10*3/uL (ref 0.0–0.1)
Basophils Relative: 0 % (ref 0–1)
Eosinophils Absolute: 0 10*3/uL (ref 0.0–0.7)
Monocytes Relative: 7 % (ref 3–12)
Neutrophils Relative %: 74 % (ref 43–77)

## 2011-06-08 LAB — GLUCOSE, CAPILLARY
Glucose-Capillary: 125 mg/dL — ABNORMAL HIGH (ref 70–99)
Glucose-Capillary: 45 mg/dL — ABNORMAL LOW (ref 70–99)

## 2011-06-08 LAB — RAPID URINE DRUG SCREEN, HOSP PERFORMED
Barbiturates: NOT DETECTED
Benzodiazepines: NOT DETECTED
Cocaine: NOT DETECTED

## 2011-06-08 MED ORDER — ACETAMINOPHEN 325 MG PO TABS: 650.0000 mg | ORAL_TABLET | Freq: Four times a day (QID) | ORAL | Status: DC | PRN

## 2011-06-08 MED ORDER — DEXTROSE 50 % IV SOLN
1.0000 | Freq: Once | INTRAVENOUS | Status: AC
  Administered 2011-06-08: 50 mL via INTRAVENOUS

## 2011-06-08 MED ORDER — LORAZEPAM 1 MG PO TABS
1.0000 mg | ORAL_TABLET | Freq: Four times a day (QID) | ORAL | Status: DC | PRN
  Administered 2011-06-08: 1 mg via ORAL
  Filled 2011-06-08: qty 1

## 2011-06-08 MED ORDER — ALUM & MAG HYDROXIDE-SIMETH 200-200-20 MG/5ML PO SUSP: 30.0000 mL | ORAL | Status: DC | PRN

## 2011-06-08 MED ORDER — SODIUM CHLORIDE 0.9 % IV BOLUS (SEPSIS)
1000.0000 mL | Freq: Once | INTRAVENOUS | Status: AC
  Administered 2011-06-08: 1000 mL via INTRAVENOUS

## 2011-06-08 MED ORDER — DEXTROSE 50 % IV SOLN
INTRAVENOUS | Status: AC
  Filled 2011-06-08: qty 50

## 2011-06-08 MED ORDER — THIAMINE HCL 100 MG/ML IJ SOLN
100.0000 mg | Freq: Once | INTRAMUSCULAR | Status: AC
  Administered 2011-06-08: 100 mg via INTRAVENOUS
  Filled 2011-06-08: qty 2

## 2011-06-08 MED ORDER — LEVOFLOXACIN IN D5W 500 MG/100ML IV SOLN
INTRAVENOUS | Status: AC
  Filled 2011-06-08: qty 100

## 2011-06-08 MED ORDER — NICOTINE 14 MG/24HR TD PT24
14.0000 mg | MEDICATED_PATCH | Freq: Every day | TRANSDERMAL | Status: DC
  Administered 2011-06-09 – 2011-06-12 (×4): 14 mg via TRANSDERMAL
  Filled 2011-06-08 (×4): qty 1

## 2011-06-08 MED ORDER — NICOTINE 21 MG/24HR TD PT24
21.0000 mg | MEDICATED_PATCH | Freq: Every day | TRANSDERMAL | Status: DC
  Administered 2011-06-08: 21 mg via TRANSDERMAL
  Filled 2011-06-08: qty 1

## 2011-06-08 MED ORDER — THIAMINE HCL 100 MG/ML IJ SOLN: 100.0000 mg | Freq: Every day | INTRAMUSCULAR | Status: DC

## 2011-06-08 MED ORDER — VITAMIN B-1 100 MG PO TABS
100.0000 mg | ORAL_TABLET | Freq: Every day | ORAL | Status: DC
  Administered 2011-06-09 – 2011-06-12 (×3): 100 mg via ORAL
  Filled 2011-06-08 (×3): qty 1

## 2011-06-08 MED ORDER — SODIUM CHLORIDE 0.9 % IJ SOLN
3.0000 mL | Freq: Two times a day (BID) | INTRAMUSCULAR | Status: DC
  Administered 2011-06-09 – 2011-06-12 (×6): 3 mL via INTRAVENOUS
  Filled 2011-06-08 (×6): qty 3

## 2011-06-08 MED ORDER — ACETAMINOPHEN 650 MG RE SUPP: 650.0000 mg | Freq: Four times a day (QID) | RECTAL | Status: DC | PRN

## 2011-06-08 MED ORDER — PANTOPRAZOLE SODIUM 40 MG IV SOLR
40.0000 mg | INTRAVENOUS | Status: DC
  Administered 2011-06-08: 40 mg via INTRAVENOUS
  Filled 2011-06-08: qty 40

## 2011-06-08 MED ORDER — PREDNISONE 20 MG PO TABS
20.0000 mg | ORAL_TABLET | Freq: Two times a day (BID) | ORAL | Status: DC
  Administered 2011-06-08 – 2011-06-09 (×3): 20 mg via ORAL
  Filled 2011-06-08 (×3): qty 1

## 2011-06-08 MED ORDER — MORPHINE SULFATE 4 MG/ML IJ SOLN
4.0000 mg | Freq: Once | INTRAMUSCULAR | Status: AC
  Administered 2011-06-08: 4 mg via INTRAVENOUS
  Filled 2011-06-08: qty 1

## 2011-06-08 MED ORDER — MORPHINE SULFATE 4 MG/ML IJ SOLN
4.0000 mg | INTRAMUSCULAR | Status: DC | PRN
  Administered 2011-06-11: 4 mg via INTRAVENOUS
  Filled 2011-06-08: qty 1

## 2011-06-08 MED ORDER — ACETAMINOPHEN 325 MG PO TABS: 650.0000 mg | ORAL_TABLET | ORAL | Status: DC | PRN

## 2011-06-08 MED ORDER — LORAZEPAM 2 MG/ML IJ SOLN
1.0000 mg | Freq: Four times a day (QID) | INTRAMUSCULAR | Status: DC | PRN
  Administered 2011-06-10: 0.5 mg via INTRAVENOUS
  Filled 2011-06-08 (×2): qty 1

## 2011-06-08 MED ORDER — KCL IN DEXTROSE-NACL 20-5-0.9 MEQ/L-%-% IV SOLN
INTRAVENOUS | Status: DC
  Administered 2011-06-08 – 2011-06-09 (×2): via INTRAVENOUS

## 2011-06-08 MED ORDER — DEXTROSE-NACL 5-0.45 % IV SOLN: INTRAVENOUS | Status: DC

## 2011-06-08 MED ORDER — LORAZEPAM 1 MG PO TABS: 1.0000 mg | ORAL_TABLET | Freq: Four times a day (QID) | ORAL | Status: DC | PRN

## 2011-06-08 MED ORDER — LEVALBUTEROL HCL 0.63 MG/3ML IN NEBU
0.6300 mg | INHALATION_SOLUTION | Freq: Four times a day (QID) | RESPIRATORY_TRACT | Status: DC
  Administered 2011-06-08: 0.63 mg via RESPIRATORY_TRACT
  Filled 2011-06-08 (×2): qty 3

## 2011-06-08 MED ORDER — GUAIFENESIN-DM 100-10 MG/5ML PO SYRP: 5.0000 mL | ORAL_SOLUTION | ORAL | Status: DC | PRN

## 2011-06-08 MED ORDER — LEVOFLOXACIN IN D5W 500 MG/100ML IV SOLN
500.0000 mg | INTRAVENOUS | Status: DC
  Administered 2011-06-08: 500 mg via INTRAVENOUS
  Filled 2011-06-08 (×4): qty 100

## 2011-06-08 MED ORDER — ADULT MULTIVITAMIN W/MINERALS CH
1.0000 | ORAL_TABLET | Freq: Every day | ORAL | Status: DC
  Administered 2011-06-08 – 2011-06-12 (×4): 1 via ORAL
  Filled 2011-06-08 (×4): qty 1

## 2011-06-08 MED ORDER — LORAZEPAM 2 MG/ML IJ SOLN: 1.0000 mg | Freq: Four times a day (QID) | INTRAMUSCULAR | Status: DC | PRN

## 2011-06-08 MED ORDER — VITAMIN B-1 100 MG PO TABS
100.0000 mg | ORAL_TABLET | Freq: Every day | ORAL | Status: DC
Start: 2011-06-08 — End: 2011-06-08
  Administered 2011-06-08: 100 mg via ORAL
  Filled 2011-06-08: qty 1

## 2011-06-08 MED ORDER — OXYCODONE HCL 5 MG PO TABS: 5.0000 mg | ORAL_TABLET | ORAL | Status: DC | PRN

## 2011-06-08 MED ORDER — LORAZEPAM 2 MG/ML IJ SOLN
0.0000 mg | Freq: Four times a day (QID) | INTRAMUSCULAR | Status: AC
  Administered 2011-06-09 (×3): 1 mg via INTRAVENOUS
  Administered 2011-06-10 (×2): 2 mg via INTRAVENOUS
  Administered 2011-06-10: 3 mg via INTRAVENOUS
  Filled 2011-06-08 (×3): qty 1
  Filled 2011-06-08: qty 2

## 2011-06-08 MED ORDER — FOLIC ACID 1 MG PO TABS
1.0000 mg | ORAL_TABLET | Freq: Every day | ORAL | Status: DC
  Administered 2011-06-08 – 2011-06-12 (×4): 1 mg via ORAL
  Filled 2011-06-08 (×4): qty 1

## 2011-06-08 MED ORDER — LORAZEPAM 2 MG/ML IJ SOLN
0.0000 mg | Freq: Two times a day (BID) | INTRAMUSCULAR | Status: DC
  Filled 2011-06-08 (×2): qty 1

## 2011-06-08 NOTE — ED Provider Notes (Addendum)
History   This chart was scribed for Jermaine Sou, MD by Brooks Sailors. The patient was seen in room APA18/[facility]18. Patient's care was started at 0916.   CSN: [id number]  Arrival date & time 06/08/11  0916   First MD Initiated Contact with Patient 06/08/11 [phone]      Chief Complaint  Patient presents with  . Back Pain  . Alcohol Intoxication  . Hypoglycemia    (Consider location/radiation/quality/duration/timing/severity/associated sxs/prior treatment) HPI Jermaine [name] is a 78 y.o. male who presents to the Emergency Department complaining of back pain onset three days ago. Pain is at lumbar area, nonradiating worse with active or passive movement. Pt says he has been urinating on himself because he cant get up because of back pain. Has wheelchair at home. Treated with pain pills prescribed in the past, without adequate relief Pt says getting up and moving aggravated the pain. Pt says he smokes and uses alcohol .last alcohol the posterior one month ago .Marland Kitchen Brought by EMS EMS obtained Accu-Chek which was 31, treated with D50 en route and says he hasnt eaten in two days. Denies drug use.   hypoglycemia. Pt uses wheelchair at home because of bad leg.    Past Medical History  Diagnosis Date  . COPD (chronic obstructive pulmonary disease)   . Chronic hip pain   . Spinal stenosis   . DVT (deep venous thrombosis)   . PE (pulmonary embolism)   . Alcohol abuse     Past Surgical History  Procedure Date  . Total hip arthroplasty     History reviewed. No pertinent family history.  History  Substance Use Topics  . Smoking status: Current Everyday Smoker    Types: Cigarettes  . Smokeless tobacco: Not on file  . Alcohol Use: Yes     daily      Review of Systems  Constitutional: Positive for appetite change (diminished appetite, hasn't eating in two days).  HENT: Negative.   Respiratory: Negative.   Cardiovascular: Negative.   Gastrointestinal: Negative.   Musculoskeletal:  Positive for back pain.  Skin: Negative.   Neurological: Negative.   Hematological: Negative.   Psychiatric/Behavioral: Negative.     Allergies  Review of patient's allergies indicates no known allergies.  Home Medications   Current Outpatient Rx  Name Route Sig Dispense Refill  . MELOXICAM 15 MG PO TABS Oral Take 15 mg by mouth 2 (two) times daily.      . TRAMADOL HCL 50 MG PO TABS Oral Take 50 mg by mouth every 8 (eight) hours as needed. For pain       BP 121/78  Pulse 85  Temp(Src) 97.7 F (36.5 C) (Oral)  Resp 18  Ht 5\' 11"  (1.803 m)  Wt 135 lb (61.236 kg)  BMI 18.83 kg/m2  SpO2 99%  Physical Exam  Nursing note and vitals reviewed. Constitutional: He appears well-developed and well-nourished.       Cachectic  HENT:  Head: Normocephalic and atraumatic.       Mucus membranes dry  Eyes: Conjunctivae are normal. Pupils are equal, round, and reactive to light.  Neck: Neck supple. No tracheal deviation present. No thyromegaly present.  Cardiovascular: Normal rate and regular rhythm.   No murmur heard. Pulmonary/Chest: Effort normal and breath sounds normal.  Abdominal: Soft. Bowel sounds are normal. He exhibits no distension. There is no tenderness.  Musculoskeletal: Normal range of motion. He exhibits tenderness. He exhibits no edema.       lower back pain  upon passive movement  Neurological: He is alert. Coordination normal.       Strength 5/5  Skin: Skin is warm and dry. No rash noted.  Psychiatric: He has a normal mood and affect.    ED Course  Procedures (including critical care time)  [phone] Patient informed of current plan for treatment and evaluation and agrees with plan at this time.    Labs Reviewed  GLUCOSE, CAPILLARY - Abnormal; Notable for the following:    Glucose-Capillary 125 (*)    All other components within normal limits   No results found.   No diagnosis found.  Results for orders placed during the hospital encounter of 06/08/11    GLUCOSE, CAPILLARY      Component Value Range   Glucose-Capillary 125 (*) 70 - 99 (mg/dL)  URINALYSIS, ROUTINE W REFLEX MICROSCOPIC      Component Value Range   Color, Urine YELLOW  YELLOW    APPearance CLEAR  CLEAR    Specific Gravity, Urine >1.030 (*) 1.005 - 1.030    pH 6.0  5.0 - 8.0    Glucose, UA NEGATIVE  NEGATIVE (mg/dL)   Hgb urine dipstick TRACE (*) NEGATIVE    Bilirubin Urine NEGATIVE  NEGATIVE    Ketones, ur 15 (*) NEGATIVE (mg/dL)   Protein, ur TRACE (*) NEGATIVE (mg/dL)   Urobilinogen, UA 0.2  0.0 - 1.0 (mg/dL)   Nitrite NEGATIVE  NEGATIVE    Leukocytes, UA SMALL (*) NEGATIVE   ETHANOL      Component Value Range   Alcohol, Ethyl (B) 88 (*) 0 - 11 (mg/dL)  CBC      Component Value Range   WBC 6.4  4.0 - 10.5 (K/uL)   RBC 3.24 (*) 4.22 - 5.81 (MIL/uL)   Hemoglobin 10.6 (*) 13.0 - 17.0 (g/dL)   HCT 82.9 (*) 56.2 - 52.0 (%)   MCV 96.3  78.0 - 100.0 (fL)   MCH 32.7  26.0 - 34.0 (pg)   MCHC 34.0  30.0 - 36.0 (g/dL)   RDW 13.0 (*) 86.5 - 15.5 (%)   Platelets 223  150 - 400 (K/uL)  DIFFERENTIAL      Component Value Range   Neutrophils Relative 74  43 - 77 (%)   Neutro Abs 4.7  1.7 - 7.7 (K/uL)   Lymphocytes Relative 19  12 - 46 (%)   Lymphs Abs 1.3  0.7 - 4.0 (K/uL)   Monocytes Relative 7  3 - 12 (%)   Monocytes Absolute 0.4  0.1 - 1.0 (K/uL)   Eosinophils Relative 0  0 - 5 (%)   Eosinophils Absolute 0.0  0.0 - 0.7 (K/uL)   Basophils Relative 0  0 - 1 (%)   Basophils Absolute 0.0  0.0 - 0.1 (K/uL)  COMPREHENSIVE METABOLIC PANEL      Component Value Range   Sodium 133 (*) 135 - 145 (mEq/L)   Potassium 4.7  3.5 - 5.1 (mEq/L)   Chloride 99  96 - 112 (mEq/L)   CO2 11 (*) 19 - 32 (mEq/L)   Glucose, Bld 60 (*) 70 - 99 (mg/dL)   BUN 16  6 - 23 (mg/dL)   Creatinine, Ser 7.84  0.50 - 1.35 (mg/dL)   Calcium 8.3 (*) 8.4 - 10.5 (mg/dL)   Total Protein 5.9 (*) 6.0 - 8.3 (g/dL)   Albumin 2.6 (*) 3.5 - 5.2 (g/dL)   AST 33  0 - 37 (U/L)   ALT 8  0 - 53 (U/L)  Alkaline Phosphatase 111  39 - 117 (U/L)   Total Bilirubin 0.2 (*) 0.3 - 1.2 (mg/dL)   GFR calc non Af Amer 87 (*) >90 (mL/min)   GFR calc Af Amer >90  >90 (mL/min)  URINE MICROSCOPIC-ADD ON      Component Value Range   WBC, UA 11-20  <3 (WBC/hpf)   Bacteria, UA RARE  RARE   URINE RAPID DRUG SCREEN (HOSP PERFORMED)      Component Value Range   Opiates POSITIVE (*) NONE DETECTED    Cocaine NONE DETECTED  NONE DETECTED    Benzodiazepines NONE DETECTED  NONE DETECTED    Amphetamines NONE DETECTED  NONE DETECTED    Tetrahydrocannabinol NONE DETECTED  NONE DETECTED    Barbiturates NONE DETECTED  NONE DETECTED    Dg Lumbar Spine Complete  06/08/2011  *RADIOLOGY REPORT*  Clinical Data: Diffuse back pain.  No known injury.  LUMBAR SPINE - COMPLETE 4+ VIEW  Comparison: 04/07/2011.  Findings: Five non-rib bearing lumbar vertebrae.  Interval approximately 20% L1 superior endplate compression deformity with mild bony retropulsion.  No significant change in previously demonstrated L2, L3, L4 and T10 vertebral compression deformities. No pars defects or subluxations.  Atheromatous arterial calcifications. Mild scoliosis.  Mild scoliosis.  Left hip fixation hardware.  IMPRESSION:  1. Interval approximately 20% L1 superior endplate compression fracture with mild bony retropulsion. 2.  Stable previously demonstrated T10, L2, L3 and L4 vertebral compression deformities.  Original Report Authenticated By: Darrol Angel, M.D.   Lumbar xrays reviewed by me and discussed with Dr. Lytle [place]. No further imaging waranted  Case management called to evaluate patient as patient has poor living situation abuse alcohol chronically as corroborated by his companion who arrived later MDM  Patient is amenable to alcohol detox ACT team called to evaluate patient alcohol detox urine for culture  Diagnosis#1 chronic backpain #2 chronic alcohol abuse     I personally performed the services described in this documentation,  which was scribed in my presence. The recorded information has been reviewed and considered.  Jermaine Sou, MD 06/08/11 1526   Addendum : Act team briefly evaluated patient and has determined that he would do better with a medical detox. Repeat cbdg  Was 45 at 1550 Medical decision making in light of recurrent hypoglycemia will arrange for 23 hour observation Spoke with Dr. Sherrie Mustache who accepts patient Additional diagnoses : hypoglycemia  Jermaine Sou, MD 06/08/11 1616  Jermaine Sou, MD 06/08/11 1644

## 2011-06-08 NOTE — ED Notes (Signed)
Social worker seen pt. And will speak with EDP

## 2011-06-08 NOTE — ED Notes (Signed)
Pt states having back pain for 3 days. Pt also has ETOH on board.

## 2011-06-08 NOTE — H&P (Signed)
Jermaine [name] MRN: [id number] DOB/AGE: 76-Mar-1933 45 y.o. Primary Care Physician:No primary provider on file. Admit date: 06/08/2011 Chief Complaint: Back pain. HPI: The patient is a 78 year old man with a history significant for alcohol abuse, pulmonary embolism diagnosed in May of 2012, and chronic low back pain, who presents to the emergency department with a chief complaint of low back pain. The patient acknowledges that he has had back pain for years. Over the past few days, his back pain has worsened. It is located in the lumbar area. It does not radiate to his legs, although, he does have chronic pain in his knees. He says the pain is aching and sharp and when he tries to move or bend at the hip, it hurts. Because of the pain, he has been virtually wheelchair-bound. He has had chronic weakness in his legs for the past year. He does not have numbness or tingling in his legs. He has been incontinent of his urine, only because he cannot get to the bathroom in time. He denies any recent falls. He denies any recent trauma. He says that he does not drink alcohol every day, but when his back pain intensifies, he does drink alcohol to ease the pain. He also takes over-the-counter aspirin, Advil, and icy hot with very little relief lately. He denies any recent difficulty swallowing or difficulty chewing. He has had a poor appetite and has not been eating very much lately. He denies abdominal pain, nausea, vomiting, fever, chills, headache, pain with urination, and constipation. He believes he has lost weight, but he does not know how much.  In the emergency department, he was evaluated by the social worker who was summoned by the emergency department physician to see if the patient could receive home health services. The ACT team was also asked to see the patient for alcohol detoxification, and apparently, the patient agreed. However, later when I discussed this with him, he stated that he had no problem with  abusing alcohol. In route to the emergency department, EMS checked his blood glucose. It was 31. He was given D50. When he arrived to the emergency department, his capillary blood glucose was 125. His lab data are significant for a lipase of 82, albumin of 26, hemoglobin of 10.6, and a blood alcohol level of 88. His urine drug screen is positive for opiates. His urinalysis reveals 11-20 WBCs. His lumbar x-ray reveals several thoracic compression fractures. His acute abdominal series reveals no acute abnormality, but it does reveal gas and stool in the rectum. He is being admitted for further evaluation and management.  Past Medical History  Diagnosis Date  . COPD (chronic obstructive pulmonary disease)   . Chronic hip pain     Avascular necrosis, bilateral hips.  Marland Kitchen Spinal stenosis   . DVT (deep venous thrombosis)   . PE (pulmonary embolism) May/2012.  Marland Kitchen Alcohol abuse   . Elevated PSA     Lost to followup.  . Macrocytic anemia   . Lytic lesion of bone on x-ray May/2012.    Negative bone scan.  . Bilateral pneumonia May/2012.  Marland Kitchen Thoracic compression fracture   . Tobacco abuse     Past Surgical History  Procedure Date  . Total hip arthroplasty     Left hip.    Prior to Admission medications   Medication Sig Start Date End Date Taking? Authorizing Provider  oxycodone (OXY-IR) 5 MG capsule Take 5 mg by mouth every 6 (six) hours as needed. pain   Yes  Historical Provider, MD    Allergies: No Known Allergies  History reviewed. No pertinent family history.  Social History: He lives in [place]. He is single. He has one grown son. He is retired. He smokes a half a pack of cigarettes per day. He does not drink alcohol daily, but when his back pain intensifies, he drinks gin. He does not drive. Denies illicit drug use. He has a friend/neighbor who goes to the grocery store for him. He is mostly wheelchair-bound.         ROS: As above in history present illness.  PHYSICAL  EXAM: Blood pressure 117/52, pulse 108, temperature 98.1 F (36.7 C), temperature source Oral, resp. rate 20, height 5\' 11"  (1.803 m), weight 61.236 kg (135 lb), SpO2 91.00%.  General: Alert 78 year old after American man who is lying in bed, in no acute distress. He is underweight and cachectic appearing. HEENT: Head is normocephalic, nontraumatic. Pupils are equal, round, and reactive to light. Extraocular movements are intact. Conjunctivae are clear. Sclerae are white. Tympanic membranes are obscured by cerumen bilaterally. Oropharynx reveals moist mucous membranes. Multiple missing teeth. No posterior exudates or erythema. Neck: Supple, no adenopathy, no thyromegaly, no JVD. Lungs: Few bronchus wheezes bilaterally. Heart: Distant S1, S2, with no murmurs rubs or gallops. Abdomen: Positive bowel sounds, soft, mildly tender in epigastrium, possible palpation of stool in the lower abdomen, no rigidity, no significant distention. Extremities: Arthritic changes noted at the knees. Diffuse muscle atrophy. Mild flexion contracture of both knees bilaterally. No acute hot red joints. Pedal pulses palpable. Back: Mild to moderate tenderness upon palpation of the thoracic and lumbar muscles. Sacrum: No pressure ulcers noted. Neurologic: He is alert and oriented x2. Cranial nerves II through XII are grossly intact.  Basic Metabolic Panel:  Basename 06/08/11 1016  NA 133*  K 4.7  CL 99  CO2 11*  GLUCOSE 60*  BUN 16  CREATININE 0.74  CALCIUM 8.3*  MG --  PHOS --   Liver Function Tests:  Basename 06/08/11 1016  AST 33  ALT 8  ALKPHOS 111  BILITOT 0.2*  PROT 5.9*  ALBUMIN 2.6*    Basename 06/08/11 1000  LIPASE 82*  AMYLASE --   No results found for this basename: AMMONIA:2 in the last 72 hours CBC:  Basename 06/08/11 1016  WBC 6.4  NEUTROABS 4.7  HGB 10.6*  HCT 31.2*  MCV 96.3  PLT 223   Cardiac Enzymes: No results found for this basename:  CKTOTAL:3,CKMB:3,CKMBINDEX:3,TROPONINI:3 in the last 72 hours BNP: No results found for this basename: PROBNP:3 in the last 72 hours D-Dimer: No results found for this basename: DDIMER:2 in the last 72 hours CBG:  Basename 06/08/11 1831 06/08/11 1633 06/08/11 1550 06/08/11 0921  GLUCAP 173* 316* 45* 125*   Hemoglobin A1C: No results found for this basename: HGBA1C in the last 72 hours Fasting Lipid Panel: No results found for this basename: CHOL,HDL,LDLCALC,TRIG,CHOLHDL,LDLDIRECT in the last 72 hours Thyroid Function Tests: No results found for this basename: TSH,T4TOTAL,FREET4,T3FREE,THYROIDAB in the last 72 hours Anemia Panel: No results found for this basename: VITAMINB12,FOLATE,FERRITIN,TIBC,IRON,RETICCTPCT in the last 72 hours Coagulation: No results found for this basename: LABPROT:2,INR:2 in the last 72 hours Urine Drug Screen: Drugs of Abuse     Component Value Date/Time   LABOPIA POSITIVE* 06/08/2011 1303   COCAINSCRNUR NONE DETECTED 06/08/2011 1303   LABBENZ NONE DETECTED 06/08/2011 1303   AMPHETMU NONE DETECTED 06/08/2011 1303   THCU NONE DETECTED 06/08/2011 1303   LABBARB NONE DETECTED 06/08/2011  1303    Alcohol Level:  Basename 06/08/11 1016  ETH 88*   Urinalysis:  Basename 06/08/11 1303  COLORURINE YELLOW  LABSPEC >1.030*  PHURINE 6.0  GLUCOSEU NEGATIVE  HGBUR TRACE*  BILIRUBINUR NEGATIVE  KETONESUR 15*  PROTEINUR TRACE*  UROBILINOGEN 0.2  NITRITE NEGATIVE  LEUKOCYTESUR SMALL*   Misc. Labs:    No results found for this or any previous visit (from the past 240 hour(s)).   Results for orders placed during the hospital encounter of 06/08/11 (from the past 48 hour(s))  GLUCOSE, CAPILLARY     Status: Abnormal   Collection Time   06/08/11  9:21 AM      Component Value Range Comment   Glucose-Capillary 125 (*) 70 - 99 (mg/dL)   LIPASE, BLOOD     Status: Abnormal   Collection Time   06/08/11 10:00 AM      Component Value Range Comment   Lipase 82 (*) 11 - 59  (U/L)   ETHANOL     Status: Abnormal   Collection Time   06/08/11 10:16 AM      Component Value Range Comment   Alcohol, Ethyl (B) 88 (*) 0 - 11 (mg/dL)   CBC     Status: Abnormal   Collection Time   06/08/11 10:16 AM      Component Value Range Comment   WBC 6.4  4.0 - 10.5 (K/uL)    RBC 3.24 (*) 4.22 - 5.81 (MIL/uL)    Hemoglobin 10.6 (*) 13.0 - 17.0 (g/dL)    HCT 16.1 (*) 09.6 - 52.0 (%)    MCV 96.3  78.0 - 100.0 (fL)    MCH 32.7  26.0 - 34.0 (pg)    MCHC 34.0  30.0 - 36.0 (g/dL)    RDW 04.5 (*) 40.9 - 15.5 (%)    Platelets 223  150 - 400 (K/uL)   DIFFERENTIAL     Status: Normal   Collection Time   06/08/11 10:16 AM      Component Value Range Comment   Neutrophils Relative 74  43 - 77 (%)    Neutro Abs 4.7  1.7 - 7.7 (K/uL)    Lymphocytes Relative 19  12 - 46 (%)    Lymphs Abs 1.3  0.7 - 4.0 (K/uL)    Monocytes Relative 7  3 - 12 (%)    Monocytes Absolute 0.4  0.1 - 1.0 (K/uL)    Eosinophils Relative 0  0 - 5 (%)    Eosinophils Absolute 0.0  0.0 - 0.7 (K/uL)    Basophils Relative 0  0 - 1 (%)    Basophils Absolute 0.0  0.0 - 0.1 (K/uL)   COMPREHENSIVE METABOLIC PANEL     Status: Abnormal   Collection Time   06/08/11 10:16 AM      Component Value Range Comment   Sodium 133 (*) 135 - 145 (mEq/L)    Potassium 4.7  3.5 - 5.1 (mEq/L)    Chloride 99  96 - 112 (mEq/L)    CO2 11 (*) 19 - 32 (mEq/L)    Glucose, Bld 60 (*) 70 - 99 (mg/dL)    BUN 16  6 - 23 (mg/dL)    Creatinine, Ser 8.11  0.50 - 1.35 (mg/dL)    Calcium 8.3 (*) 8.4 - 10.5 (mg/dL)    Total Protein 5.9 (*) 6.0 - 8.3 (g/dL)    Albumin 2.6 (*) 3.5 - 5.2 (g/dL)    AST 33  0 - 37 (U/L)  ALT 8  0 - 53 (U/L)    Alkaline Phosphatase 111  39 - 117 (U/L)    Total Bilirubin 0.2 (*) 0.3 - 1.2 (mg/dL)    GFR calc non Af Amer 87 (*) >90 (mL/min)    GFR calc Af Amer >90  >90 (mL/min)   URINALYSIS, ROUTINE W REFLEX MICROSCOPIC     Status: Abnormal   Collection Time   06/08/11  1:03 PM      Component Value Range Comment    Color, Urine YELLOW  YELLOW     APPearance CLEAR  CLEAR     Specific Gravity, Urine >1.030 (*) 1.005 - 1.030     pH 6.0  5.0 - 8.0     Glucose, UA NEGATIVE  NEGATIVE (mg/dL)    Hgb urine dipstick TRACE (*) NEGATIVE     Bilirubin Urine NEGATIVE  NEGATIVE     Ketones, ur 15 (*) NEGATIVE (mg/dL)    Protein, ur TRACE (*) NEGATIVE (mg/dL)    Urobilinogen, UA 0.2  0.0 - 1.0 (mg/dL)    Nitrite NEGATIVE  NEGATIVE     Leukocytes, UA SMALL (*) NEGATIVE    URINE MICROSCOPIC-ADD ON     Status: Normal   Collection Time   06/08/11  1:03 PM      Component Value Range Comment   WBC, UA 11-20  <3 (WBC/hpf)    Bacteria, UA RARE  RARE    URINE RAPID DRUG SCREEN (HOSP PERFORMED)     Status: Abnormal   Collection Time   06/08/11  1:03 PM      Component Value Range Comment   Opiates POSITIVE (*) NONE DETECTED     Cocaine NONE DETECTED  NONE DETECTED     Benzodiazepines NONE DETECTED  NONE DETECTED     Amphetamines NONE DETECTED  NONE DETECTED     Tetrahydrocannabinol NONE DETECTED  NONE DETECTED     Barbiturates NONE DETECTED  NONE DETECTED    GLUCOSE, CAPILLARY     Status: Abnormal   Collection Time   06/08/11  3:50 PM      Component Value Range Comment   Glucose-Capillary 45 (*) 70 - 99 (mg/dL)    Comment 1 Documented in Chart     GLUCOSE, CAPILLARY     Status: Abnormal   Collection Time   06/08/11  4:33 PM      Component Value Range Comment   Glucose-Capillary 316 (*) 70 - 99 (mg/dL)   GLUCOSE, CAPILLARY     Status: Abnormal   Collection Time   06/08/11  6:31 PM      Component Value Range Comment   Glucose-Capillary 173 (*) 70 - 99 (mg/dL)    Comment 1 Documented in Chart       Dg Lumbar Spine Complete  06/08/2011  *RADIOLOGY REPORT*  Clinical Data: Diffuse back pain.  No known injury.  LUMBAR SPINE - COMPLETE 4+ VIEW  Comparison: 04/07/2011.  Findings: Five non-rib bearing lumbar vertebrae.  Interval approximately 20% L1 superior endplate compression deformity with mild bony retropulsion.  No  significant change in previously demonstrated L2, L3, L4 and T10 vertebral compression deformities. No pars defects or subluxations.  Atheromatous arterial calcifications. Mild scoliosis.  Mild scoliosis.  Left hip fixation hardware.  IMPRESSION:  1. Interval approximately 20% L1 superior endplate compression fracture with mild bony retropulsion. 2.  Stable previously demonstrated T10, L2, L3 and L4 vertebral compression deformities.  Original Report Authenticated By: Darrol Angel, M.D.   Dg Abd Acute  W/chest  06/08/2011  *RADIOLOGY REPORT*  Clinical Data: 78 year old male with pain.  ACUTE ABDOMEN SERIES (ABDOMEN 2 VIEW & CHEST 1 VIEW)  Comparison: 06/08/2011 and earlier.  Findings: Chronically large lung volumes re-identified on the upright view of the chest.  No pneumothorax or pneumoperitoneum. No pulmonary edema, definite effusion or acute pulmonary opacity. Stable cardiac size and mediastinal contours.  Small bowel gas does appear increased.  Bowel loops in the epigastric region measure up to 35 mm diameter, but I favor the largest of these loops are colon.  No definite small bowel dilatation.  There is gas and stool in the rectum.  Osteopenia.  Chronic changes at the left hip with hardware which demonstrates evidence of loosening.  Chronic compression fractures in the spine better characterized on comparisons.  IMPRESSION: 1. Nonobstructed bowel gas pattern, no free air. 2. No acute cardiopulmonary abnormality.  Original Report Authenticated By: Harley Hallmark, M.D.    Impression:  Active Problems:  H N P-LUMBAR  SPINAL STENOSIS  LOW BACK PAIN  Hypoglycemia  Alcoholism /alcohol abuse  Compression fracture of lumbar vertebra  Hyponatremia  Alcohol intoxication  UTI (lower urinary tract infection)  Tobacco abuse  Underweight  Anorexia  Malnutrition of moderate degree     Plan: 1. He is status post D50. We'll continue dextrose infusion in the form of D5 normal saline. We'll monitor  his capillary blood glucose every hour for the next 6 hours. 2. Will start CIWA protocol and vitamin therapy. 3. Analgesics for back pain. Will also start a small steroid taper. 4. We'll start Levaquin for probable UTI. 5. Registered dietitian/nutrition consult for assistance with anorexia and malnutrition/underweight.  6. We'll start Protonix empirically given his history of alcohol abuse. 7. Xopenex nebulizer for a few bronchospasms. The patient was advised to stop smoking. He was advised to stop drinking alcohol completely. A nicotine patch will be placed. The clinical social worker will be consulted for substance abuse counseling and referrals. 8. Physical therapy consultation. The patient may need placement if eligible and applicable. 9. For further evaluation, we'll order TSH, free T4, vitamin B12 level, and ferritin. Urine cultures are to been ordered.      Edythe Riches 06/08/2011, 6:45 PM

## 2011-06-08 NOTE — Clinical Social Work Note (Signed)
CSW received referral from ED.  CSW spoke with MD who reports need is for home health. CSW spoke with [place], CM to notify her of situation and will sign off unless further CSW needs arise.  Derenda Fennel, [place] [phone]

## 2011-06-08 NOTE — ED Notes (Signed)
Pt given amp of D50 prior to arrival. Pt alert and oriented

## 2011-06-08 NOTE — ED Notes (Signed)
MD at bedside. 

## 2011-06-08 NOTE — ED Notes (Signed)
Patient transported to X-ray 

## 2011-06-08 NOTE — ED Notes (Signed)
ems states bs low and was given an amp of d50.

## 2011-06-08 NOTE — ED Notes (Signed)
Pt unable to urinate at this time. Nad. States pain no better yet.

## 2011-06-09 DIAGNOSIS — R64 Cachexia: Secondary | ICD-10-CM

## 2011-06-09 DIAGNOSIS — R748 Abnormal levels of other serum enzymes: Secondary | ICD-10-CM | POA: Diagnosis present

## 2011-06-09 DIAGNOSIS — E161 Other hypoglycemia: Secondary | ICD-10-CM

## 2011-06-09 DIAGNOSIS — D72819 Decreased white blood cell count, unspecified: Secondary | ICD-10-CM | POA: Diagnosis not present

## 2011-06-09 DIAGNOSIS — M545 Low back pain: Secondary | ICD-10-CM

## 2011-06-09 DIAGNOSIS — E782 Mixed hyperlipidemia: Secondary | ICD-10-CM

## 2011-06-09 DIAGNOSIS — E872 Acidosis: Secondary | ICD-10-CM | POA: Diagnosis present

## 2011-06-09 LAB — GLUCOSE, CAPILLARY
Glucose-Capillary: 443 mg/dL — ABNORMAL HIGH (ref 70–99)
Glucose-Capillary: 200 mg/dL — ABNORMAL HIGH (ref 70–99)

## 2011-06-09 LAB — LIPASE, BLOOD: Lipase: 59 U/L (ref 11–59)

## 2011-06-09 LAB — COMPREHENSIVE METABOLIC PANEL
ALT: 7 U/L (ref 0–53)
Albumin: 2.7 g/dL — ABNORMAL LOW (ref 3.5–5.2)
Alkaline Phosphatase: 105 U/L (ref 39–117)
BUN: 10 mg/dL (ref 6–23)
Potassium: 4.5 mEq/L (ref 3.5–5.1)
Sodium: 131 mEq/L — ABNORMAL LOW (ref 135–145)
Total Protein: 5.3 g/dL — ABNORMAL LOW (ref 6.0–8.3)

## 2011-06-09 LAB — T4, FREE: Free T4: 0.78 ng/dL — ABNORMAL LOW (ref 0.80–1.80)

## 2011-06-09 LAB — CBC
HCT: 30 % — ABNORMAL LOW (ref 39.0–52.0)
MCV: 92.6 fL (ref 78.0–100.0)
RDW: 18.5 % — ABNORMAL HIGH (ref 11.5–15.5)
WBC: 2.7 10*3/uL — ABNORMAL LOW (ref 4.0–10.5)

## 2011-06-09 LAB — TSH: TSH: 0.438 u[IU]/mL (ref 0.350–4.500)

## 2011-06-09 LAB — FERRITIN: Ferritin: 34 ng/mL (ref 22–322)

## 2011-06-09 MED ORDER — INSULIN ASPART 100 UNIT/ML ~~LOC~~ SOLN
0.0000 [IU] | Freq: Three times a day (TID) | SUBCUTANEOUS | Status: DC
  Administered 2011-06-09: 15 [IU] via SUBCUTANEOUS

## 2011-06-09 MED ORDER — SENNOSIDES-DOCUSATE SODIUM 8.6-50 MG PO TABS
1.0000 | ORAL_TABLET | Freq: Two times a day (BID) | ORAL | Status: DC
  Administered 2011-06-09 – 2011-06-12 (×5): 1 via ORAL
  Filled 2011-06-09 (×5): qty 1

## 2011-06-09 MED ORDER — ENSURE COMPLETE PO LIQD
237.0000 mL | Freq: Two times a day (BID) | ORAL | Status: DC
  Administered 2011-06-09 – 2011-06-12 (×5): 237 mL via ORAL

## 2011-06-09 MED ORDER — ALBUTEROL SULFATE (5 MG/ML) 0.5% IN NEBU: 2.5000 mg | INHALATION_SOLUTION | RESPIRATORY_TRACT | Status: DC | PRN

## 2011-06-09 MED ORDER — LEVOFLOXACIN 250 MG PO TABS
250.0000 mg | ORAL_TABLET | Freq: Every day | ORAL | Status: DC
  Administered 2011-06-09 – 2011-06-12 (×3): 250 mg via ORAL
  Filled 2011-06-09 (×3): qty 1

## 2011-06-09 MED ORDER — MAGNESIUM HYDROXIDE 400 MG/5ML PO SUSP
30.0000 mL | Freq: Two times a day (BID) | ORAL | Status: AC
  Administered 2011-06-09 (×2): 30 mL via ORAL
  Filled 2011-06-09 (×2): qty 30

## 2011-06-09 MED ORDER — LEVALBUTEROL HCL 0.63 MG/3ML IN NEBU
0.6300 mg | INHALATION_SOLUTION | Freq: Four times a day (QID) | RESPIRATORY_TRACT | Status: DC
  Administered 2011-06-09 – 2011-06-12 (×12): 0.63 mg via RESPIRATORY_TRACT
  Filled 2011-06-09 (×12): qty 3

## 2011-06-09 MED ORDER — INSULIN ASPART 100 UNIT/ML ~~LOC~~ SOLN: 0.0000 [IU] | Freq: Every day | SUBCUTANEOUS | Status: DC

## 2011-06-09 MED ORDER — POTASSIUM CHLORIDE IN NACL 20-0.9 MEQ/L-% IV SOLN
INTRAVENOUS | Status: DC
  Administered 2011-06-09 – 2011-06-11 (×3): via INTRAVENOUS

## 2011-06-09 MED ORDER — CALCIUM CARBONATE ANTACID 500 MG PO CHEW
1.0000 | CHEWABLE_TABLET | Freq: Two times a day (BID) | ORAL | Status: DC
  Administered 2011-06-09 – 2011-06-12 (×6): 200 mg via ORAL
  Filled 2011-06-09 (×6): qty 1

## 2011-06-09 MED ORDER — PANTOPRAZOLE SODIUM 40 MG PO TBEC: 40.0000 mg | DELAYED_RELEASE_TABLET | Freq: Two times a day (BID) | ORAL | Status: DC

## 2011-06-09 MED ORDER — PANTOPRAZOLE SODIUM 40 MG PO TBEC
40.0000 mg | DELAYED_RELEASE_TABLET | Freq: Every day | ORAL | Status: DC
  Administered 2011-06-09 – 2011-06-12 (×3): 40 mg via ORAL
  Filled 2011-06-09 (×3): qty 1

## 2011-06-09 NOTE — Progress Notes (Signed)
Subjective: The patient says that he is feeling all right. No complaints of abdominal pain. He did have a bowel movement following the addition of milk of magnesia and Senokot. He ate soup for lunch.  Objective: Vital signs in last 24 hours: Filed Vitals:   06/09/11 0735 06/09/11 0800 06/09/11 1126 06/09/11 1352  BP:    107/75  Pulse:  100  104  Temp:    97.5 F (36.4 C)  TempSrc:      Resp:    20  Height:      Weight:      SpO2: 99%  98% 100%    Intake/Output Summary (Last 24 hours) at 06/09/11 1523 Last data filed at 06/09/11 0830  Gross per 24 hour  Intake   2270 ml  Output    725 ml  Net   1545 ml    Weight change:   Physical exam: General: 78 year old cachectic-appearing after American man, who looks somewhat better today compared to yesterday in the emergency department. He is in no acute distress. Lungs: Occasional rhonchus wheezes. Breathing nonlabored. Heart: S1, S2, with no murmurs rubs or gallops. Mild tachycardia. Abdomen: Positive bowel sounds, softer, less stool, less tender, no distention. Extremities: No pedal edema. Noted flexion contractures at the knees left greater than right. No tenderness to palpation of the left hip. Back: Mildly tender to palpation over the lumbar muscles. Neurologic: He is alert and oriented x2. No signs of tremulousness. With the physical therapist, he is chronically debilitated. He is barely able to stand with assistance. He has good range of motion and strength of his upper extremities.  Lab Results: Basic Metabolic Panel:  Basename 06/09/11 0553 06/08/11 1016  NA 131* 133*  K 4.5 4.7  CL 100 99  CO2 22 11*  GLUCOSE 150* 60*  BUN 10 16  CREATININE 0.61 0.74  CALCIUM 8.2* 8.3*  MG -- --  PHOS -- --   Liver Function Tests:  Basename 06/09/11 0553 06/08/11 1016  AST 25 33  ALT 7 8  ALKPHOS 105 111  BILITOT 0.5 0.2*  PROT 5.3* 5.9*  ALBUMIN 2.7* 2.6*    Basename 06/09/11 0553 06/08/11 1000  LIPASE 59 82*    AMYLASE -- --   No results found for this basename: AMMONIA:2 in the last 72 hours CBC:  Basename 06/09/11 0553 06/08/11 1016  WBC 2.7* 6.4  NEUTROABS -- 4.7  HGB 10.6* 10.6*  HCT 30.0* 31.2*  MCV 92.6 96.3  PLT 217 223   Cardiac Enzymes: No results found for this basename: CKTOTAL:3,CKMB:3,CKMBINDEX:3,TROPONINI:3 in the last 72 hours BNP: No results found for this basename: PROBNP:3 in the last 72 hours D-Dimer: No results found for this basename: DDIMER:2 in the last 72 hours CBG:  Basename 06/09/11 0812 06/08/11 2144 06/08/11 1831 06/08/11 1633 06/08/11 1550 06/08/11 0921  GLUCAP 141* 107* 173* 316* 45* 125*   Hemoglobin A1C:  Basename 06/08/11 1900  HGBA1C 4.8   Fasting Lipid Panel: No results found for this basename: CHOL,HDL,LDLCALC,TRIG,CHOLHDL,LDLDIRECT in the last 72 hours Thyroid Function Tests:  Basename 06/08/11 1900  TSH 0.438  T4TOTAL --  FREET4 0.78*  T3FREE --  THYROIDAB --   Anemia Panel:  Basename 06/08/11 1900  VITAMINB12 318  FOLATE --  FERRITIN 34  TIBC --  IRON --  RETICCTPCT --   Coagulation: No results found for this basename: LABPROT:2,INR:2 in the last 72 hours Urine Drug Screen: Drugs of Abuse     Component Value Date/Time   LABOPIA  POSITIVE* 06/08/2011 1303   COCAINSCRNUR NONE DETECTED 06/08/2011 1303   LABBENZ NONE DETECTED 06/08/2011 1303   AMPHETMU NONE DETECTED 06/08/2011 1303   THCU NONE DETECTED 06/08/2011 1303   LABBARB NONE DETECTED 06/08/2011 1303    Alcohol Level:  Basename 06/08/11 1016  ETH 88*   Urinalysis:  Basename 06/08/11 1303  COLORURINE YELLOW  LABSPEC >1.030*  PHURINE 6.0  GLUCOSEU NEGATIVE  HGBUR TRACE*  BILIRUBINUR NEGATIVE  KETONESUR 15*  PROTEINUR TRACE*  UROBILINOGEN 0.2  NITRITE NEGATIVE  LEUKOCYTESUR SMALL*   Misc. Labs:   Micro: No results found for this or any previous visit (from the past 240 hour(s)).  Studies/Results: Dg Lumbar Spine Complete  06/08/2011  *RADIOLOGY REPORT*   Clinical Data: Diffuse back pain.  No known injury.  LUMBAR SPINE - COMPLETE 4+ VIEW  Comparison: 04/07/2011.  Findings: Five non-rib bearing lumbar vertebrae.  Interval approximately 20% L1 superior endplate compression deformity with mild bony retropulsion.  No significant change in previously demonstrated L2, L3, L4 and T10 vertebral compression deformities. No pars defects or subluxations.  Atheromatous arterial calcifications. Mild scoliosis.  Mild scoliosis.  Left hip fixation hardware.  IMPRESSION:  1. Interval approximately 20% L1 superior endplate compression fracture with mild bony retropulsion. 2.  Stable previously demonstrated T10, L2, L3 and L4 vertebral compression deformities.  Original Report Authenticated By: Darrol Angel, M.D.   Dg Abd Acute W/chest  06/08/2011  *RADIOLOGY REPORT*  Clinical Data: 78 year old male with pain.  ACUTE ABDOMEN SERIES (ABDOMEN 2 VIEW & CHEST 1 VIEW)  Comparison: 06/08/2011 and earlier.  Findings: Chronically large lung volumes re-identified on the upright view of the chest.  No pneumothorax or pneumoperitoneum. No pulmonary edema, definite effusion or acute pulmonary opacity. Stable cardiac size and mediastinal contours.  Small bowel gas does appear increased.  Bowel loops in the epigastric region measure up to 35 mm diameter, but I favor the largest of these loops are colon.  No definite small bowel dilatation.  There is gas and stool in the rectum.  Osteopenia.  Chronic changes at the left hip with hardware which demonstrates evidence of loosening.  Chronic compression fractures in the spine better characterized on comparisons.  IMPRESSION: 1. Nonobstructed bowel gas pattern, no free air. 2. No acute cardiopulmonary abnormality.  Original Report Authenticated By: Harley Hallmark, M.D.    Medications:  Scheduled:   . dextrose  1 ampule Intravenous Once  . feeding supplement  237 mL Oral BID BM  . folic acid  1 mg Oral Daily  . levalbuterol  0.63 mg  Nebulization QID  . levofloxacin (LEVAQUIN) IV  500 mg Intravenous Q24H  . LORazepam  0-4 mg Intravenous Q6H   Followed by  . LORazepam  0-4 mg Intravenous Q12H  . magnesium hydroxide  30 mL Oral BID  . multivitamin with minerals  1 tablet Oral Daily  . nicotine  14 mg Transdermal Daily  . pantoprazole (PROTONIX) IV  40 mg Intravenous Q24H  . predniSONE  20 mg Oral BID  . senna-docusate  1 tablet Oral BID  . sodium chloride  3 mL Intravenous Q12H  . thiamine  100 mg Oral Daily   Or  . thiamine  100 mg Intravenous Daily  . DISCONTD: dextrose 5 % and 0.45% NaCl   Intravenous STAT  . DISCONTD: levalbuterol  0.63 mg Nebulization Q6H  . DISCONTD: nicotine  21 mg Transdermal Daily  . DISCONTD: thiamine  100 mg Intravenous Daily  . DISCONTD: thiamine  100 mg  Oral Daily   Continuous:   . dextrose 5 % and 0.9 % NaCl with KCl 20 mEq/L 70 mL/hr at 06/09/11 1407   ZOX:[id number], acetaminophen, albuterol, alum & mag hydroxide-simeth, guaiFENesin-dextromethorphan, LORazepam, LORazepam, morphine injection, oxyCODONE, DISCONTD: acetaminophen, DISCONTD: LORazepam, DISCONTD: LORazepam  Assessment: Active Problems:  H N P-LUMBAR  SPINAL STENOSIS  LOW BACK PAIN  Hypoglycemia  Alcoholism /alcohol abuse  Compression fracture of lumbar vertebra  Hyponatremia  Alcohol intoxication  UTI (lower urinary tract infection)  Tobacco abuse  Underweight  Anorexia  Malnutrition of moderate degree  Metabolic acidosis  Elevated lipase  Leukopenia   1. Acute on chronic low back pain. Noted compression fractures with no history of recent falls according to him. This is likely from malnutrition and probable osteopenia. He is on small dosing of prednisone and when necessary oxycodone.   Hypoglycemia. This is likely from malnutrition and alcohol use/abuse. He is on dextrose. His capillary blood glucoses much better. His hemoglobin A1c is 4.8. Next  Alcoholism/alcohol abuse. He denies abusing alcohol.  He drinks alcohol for pain periodically. Currently, there are no signs of alcohol withdrawal syndrome. The Ativan alcohol withdrawal protocol has been ordered. He is on vitamin therapy.  Tobacco abuse. Tobacco cessation counseling ordered. Nicotine replacement therapy started. Urinary tract infection. Treating with IV Levaquin.  Metabolic acidosis. Resolved with IV fluid hydration.  Malnutrition/underweight/generalized anorexia. This may be secondary to overall failure to thrive and alcohol use. Also consider chronic pain. His appetite is better. He wants to try to eat solid food. Nutrition/registered dietitian consult is pending.  Elevated lipase. Probable nonclinical pancreatitis. His lipase level has normalized.   Constipation per abdominal x-ray yesterday. He is on laxative therapy now.  History of left hip replacement. The x-ray of his abdomen revealed hardware loosening, however, the patient has no significant pain. Because he is wheelchair bound and nonambulatory, we'll not consult orthopedic surgery.  Anemia. His ferritin level is low normal. His vitamin B12 level is within normal limits.  Leukopenia. May be in part dilutional as his white blood cell count was within normal limits on admission. This will continue to be followed. We'll order HIV serology for further assessment.  Generalized failure to thrive/deconditioning. Per the physical therapist, he is not a skilled nursing facility candidate as he is chronically wheelchair-bound. The patient was interested in assisted living, however, when he learned of the cough, he was not willing to give up his monthly check and he was not willing to give up his trailer.   Plan:  1. Continue current treatment including IV fluid hydration with dextrose, vitamin therapy, Ativan alcohol withdrawal protocol, and pain management. 2. We'll advance his diet to a dysphagia 3 diet. We'll add Ensure twice a day. We'll await the recommendations by the  registered dietitian. 3. We'll check his thyroid function and HIV serology pending. 4. We'll try to obtain as much home health assistance as possible. 5. Anticipate discharge tomorrow.   LOS: 1 day   Khalila Buechner 06/09/2011, 3:23 PM

## 2011-06-09 NOTE — Care Management Note (Addendum)
    Page 1 of 1   06/12/2011     2:47:47 PM   CARE MANAGEMENT NOTE 06/12/2011  Patient:  Jermaine [name], Jermaine [name]   Account Number:  [id number]  Date Initiated:  06/09/2011  Documentation initiated by:  Rosemary Holms  Subjective/Objective Assessment:   Pt admitted with back pain. Lives home alone. Spoke with pt. Does not have a PCP or transportation.     Action/Plan:   CM made an appt for pt at Dr. Letitia Neri office for Monday at 11 am. [facility] will transport him. Will arrange HH per order.   Anticipated DC Date:  06/12/2011   Anticipated DC Plan:  HOME W HOME HEALTH SERVICES      DC Planning Services  CM consult      Choice offered to / List presented to:          [facility] arranged  HH-1 RN  HH-10 DISEASE MANAGEMENT  HH-6 SOCIAL WORKER      HH agency  [facility]   Status of service:  Completed, signed off Medicare Important Message given?   (If response is "NO", the following Medicare IM given date fields will be blank) Date Medicare IM given:   Date Additional Medicare IM given:    Discharge Disposition:  HOME W HOME HEALTH SERVICES  Per UR Regulation:    If discussed at Long Length of Stay Meetings, dates discussed:    Comments:  06/12/11 1130 Alizah Sills RN BSN CM Pt did not dc as planned over the weekend. PCP appt with [facility] rescheduled for Monday 17th @ 10:30. [facility] will pick him up at 9:30 for transport via [facility]. [facility] aware to begin [facility] RN and SW ASAP. [facility] but pt to be DC'd today. Notified of DC plan.Neighbors to come around 4pm to take pt home. Medication assistance for antibiotic with [employer] given.  06/09/11 1130 Genaro Bekker Leanord Hawking RN BSN CM see above arrangements

## 2011-06-09 NOTE — Evaluation (Signed)
Physical Therapy Evaluation Patient Details Name: Jermaine [name] MRN: [id number] DOB: 06/29/1931 Today's Date: 06/09/2011 Time: 4034-7425 PT Time Calculation (min): 25 min  PT Assessment / Plan / Recommendation Clinical Impression  Pt lives alone, is w/c dependent for at least a year/  He is probably at max functional potential due to bilateral hip and knee flexion contractures.  He is in a high risk situation as he is unable to enter or exit his home without assistance, and it is questionable as to how well he is thriving.  I would recommend ACLF for this pt.           PT Assessment  Patent does not need any further PT services    Follow Up Recommendations  No PT follow up    Barriers to Discharge        lEquipment Recommendations       Recommendations for Other Services     Frequency      Precautions / Restrictions Precautions Precautions: Fall Restrictions Weight Bearing Restrictions: No   Pertinent Vitals/Pain       Mobility  Bed Mobility Bed Mobility: Supine to Sit;Sit to Supine Supine to Sit: 6: Modified independent (Device/Increase time);HOB elevated Sit to Supine: 6: Modified independent (Device/Increase time);HOB elevated Transfers Transfers: Heritage manager Transfers: 7: Independent;With upper extremity assistance Ambulation/Gait Ambulation/Gait Assistance: Not tested (comment)    Exercises     PT Diagnosis:    PT Problem List:   PT Treatment Interventions:     PT Goals    Visit Information  Last PT Received On: 06/09/11    Subjective Data  Subjective: I live alone Patient Stated Goal: none reported   Prior Functioning  Home Living Lives With: Alone Available Help at Discharge: Family Type of Home: Mobile home Home Access: Stairs to enter Entrance Stairs-Number of Steps: 3 Entrance Stairs-Rails: Right Home Layout: One level Bathroom Shower/Tub: Engineer, manufacturing systems: Standard Home Adaptive Equipment: Walker -  rolling;Bedside commode/3-in-1;Wheelchair - manual Additional Comments: Pt is w/c dependent siince he fx's his L hip...he reports that he gets up/down his steps at home by having someone to lean on, but he avoids leaving the house Prior Function Level of Independence: Independent with assistive device(s) Able to Take Stairs?: Yes Driving: No Vocation: Retired Musician: No difficulties Dominant Hand: Right    Cognition  Overall Cognitive Status: Appears within functional limits for tasks assessed/performed Arousal/Alertness: Awake/alert Orientation Level: Appears intact for tasks assessed Behavior During Session: [facility] for tasks performed    Extremity/Trunk Assessment Right Upper Extremity Assessment RUE ROM/Strength/Tone: WFL for tasks assessed RUE Sensation: WFL - Light Touch;WFL - Proprioception RUE Coordination: WFL - gross motor Left Upper Extremity Assessment LUE ROM/Strength/Tone: WFL for tasks assessed LUE Sensation: WFL - Light Touch;WFL - Proprioception LUE Coordination: WFL - gross motor Right Lower Extremity Assessment RLE ROM/Strength/Tone: Deficits RLE ROM/Strength/Tone Deficits: hip and knee flexion contractures of about 40 deg...strength is functional for bed mobility and transfers RLE Sensation: WFL - Light Touch;WFL - Proprioception RLE Coordination: WFL - gross motor Left Lower Extremity Assessment LLE ROM/Strength/Tone: Deficits LLE ROM/Strength/Tone Deficits: hip and knee flexion contractures of 20 deg LLE Sensation: WFL - Light Touch;WFL - Proprioception LLE Coordination: WFL - gross motor Trunk Assessment Trunk Assessment: Kyphotic   Balance Balance Balance Assessed: No  End of Session PT - End of Session Equipment Utilized During Treatment: Gait belt Activity Tolerance: Patient tolerated treatment well Patient left: in bed;with call bell/phone within reach;with bed alarm  set;with nursing in room Nurse Communication: Mobility status    Konrad Penta 06/09/2011, 9:21 AM

## 2011-06-09 NOTE — Progress Notes (Signed)
INITIAL ADULT NUTRITION ASSESSMENT Date: 06/09/2011   Time: 4:56 PM Reason for Assessment: Consult   ASSESSMENT: Male 78 y.o.  Dx: <principal problem not specified>   Past Medical History  Diagnosis Date  . COPD (chronic obstructive pulmonary disease)   . Chronic hip pain     Avascular necrosis, bilateral hips.  Marland Kitchen Spinal stenosis   . DVT (deep venous thrombosis)   . PE (pulmonary embolism) May/2012.  Marland Kitchen Alcohol abuse   . Elevated PSA     Lost to followup.  . Macrocytic anemia   . Lytic lesion of bone on x-ray May/2012.    Negative bone scan.  . Bilateral pneumonia May/2012.  Marland Kitchen Thoracic compression fracture   . Tobacco abuse    Related Meds:  Scheduled Meds:   . calcium carbonate  1 tablet Oral BID  . feeding supplement  237 mL Oral BID BM  . folic acid  1 mg Oral Daily  . levalbuterol  0.63 mg Nebulization QID  . levofloxacin  250 mg Oral Daily  . LORazepam  0-4 mg Intravenous Q6H   Followed by  . LORazepam  0-4 mg Intravenous Q12H  . magnesium hydroxide  30 mL Oral BID  . multivitamin with minerals  1 tablet Oral Daily  . nicotine  14 mg Transdermal Daily  . pantoprazole  40 mg Oral Daily  . predniSONE  20 mg Oral BID  . senna-docusate  1 tablet Oral BID  . sodium chloride  3 mL Intravenous Q12H  . thiamine  100 mg Oral Daily   Or  . thiamine  100 mg Intravenous Daily  . DISCONTD: dextrose 5 % and 0.45% NaCl   Intravenous STAT  . DISCONTD: levalbuterol  0.63 mg Nebulization Q6H  . DISCONTD: levofloxacin (LEVAQUIN) IV  500 mg Intravenous Q24H  . DISCONTD: nicotine  21 mg Transdermal Daily  . DISCONTD: pantoprazole  40 mg Oral BID AC  . DISCONTD: pantoprazole (PROTONIX) IV  40 mg Intravenous Q24H  . DISCONTD: thiamine  100 mg Intravenous Daily  . DISCONTD: thiamine  100 mg Oral Daily   Continuous Infusions:   . dextrose 5 % and 0.9 % NaCl with KCl 20 mEq/L 70 mL/hr at 06/09/11 1407   PRN Meds:.acetaminophen, acetaminophen, albuterol, alum & mag  hydroxide-simeth, guaiFENesin-dextromethorphan, LORazepam, LORazepam, morphine injection, oxyCODONE, DISCONTD: acetaminophen, DISCONTD: LORazepam, DISCONTD: LORazepam  Ht: 5\' 11"  (180.3 cm)  Wt: 134 lb 14.7 oz (61.2 kg)  Ideal Wt:75.3 kg  % Ideal Wt: 82%  Usual Wt:145# % Usual Wt: 93%  Body mass index is 18.82 kg/(m^2). Underweight  Food/Nutrition Related Hx: Pt with hx of alcohol abuse and wt loss. Reports that he lives alone and prepares his own food. Feeds himself. Inadequate oral intake.  He meets criteria for Malnutrition in social/environmental context given his mild to moderate depletion of body fat and muscle mass and unplanned wt loss.  CMP     Component Value Date/Time   NA 131* 06/09/2011 0553   K 4.5 06/09/2011 0553   CL 100 06/09/2011 0553   CO2 22 06/09/2011 0553   GLUCOSE 150* 06/09/2011 0553   BUN 10 06/09/2011 0553   CREATININE 0.61 06/09/2011 0553   CALCIUM 8.2* 06/09/2011 0553   PROT 5.3* 06/09/2011 0553   ALBUMIN 2.7* 06/09/2011 0553   AST 25 06/09/2011 0553   ALT 7 06/09/2011 0553   ALKPHOS 105 06/09/2011 0553   BILITOT 0.5 06/09/2011 0553   GFRNONAA >90 06/09/2011 0553   GFRAA >90 06/09/2011 1610  Intake/Output Summary (Last 24 hours) at 06/09/11 1700 Last data filed at 06/09/11 0830  Gross per 24 hour  Intake   1270 ml  Output    725 ml  Net    545 ml      Diet Order: Dysphagia III with thin liquids  Supplements/Tube Feeding: Ensure Complete BID (700kcal, 26 gr protein) daily if 100% is consumed  IVF:    dextrose 5 % and 0.9 % NaCl with KCl 20 mEq/L Last Rate: 70 mL/hr at 06/09/11 1407    Estimated Nutritional Needs:   Kcal:1830-2013 kcal/day Protein:79-91 gr/day Fluid:1 ml/kcal  NUTRITION DIAGNOSIS: -Malnutrition (NI-5.2).  Status: Ongoing  RELATED TO: inadequate oral intake  AS EVIDENCE BY: pt diet hx and unintended wt loss   MONITORING/EVALUATION(Goals): Goal: pt will consume >80% of est nutritional needs daily  EDUCATION NEEDS: -Education needs  addressed-emphasized the importance of consistent intake of healthy foods: fruits, vegetables lean meats and whole grains.  INTERVENTION: -Agree with Ensure BID and vitamin therapy. It would be beneficial for him to continue after d/c but cost of products may prohibit this for him. -Rec if possible pt be referred to food bank such as [employer] and connected with Meals on wheels program locally. -Provide information for [employer]  Dietitian [phone]  DOCUMENTATION CODES Per approved criteria  -Non-severe (moderate) malnutrition in the context of social or environmental circumstances    Francene Boyers 06/09/2011, 4:56 PM

## 2011-06-09 NOTE — Clinical Social Work Psychosocial (Signed)
Clinical Social Work Department BRIEF PSYCHOSOCIAL ASSESSMENT 06/09/2011  Patient:  Jermaine [name], Jermaine [name]     Account Number:  [id number]     Admit date:  06/08/2011  Clinical Social Worker:  Nancie Neas  Date/Time:  06/09/2011 09:30 AM  Referred by:  Physician  Date Referred:  06/09/2011 Referred for  ALF Placement  Substance Abuse   Other Referral:   Interview type:  Patient Other interview type:    PSYCHOSOCIAL DATA Living Status:  ALONE Admitted from facility:   Level of care:   Primary support name:  Maurine Minister Primary support relationship to patient:  FAMILY Degree of support available:   nephew- supportive per pt    CURRENT CONCERNS Current Concerns  Post-Acute Placement  Substance Abuse   Other Concerns:    SOCIAL WORK ASSESSMENT / PLAN CSW met with pt at bedside following MD referral for substance abuse and assisted living facility placement.  Pt alert and orieted and reports he lives at home alone.  He states he has some difficulty at home.  He uses a wheelchair and is able to transfer.  CSW discussed ALF/Rest home with pt and he was initially agreeable, stating he would like to go to Home Away From Home.  CSW discussed payment with pt and he reports he has [employer].  CSW informed pt that ALF is private pay or Medicaid only.  Pt said he cannot afford private pay and he is not willing to give up his check in order to go to ALF.  CSW discussed with pt that the facility would provide housing, food, and medications so pt would have relatively few other needs. He is still not willing to consider this option.  CSW also discussed ETOH use. Pt had ACT team consult in ED but no documentation in chart. Pt states he has been drinking his whole life.  He drinks gin and vodka, particularly to help with pain related to his chronic back issues. He was unable to tell CSW how much or how often. SBIRT not completed as pt did not want to discuss since he feels this is not an issue. Pt is not interested  in any outpatient follow up. He reports a nephew and cousin that live nearby who are supportive. Pt states he can do some cooking.   Assessment/plan status:  No Further Intervention Required Other assessment/ plan:   Information/referral to community resources:   Outpatient counseling for substance abuse- pt refused  CM for home health/Social worker requested by CSW  Referral to pain clinic discussed with MD    PATIENT'S/FAMILY'S RESPONSE TO PLAN OF CARE: Pt reports he will have to return home since staying at a rest home is not worth his check.  He states he has all equipment that he needs and is open to home health. CM aware and CSW will notify MD of above and sign off.        Derenda Fennel, [place] [phone]

## 2011-06-09 NOTE — Progress Notes (Signed)
Called Dietician, Larita Fife, for consult; stated she would see patient this afternoon

## 2011-06-10 ENCOUNTER — Encounter (HOSPITAL_COMMUNITY): Payer: Self-pay | Admitting: Internal Medicine

## 2011-06-10 DIAGNOSIS — F10939 Alcohol use, unspecified with withdrawal, unspecified: Secondary | ICD-10-CM

## 2011-06-10 DIAGNOSIS — F10239 Alcohol dependence with withdrawal, unspecified: Secondary | ICD-10-CM

## 2011-06-10 DIAGNOSIS — F1010 Alcohol abuse, uncomplicated: Secondary | ICD-10-CM

## 2011-06-10 DIAGNOSIS — R64 Cachexia: Secondary | ICD-10-CM

## 2011-06-10 DIAGNOSIS — M545 Low back pain: Secondary | ICD-10-CM

## 2011-06-10 HISTORY — DX: Alcohol dependence with withdrawal, unspecified: F10.239

## 2011-06-10 HISTORY — DX: Alcohol use, unspecified with withdrawal, unspecified: F10.939

## 2011-06-10 LAB — GLUCOSE, CAPILLARY
Glucose-Capillary: 114 mg/dL — ABNORMAL HIGH (ref 70–99)
Glucose-Capillary: 99 mg/dL (ref 70–99)
Glucose-Capillary: 103 mg/dL — ABNORMAL HIGH (ref 70–99)

## 2011-06-10 LAB — BASIC METABOLIC PANEL
CO2: 24 mEq/L (ref 19–32)
Calcium: 8.8 mg/dL (ref 8.4–10.5)
GFR calc non Af Amer: 85 mL/min — ABNORMAL LOW (ref >90)
Potassium: 4.4 mEq/L (ref 3.5–5.1)
Sodium: 132 mEq/L — ABNORMAL LOW (ref 135–145)

## 2011-06-10 LAB — URINE CULTURE: Colony Count: 50000

## 2011-06-10 LAB — CBC
MCV: 93.1 fL (ref 78.0–100.0)
Platelets: 190 10*3/uL (ref 150–400)
RBC: 2.9 MIL/uL — ABNORMAL LOW (ref 4.22–5.81)
WBC: 2.9 10*3/uL — ABNORMAL LOW (ref 4.0–10.5)

## 2011-06-10 MED ORDER — HALOPERIDOL LACTATE 5 MG/ML IJ SOLN
2.5000 mg | Freq: Once | INTRAMUSCULAR | Status: AC
  Administered 2011-06-10: 2.5 mg via INTRAVENOUS
  Filled 2011-06-10: qty 1

## 2011-06-10 NOTE — Progress Notes (Signed)
Subjective: The patient is confused and delirious. He began having confusion and some visual hallucination this morning according to the nursing staff. He has been repeatedly trying to get out of bed. He was given a total of 3 mg of Ativan this morning. He appears intoxicated.  Objective: Vital signs in last 24 hours: Filed Vitals:   06/09/11 2100 06/10/11 0200 06/10/11 0539 06/10/11 1427  BP: 95/63 102/78 124/82 134/71  Pulse: 107 102 70 66  Temp:   97.1 F (36.2 C) 97.3 F (36.3 C)  TempSrc:   Oral Oral  Resp:   20 20  Height:      Weight:      SpO2:   99% 100%    Intake/Output Summary (Last 24 hours) at 06/10/11 1451 Last data filed at 06/10/11 0600  Gross per 24 hour  Intake   1020 ml  Output    200 ml  Net    820 ml    Weight change:   Physical exam: General: 78 year old cachectic-appearing after American man, who is clearly confused and delirious. Lungs: Occasional rhonchus wheezes. Breathing nonlabored. Heart: S1, S2, with no murmurs rubs or gallops. Mild tachycardia. Abdomen: Positive bowel sounds, softer, less stool, less tender, no distention. Extremities: No pedal edema. Noted flexion contractures at the knees left greater than right. No tenderness to palpation of the left hip. Back: Mildly tender to palpation over the lumbar muscles. Neurologic: He is clearly different from yesterday. He is intoxicated appearing. He is slurring his speech. He is trying to get out of bed. He is alert but he is not oriented to time place or city. He is not tremulous. He follows directions intermittently.  Lab Results: Basic Metabolic Panel:  Basename 06/10/11 0508 06/09/11 0553  NA 132* 131*  K 4.4 4.5  CL 100 100  CO2 24 22  GLUCOSE 128* 150*  BUN 7 10  CREATININE 0.78 0.61  CALCIUM 8.8 8.2*  MG -- --  PHOS -- --   Liver Function Tests:  Basename 06/09/11 0553 06/08/11 1016  AST 25 33  ALT 7 8  ALKPHOS 105 111  BILITOT 0.5 0.2*  PROT 5.3* 5.9*  ALBUMIN 2.7* 2.6*     Basename 06/09/11 0553 06/08/11 1000  LIPASE 59 82*  AMYLASE -- --   No results found for this basename: AMMONIA:2 in the last 72 hours CBC:  Basename 06/10/11 0508 06/09/11 0553 06/08/11 1016  WBC 2.9* 2.7* --  NEUTROABS -- -- 4.7  HGB 9.6* 10.6* --  HCT 27.0* 30.0* --  MCV 93.1 92.6 --  PLT 190 217 --   Cardiac Enzymes: No results found for this basename: CKTOTAL:3,CKMB:3,CKMBINDEX:3,TROPONINI:3 in the last 72 hours BNP: No results found for this basename: PROBNP:3 in the last 72 hours D-Dimer: No results found for this basename: DDIMER:2 in the last 72 hours CBG:  Basename 06/10/11 1208 06/10/11 0723 06/09/11 2116 06/09/11 1819 06/09/11 0812 06/08/11 2144  GLUCAP 125* 114* 200* 443* 141* 107*   Hemoglobin A1C:  Basename 06/08/11 1900  HGBA1C 4.8   Fasting Lipid Panel: No results found for this basename: CHOL,HDL,LDLCALC,TRIG,CHOLHDL,LDLDIRECT in the last 72 hours Thyroid Function Tests:  Basename 06/08/11 1900  TSH 0.438  T4TOTAL --  FREET4 0.78*  T3FREE --  THYROIDAB --   Anemia Panel:  Basename 06/08/11 1900  VITAMINB12 318  FOLATE --  FERRITIN 34  TIBC --  IRON --  RETICCTPCT --   Coagulation: No results found for this basename: LABPROT:2,INR:2 in the last 72  hours Urine Drug Screen: Drugs of Abuse     Component Value Date/Time   LABOPIA POSITIVE* 06/08/2011 1303   COCAINSCRNUR NONE DETECTED 06/08/2011 1303   LABBENZ NONE DETECTED 06/08/2011 1303   AMPHETMU NONE DETECTED 06/08/2011 1303   THCU NONE DETECTED 06/08/2011 1303   LABBARB NONE DETECTED 06/08/2011 1303    Alcohol Level:  Basename 06/08/11 1016  ETH 88*   Urinalysis:  Basename 06/08/11 1303  COLORURINE YELLOW  LABSPEC >1.030*  PHURINE 6.0  GLUCOSEU NEGATIVE  HGBUR TRACE*  BILIRUBINUR NEGATIVE  KETONESUR 15*  PROTEINUR TRACE*  UROBILINOGEN 0.2  NITRITE NEGATIVE  LEUKOCYTESUR SMALL*   Misc. Labs:   Micro: Recent Results (from the past 240 hour(s))  URINE CULTURE      Status: Normal (Preliminary result)   Collection Time   06/08/11  1:03 PM      Component Value Range Status Comment   Specimen Description URINE, CLEAN CATCH   Final    Special Requests NONE   Final    Culture  Setup Time [id number]   Final    Colony Count PENDING   Incomplete    Culture Culture reincubated for better growth   Final    Report Status PENDING   Incomplete     Studies/Results: Dg Abd Acute W/chest  06/08/2011  *RADIOLOGY REPORT*  Clinical Data: 78 year old male with pain.  ACUTE ABDOMEN SERIES (ABDOMEN 2 VIEW & CHEST 1 VIEW)  Comparison: 06/08/2011 and earlier.  Findings: Chronically large lung volumes re-identified on the upright view of the chest.  No pneumothorax or pneumoperitoneum. No pulmonary edema, definite effusion or acute pulmonary opacity. Stable cardiac size and mediastinal contours.  Small bowel gas does appear increased.  Bowel loops in the epigastric region measure up to 35 mm diameter, but I favor the largest of these loops are colon.  No definite small bowel dilatation.  There is gas and stool in the rectum.  Osteopenia.  Chronic changes at the left hip with hardware which demonstrates evidence of loosening.  Chronic compression fractures in the spine better characterized on comparisons.  IMPRESSION: 1. Nonobstructed bowel gas pattern, no free air. 2. No acute cardiopulmonary abnormality.  Original Report Authenticated By: Harley Hallmark, M.D.    Medications:  Scheduled:    . calcium carbonate  1 tablet Oral BID  . feeding supplement  237 mL Oral BID BM  . folic acid  1 mg Oral Daily  . insulin aspart  0-15 Units Subcutaneous TID WC  . insulin aspart  0-5 Units Subcutaneous QHS  . levalbuterol  0.63 mg Nebulization QID  . levofloxacin  250 mg Oral Daily  . LORazepam  0-4 mg Intravenous Q6H   Followed by  . LORazepam  0-4 mg Intravenous Q12H  . magnesium hydroxide  30 mL Oral BID  . multivitamin with minerals  1 tablet Oral Daily  . nicotine  14 mg  Transdermal Daily  . pantoprazole  40 mg Oral Daily  . predniSONE  20 mg Oral BID  . senna-docusate  1 tablet Oral BID  . sodium chloride  3 mL Intravenous Q12H  . thiamine  100 mg Oral Daily   Or  . thiamine  100 mg Intravenous Daily  . DISCONTD: levofloxacin (LEVAQUIN) IV  500 mg Intravenous Q24H  . DISCONTD: pantoprazole  40 mg Oral BID AC  . DISCONTD: pantoprazole (PROTONIX) IV  40 mg Intravenous Q24H   Continuous:    . 0.9 % NaCl with KCl 20 mEq / L 65  mL/hr at 06/10/11 0929  . DISCONTD: dextrose 5 % and 0.9 % NaCl with KCl 20 mEq/L 70 mL/hr at 06/09/11 1407   WUJ:[id number], acetaminophen, albuterol, alum & mag hydroxide-simeth, guaiFENesin-dextromethorphan, LORazepam, LORazepam, morphine injection, oxyCODONE  Assessment: Active Problems:  H N P-LUMBAR  SPINAL STENOSIS  LOW BACK PAIN  Hypoglycemia  Alcoholism /alcohol abuse  Compression fracture of lumbar vertebra  Hyponatremia  Alcohol intoxication  UTI (lower urinary tract infection)  Tobacco abuse  Underweight  Anorexia  Malnutrition of moderate degree  Metabolic acidosis  Elevated lipase  Leukopenia  Alcohol withdrawal syndrome   1. Acute on chronic low back pain. Noted compression fractures with no history of recent falls according to him. This is likely from malnutrition and probable osteopenia. He is on small dosing of prednisone and when necessary oxycodone.  Hypoglycemia and subsequently reported hyperglycemia of greater than 400 yesterday. The dextrose and IV fluids was discontinued. Sliding scale NovoLog started, but will be discontinued now that his capillary blood glucoses better. His hemoglobin A1c is 4.8.   Alcohol withdrawal syndrome with delirium/Alcoholism/alcohol abuse. He is clearly in alcohol withdrawal. He denied abusing alcohol and he drank only when his back hurt. The Ativan alcohol withdrawal protocol has been ordered. He is on vitamin therapy.  Tobacco abuse. Tobacco cessation  counseling ordered. Nicotine replacement therapy started. Urinary tract infection. Treating with  Levaquin.  Metabolic acidosis. Resolved with IV fluid hydration.  Malnutrition/underweight/generalized anorexia. This may be secondary to overall failure to thrive and alcohol use. Also consider chronic pain. His appetite is better. He wants to try to eat solid food. Nutrition/registered dietitian's recommendations noted and appreciated.  Elevated lipase. Probable nonclinical pancreatitis. His lipase level has normalized.   Constipation per abdominal x-ray yesterday. He is on laxative therapy now.  History of left hip replacement. The x-ray of his abdomen revealed hardware loosening, however, the patient has no significant pain. Because he is wheelchair bound and nonambulatory, we'll not consult orthopedic surgery.  Anemia. His ferritin level is low normal. His vitamin B12 level is within normal limits.  Leukopenia. May be in part dilutional as his white blood cell count was within normal limits on admission. HIV serology is negative.  Generalized failure to thrive/deconditioning. Per the physical therapist, he is not a skilled nursing facility candidate as he is chronically wheelchair-bound. The patient was interested in assisted living, however, when he learned of the cough, he was not willing to give up his monthly check and he was not willing to give up his trailer.   Plan:  1. Continue alcohol withdrawal protocol and supportive treatment. I have asked the nursing staff to not give the maximum doses of Ativan if possible given that the patient is nonambulatory and cachectic. This was discussed with Toni Amend. 2. We'll discontinue prednisone just in case this is contributing to his delirium.   LOS: 2 days   Jermaine [name] 06/10/2011, 2:51 PM

## 2011-06-11 DIAGNOSIS — F1010 Alcohol abuse, uncomplicated: Secondary | ICD-10-CM

## 2011-06-11 DIAGNOSIS — F10239 Alcohol dependence with withdrawal, unspecified: Secondary | ICD-10-CM

## 2011-06-11 DIAGNOSIS — D72819 Decreased white blood cell count, unspecified: Secondary | ICD-10-CM

## 2011-06-11 DIAGNOSIS — D649 Anemia, unspecified: Secondary | ICD-10-CM | POA: Diagnosis present

## 2011-06-11 DIAGNOSIS — E782 Mixed hyperlipidemia: Secondary | ICD-10-CM

## 2011-06-11 LAB — CBC
Hemoglobin: 10.9 g/dL — ABNORMAL LOW (ref 13.0–17.0)
MCH: 32.5 pg (ref 26.0–34.0)
Platelets: 205 10*3/uL (ref 150–400)
RBC: 3.35 MIL/uL — ABNORMAL LOW (ref 4.22–5.81)
WBC: 5.7 10*3/uL (ref 4.0–10.5)

## 2011-06-11 LAB — GLUCOSE, CAPILLARY: Glucose-Capillary: 74 mg/dL (ref 70–99)

## 2011-06-11 LAB — BASIC METABOLIC PANEL
CO2: 29 mEq/L (ref 19–32)
Chloride: 96 mEq/L (ref 96–112)
Glucose, Bld: 79 mg/dL (ref 70–99)
Potassium: 3.9 mEq/L (ref 3.5–5.1)
Sodium: 132 mEq/L — ABNORMAL LOW (ref 135–145)

## 2011-06-11 NOTE — Progress Notes (Signed)
Subjective: The patient is confused but able to carry on a small conversation. He denies pain. The nursing staff states that he did not sleep very well last night. He was apparently given Haldol and Ativan.  Objective: Vital signs in last 24 hours: Filed Vitals:   06/11/11 0442 06/11/11 0515 06/11/11 0727 06/11/11 1133  BP: 133/86     Pulse: 104 90    Temp: 97.8 F (36.6 C)     TempSrc: Oral     Resp: 20     Height:      Weight: 53.162 kg (117 lb 3.2 oz)     SpO2: 91%  92% 94%    Intake/Output Summary (Last 24 hours) at 06/11/11 1224 Last data filed at 06/10/11 2225  Gross per 24 hour  Intake    480 ml  Output      0 ml  Net    480 ml    Weight change:   Physical exam: General: 78 year old cachectic-appearing after American man, who is confused and intoxicated appearing. Lungs: Occasional rhonchus wheezes. Breathing nonlabored. Heart: S1, S2, with no murmurs rubs or gallops. Mild tachycardia. Abdomen: Positive bowel sounds, softer, less stool, less tender, no distention. Extremities: No pedal edema. Noted flexion contractures at the knees left greater than right. No tenderness to palpation of the left hip. Back: Mildly tender to palpation over the lumbar muscles. Neurologic: He is alert but appears intoxicated. He follows directions intermittently. He is confused. He is slurring of his speech.  Lab Results: Basic Metabolic Panel:  Basename 06/11/11 0457 06/10/11 0508  NA 132* 132*  K 3.9 4.4  CL 96 100  CO2 29 24  GLUCOSE 79 128*  BUN 5* 7  CREATININE 0.62 0.78  CALCIUM 9.5 8.8  MG -- --  PHOS -- --   Liver Function Tests:  [facility] 06/09/11 0553  AST 25  ALT 7  ALKPHOS 105  BILITOT 0.5  PROT 5.3*  ALBUMIN 2.7*    Basename 06/09/11 0553  LIPASE 59  AMYLASE --   No results found for this basename: AMMONIA:2 in the last 72 hours CBC:  Basename 06/11/11 0457 06/10/11 0508  WBC 5.7 2.9*  NEUTROABS -- --  HGB 10.9* 9.6*  HCT 31.1* 27.0*  MCV 92.8  93.1  PLT 205 190   Cardiac Enzymes: No results found for this basename: CKTOTAL:3,CKMB:3,CKMBINDEX:3,TROPONINI:3 in the last 72 hours BNP: No results found for this basename: PROBNP:3 in the last 72 hours D-Dimer: No results found for this basename: DDIMER:2 in the last 72 hours CBG:  Basename 06/11/11 0721 06/10/11 2046 06/10/11 1700 06/10/11 1208 06/10/11 0723 06/09/11 2116  GLUCAP 74 103* 99 125* 114* 200*   Hemoglobin A1C:  Basename 06/08/11 1900  HGBA1C 4.8   Fasting Lipid Panel: No results found for this basename: CHOL,HDL,LDLCALC,TRIG,CHOLHDL,LDLDIRECT in the last 72 hours Thyroid Function Tests:  Basename 06/08/11 1900  TSH 0.438  T4TOTAL --  FREET4 0.78*  T3FREE --  THYROIDAB --   Anemia Panel:  Basename 06/08/11 1900  VITAMINB12 318  FOLATE --  FERRITIN 34  TIBC --  IRON --  RETICCTPCT --   Coagulation: No results found for this basename: LABPROT:2,INR:2 in the last 72 hours Urine Drug Screen: Drugs of Abuse     Component Value Date/Time   LABOPIA POSITIVE* 06/08/2011 1303   COCAINSCRNUR NONE DETECTED 06/08/2011 1303   LABBENZ NONE DETECTED 06/08/2011 1303   AMPHETMU NONE DETECTED 06/08/2011 1303   THCU NONE DETECTED 06/08/2011 1303   LABBARB  NONE DETECTED 06/08/2011 1303    Alcohol Level: No results found for this basename: ETH:2 in the last 72 hours Urinalysis:  Basename 06/08/11 1303  COLORURINE YELLOW  LABSPEC >1.030*  PHURINE 6.0  GLUCOSEU NEGATIVE  HGBUR TRACE*  BILIRUBINUR NEGATIVE  KETONESUR 15*  PROTEINUR TRACE*  UROBILINOGEN 0.2  NITRITE NEGATIVE  LEUKOCYTESUR SMALL*   Misc. Labs:   Micro: Recent Results (from the past 240 hour(s))  URINE CULTURE     Status: Normal   Collection Time   06/08/11  1:03 PM      Component Value Range Status Comment   Specimen Description URINE, CLEAN CATCH   Final    Special Requests NONE   Final    Culture  Setup Time [id number]   Final    Colony Count 50,000 COLONIES/ML   Final    Culture      Final    Value: Multiple bacterial morphotypes present, none predominant. Suggest appropriate recollection if clinically indicated.   Report Status 06/10/2011 FINAL   Final     Studies/Results: No results found.  Medications:  Scheduled:    . calcium carbonate  1 tablet Oral BID  . feeding supplement  237 mL Oral BID BM  . folic acid  1 mg Oral Daily  . haloperidol lactate  2.5 mg Intravenous Once  . levalbuterol  0.63 mg Nebulization QID  . levofloxacin  250 mg Oral Daily  . LORazepam  0-4 mg Intravenous Q6H  . multivitamin with minerals  1 tablet Oral Daily  . nicotine  14 mg Transdermal Daily  . pantoprazole  40 mg Oral Daily  . senna-docusate  1 tablet Oral BID  . sodium chloride  3 mL Intravenous Q12H  . thiamine  100 mg Oral Daily   Or  . thiamine  100 mg Intravenous Daily  . DISCONTD: insulin aspart  0-15 Units Subcutaneous TID WC  . DISCONTD: insulin aspart  0-5 Units Subcutaneous QHS  . DISCONTD: LORazepam  0-4 mg Intravenous Q12H  . DISCONTD: predniSONE  20 mg Oral BID   Continuous:    . 0.9 % NaCl with KCl 20 mEq / L 65 mL/hr at 06/10/11 1308   MVH:[id number], acetaminophen, albuterol, alum & mag hydroxide-simeth, guaiFENesin-dextromethorphan, morphine injection, oxyCODONE, DISCONTD: LORazepam, DISCONTD: LORazepam  Assessment: Active Problems:  H N P-LUMBAR  SPINAL STENOSIS  LOW BACK PAIN  Hypoglycemia  Alcoholism /alcohol abuse  Compression fracture of lumbar vertebra  Hyponatremia  Alcohol intoxication  UTI (lower urinary tract infection)  Tobacco abuse  Underweight  Anorexia  Malnutrition of moderate degree  Metabolic acidosis  Elevated lipase  Leukopenia  Alcohol withdrawal syndrome      1. Acute on chronic low back pain. Noted compression fractures with no history of recent falls according to him. This is likely from malnutrition and probable osteopenia. Prednisone was discontinued to avoid steroid psychosis. He is on analgesics and  calcium supplementation.  Alcohol withdrawal syndrome with delirium/Alcoholism/alcohol abuse. The Ativan alcohol withdrawal protocol will be discontinued as I strongly believe that some if not all of his delirium and intoxicated behavior is likely the consequence of Ativan. RN Toni Amend stated that her nephew told her that a medication that he had been given in the past for alcohol withdrawal caused him to be more confused and delirious.  Hypoglycemia. Resolved. Likely secondary to malnutrition and poor intake. His hemoglobin A1c is 4.8.   Tobacco abuse. Tobacco cessation counseling ordered. Nicotine replacement therapy started. Urinary tract infection. Treating with  Levaquin.  Metabolic acidosis. Resolved with IV fluid hydration.  Malnutrition/underweight/generalized anorexia. This may be secondary to overall failure to thrive and alcohol use. Also consider chronic pain. His appetite is better. He wants to try to eat solid food. Nutrition/registered dietitian's recommendations noted and appreciated. The nursing staff says that he is eating small amounts. He drinks all of his insurer.  Elevated lipase. Probable nonclinical pancreatitis. His lipase level has normalized.   Constipation per abdominal x-ray yesterday. He is on laxative therapy now.  History of left hip replacement. The x-ray of his abdomen revealed hardware loosening, however, the patient has no significant pain. Because he is wheelchair bound and nonambulatory, we'll not consult orthopedic surgery.  Anemia. His ferritin level is low normal. His vitamin B12 level is within normal limits.  Leukopenia. May be in part dilutional as his white blood cell count was within normal limits on admission. HIV serology is negative.  Generalized failure to thrive/deconditioning. Per the physical therapist, he is not a skilled nursing facility candidate as he is chronically wheelchair-bound. The patient was interested in assisted living,  however, when he learned of the cough, he was not willing to give up his monthly check and he was not willing to give up his trailer.   Plan:  1. Discontinue Ativan. He has a Comptroller and this will be continued.   LOS: 3 days   Jermaine [name] 06/11/2011, 12:24 PM

## 2011-06-12 ENCOUNTER — Encounter (HOSPITAL_COMMUNITY): Payer: Self-pay | Admitting: Internal Medicine

## 2011-06-12 DIAGNOSIS — E440 Moderate protein-calorie malnutrition: Secondary | ICD-10-CM

## 2011-06-12 DIAGNOSIS — M545 Low back pain: Secondary | ICD-10-CM

## 2011-06-12 DIAGNOSIS — E782 Mixed hyperlipidemia: Secondary | ICD-10-CM

## 2011-06-12 DIAGNOSIS — F1010 Alcohol abuse, uncomplicated: Secondary | ICD-10-CM

## 2011-06-12 DIAGNOSIS — T50905A Adverse effect of unspecified drugs, medicaments and biological substances, initial encounter: Secondary | ICD-10-CM | POA: Diagnosis not present

## 2011-06-12 MED ORDER — OMEPRAZOLE 20 MG PO CPDR: 20.0000 mg | DELAYED_RELEASE_CAPSULE | Freq: Every day | ORAL | Status: DC

## 2011-06-12 MED ORDER — LEVALBUTEROL HCL 0.63 MG/3ML IN NEBU
0.6300 mg | INHALATION_SOLUTION | Freq: Three times a day (TID) | RESPIRATORY_TRACT | Status: DC
  Administered 2011-06-12: 0.63 mg via RESPIRATORY_TRACT
  Filled 2011-06-12: qty 3

## 2011-06-12 MED ORDER — ADULT MULTIVITAMIN W/MINERALS CH: 1.0000 | ORAL_TABLET | Freq: Every day | ORAL | Status: DC

## 2011-06-12 MED ORDER — ENSURE COMPLETE PO LIQD: 237.0000 mL | Freq: Two times a day (BID) | ORAL | Status: DC

## 2011-06-12 MED ORDER — OXYCODONE-ACETAMINOPHEN 5-325 MG PO TABS: 1.0000 | ORAL_TABLET | ORAL | Status: AC | PRN

## 2011-06-12 MED ORDER — CALCIUM-VITAMIN D 250-125 MG-UNIT PO TABS: 1.0000 | ORAL_TABLET | Freq: Every day | ORAL | Status: DC

## 2011-06-12 MED ORDER — THIAMINE HCL 100 MG PO TABS: 100.0000 mg | ORAL_TABLET | Freq: Every day | ORAL | Status: DC

## 2011-06-12 MED ORDER — LEVOFLOXACIN 250 MG PO TABS: 250.0000 mg | ORAL_TABLET | Freq: Every day | ORAL | Status: AC

## 2011-06-12 NOTE — Discharge Instructions (Signed)
Levofloxacin tablets What is this medicine? LEVOFLOXACIN (lee voe FLOX a sin) is a quinolone antibiotic. It is used to treat certain kinds of bacterial infections. It will not work for colds, flu, or other viral infections. This medicine may be used for other purposes; ask your health care provider or pharmacist if you have questions. What should I tell my health care provider before I take this medicine? They need to knowAcetaminophen; Oxycodone tablets What is this medicine? ACETAMINOPHEN; OXYCODONE (a set a MEE noe fen; ox i KOE done) is a pain reliever. It is used to treat mild to moderate pain. This medicine may be used for other purposes; ask your health care provider or pharmacist if you have questions. What should I tell my health care provider before I take this medicine? They need to know if you have any of these conditions: -brain tumor -Crohn's disease, inflammatory bowel disease, or ulcerative colitis -drink more than 3 alcohol containing drinks per day -drug abuse or addiction -head injury -heart or circulation problems -kidney disease or problems going to the bathroom -liver disease -lung disease, asthma, or breathing problems -an unusual or allergic reaction to acetaminophen, oxycodone, other opioid analgesics, other medicines, foods, dyes, or preservatives -pregnant or trying to get pregnant -breast-feeding How should I use this medicine? Take this medicine by mouth with a full glass of water. Follow the directions on the prescription label. Take your medicine at regular intervals. Do not take your medicine more often than directed. Talk to your pediatrician regarding the use of this medicine in children. Special care may be needed. Patients over 28 years old may have a stronger reaction and need a smaller dose. Overdosage: If you think you have taken too much of this medicine contact a poison control center or emergency room at once. NOTE: This medicine is only for you.  Do not share this medicine with others. What if I miss a dose? If you miss a dose, take it as soon as you can. If it is almost time for your next dose, take only that dose. Do not take double or extra doses. What may interact with this medicine? -alcohol or medicines that contain alcohol -antihistamines -barbiturates like amobarbital, butalbital, butabarbital, methohexital, pentobarbital, phenobarbital, thiopental, and secobarbital -benztropine -drugs for bladder problems like solifenacin, trospium, oxybutynin, tolterodine, hyoscyamine, and methscopolamine -drugs for breathing problems like ipratropium and tiotropium -drugs for certain stomach or intestine problems like propantheline, homatropine methylbromide, glycopyrrolate, atropine, belladonna, and dicyclomine -general anesthetics like etomidate, ketamine, nitrous oxide, propofol, desflurane, enflurane, halothane, isoflurane, and sevoflurane -medicines for depression, anxiety, or psychotic disturbances -medicines for pain like codeine, morphine, pentazocine, buprenorphine, butorphanol, nalbuphine, tramadol, and propoxyphene -medicines for sleep -muscle relaxants -naltrexone -phenothiazines like perphenazine, thioridazine, chlorpromazine, mesoridazine, fluphenazine, prochlorperazine, promazine, and trifluoperazine -scopolamine -trihexyphenidyl This list may not describe all possible interactions. Give your health care provider a list of all the medicines, herbs, non-prescription drugs, or dietary supplements you use. Also tell them if you smoke, drink alcohol, or use illegal drugs. Some items may interact with your medicine. What should I watch for while using this medicine? Tell your doctor or health care professional if your pain does not go away, if it gets worse, or if you have new or a different type of pain. You may develop tolerance to the medicine. Tolerance means that you will need a higher dose of the medication for pain relief.  Tolerance is normal and is expected if you take this medicine for a long time. Do not  suddenly stop taking your medicine because you may develop a severe reaction. Your body becomes used to the medicine. This does NOT mean you are addicted. Addiction is a behavior related to getting and using a drug for a nonmedical reason. If you have pain, you have a medical reason to take pain medicine. Your doctor will tell you how much medicine to take. If your doctor wants you to stop the medicine, the dose will be slowly lowered over time to avoid any side effects. You may get drowsy or dizzy. Do not drive, use machinery, or do anything that needs mental alertness until you know how this medicine affects you. Do not stand or sit up quickly, especially if you are an older patient. This reduces the risk of dizzy or fainting spells. Alcohol may interfere with the effect of this medicine. Avoid alcoholic drinks. The medicine will cause constipation. Try to have a bowel movement at least every 2 to 3 days. If you do not have a bowel movement for 3 days, call your doctor or health care professional. Do not take Tylenol (acetaminophen) or medicines that have acetaminophen with this medicine. Too much acetaminophen can be very dangerous. Many nonprescription medicines contain acetaminophen. Always read the labels carefully to avoid taking more acetaminophen. What side effects may I notice from receiving this medicine? Side effects that you should report to your doctor or health care professional as soon as possible: -allergic reactions like skin rash, itching or hives, swelling of the face, lips, or tongue -breathing difficulties, wheezing -confusion -light headedness or fainting spells -severe stomach pain -yellowing of the skin or the whites of the eyes Side effects that usually do not require medical attention (report to your doctor or health care professional if they continue or are  bothersome): -dizziness -drowsiness -nausea -vomiting This list may not describe all possible side effects. Call your doctor for medical advice about side effects. You may report side effects to FDA at 1-800-FDA-1088. Where should I keep my medicine? Keep out of the reach of children. This medicine can be abused. Keep your medicine in a safe place to protect it from theft. Do not share this medicine with anyone. Selling or giving away this medicine is dangerous and against the law. Store at room temperature between 20 and 25 degrees C (68 and 77 degrees F). Keep container tightly closed. Protect from light. Flush any unused medicines down the toilet. Do not use the medicine after the expiration date. NOTE: This sheet is a summary. It may not cover all possible information. If you have questions about this medicine, talk to your doctor, pharmacist, or health care provider.  2012, [employer]. (11/18/2007 10:01:21 AM) if you have any of these conditions: -cerebral disease -irregular heartbeat -kidney disease -seizure disorder -an unusual or allergic reaction to levofloxacin, other antibiotics or medicines, foods, dyes, or preservatives -pregnant or trying to get pregnant -breast-feeding How should I use this medicine? Take this medicine by mouth with a full glass of water. Follow the directions on the prescription label. This medicine can be taken with or without food. Take your medicine at regular intervals. Do not take your medicine more often than directed. Do not skip doses or stop your medicine early even if you feel better. Do not stop taking except on your doctor's advice. A special MedGuide will be given to you by the pharmacist with each prescription and refill. Be sure to read this information carefully each time. Talk to your pediatrician regarding the use  of this medicine in children. While this drug may be prescribed for children as young as 6 months for selected  conditions, precautions do apply. Overdosage: If you think you have taken too much of this medicine contact a poison control center or emergency room at once. NOTE: This medicine is only for you. Do not share this medicine with others. What if I miss a dose? If you miss a dose, take it as soon as you remember. If it is almost time for your next dose, take only that dose. Do not take double or extra doses. What may interact with this medicine? Do not take this medicine with any of the following medications: -arsenic trioxide -chloroquine -droperidol -medicines for irregular heart rhythm like amiodarone, disopyramide, dofetilide, flecainide, quinidine, procainamide, sotalol -some medicines for depression or mental problems like phenothiazines, pimozide, and ziprasidone This medicine may also interact with the following medications: -amoxapine -cisapride -dairy products -didanosine (ddI) buffered tablets or powder -haloperidol -multivitamins -retinoid products like tretinoin or isotretinoin -risperidone -some other antibiotics like clarithromycin or erythromycin -sucralfate -theophylline -warfarin This list may not describe all possible interactions. Give your health care provider a list of all the medicines, herbs, non-prescription drugs, or dietary supplements you use. Also tell them if you smoke, drink alcohol, or use illegal drugs. Some items may interact with your medicine. What should I watch for while using this medicine? Tell your doctor or health care professional if your symptoms do not improve or if they get worse. Drink several glasses of water a day and cut down on drinks that contain caffeine. You must not get dehydrated while taking this medicine. You may get drowsy or dizzy. Do not drive, use machinery, or do anything that needs mental alertness until you know how this medicine affects you. Do not sit or stand up quickly, especially if you are an older patient. This reduces  the risk of dizzy or fainting spells. This medicine can make you more sensitive to the sun. Keep out of the sun. If you cannot avoid being in the sun, wear protective clothing and use a sunscreen. Do not use sun lamps or tanning beds/booths. Contact your doctor if you get a sunburn. If you are a diabetic monitor your blood glucose carefully. If you get an unusual reading stop taking this medicine and call your doctor right away. Do not treat diarrhea with over-the-counter products. Contact your doctor if you have diarrhea that lasts more than 2 days or if the diarrhea is severe and watery. Avoid antacids, calcium, iron, and zinc products for 2 hours before and 2 hours after taking a dose of this medicine. What side effects may I notice from receiving this medicine? Side effects that you should report to your doctor or health care professional as soon as possible: -allergic reactions like skin rash or hives, swelling of the face, lips, or tongue -changes in vision -confusion, nightmares or hallucinations -difficulty breathing -irregular heartbeat, chest pain -joint, muscle or tendon pain -pain or difficulty passing urine -redness, blistering, peeling or loosening of the skin, including inside the mouth -seizures -unusual pain, numbness, tingling, or weakness -vaginal irritation, discharge Side effects that usually do not require medical attention (report to your doctor or health care professional if they continue or are bothersome): -diarrhea -dry mouth -headache -stomach upset, nausea -trouble sleeping This list may not describe all possible side effects. Call your doctor for medical advice about side effects. You may report side effects to FDA at 1-800-FDA-1088. Where should I keep  my medicine? Keep out of the reach of children. Store at room temperature between 15 and 30 degrees C (59 and 86 degrees F). Keep in a tightly closed container. Throw away any unused medicine after the  expiration date. NOTE: This sheet is a summary. It may not cover all possible information. If you have questions about this medicine, talk to your doctor, pharmacist, or health care provider.  2012, [employer]. (02/12/2009 3:43:04 PM)

## 2011-06-12 NOTE — Progress Notes (Signed)
Pt discharged with instructions, prescriptions, and care notes.  The patient verbalized understanding.  I also discussed prescriptions with his friend since he would be the one to take him to pick up his prescriptions.  Pt left the floor via his own w/c with staff in stable condition.  All questions were answered.

## 2011-06-12 NOTE — Discharge Summary (Signed)
Physician Discharge Summary  Jermaine [name] MRN: [id number] DOB/AGE: [birth year] 78 y.o.  PCP: Dr. [name], pending   Admit date: 06/08/2011 Discharge date: 06/12/2011  Discharge Diagnoses:  1. Acute on chronic low back pain. Patient has a history of age and ED-lumbar, spinal stenosis, and compression fractures of the lumbar vertebrae. 2. Hypoglycemia secondary to poor oral intake and alcoholism. 3. Alcohol abuse with alcohol withdrawal syndrome. 4. Worsening drug-induced delirium secondary to lorazepam. 5. Tobacco abuse. 6. Underweight, generalized anorexia and malnourished. Appetite improved significantly at the time of discharge. 7. Urinary tract infection. 8. Leukopenia. Resolved. HIV serology negative. 9. Elevated lipase without clinical evidence of pancreatitis. Resolved. 10. Metabolic acidosis, likely secondary to dehydration and alcoholism. Resolved. 11. Hyponatremia. 12. Chronic normocytic anemia. 13. Chronically debilitated and wheelchair-bound. 14. Loosening of left hip prosthetic hardware, treating conservatively as the patient is mostly wheelchair bound.   Medication List  As of 06/12/2011 12:18 PM   STOP taking these medications         oxycodone 5 MG capsule         TAKE these medications         calcium-vitamin D 250-125 MG-UNIT per tablet   Commonly known as: OSCAL   Take 1 tablet by mouth daily.      feeding supplement Liqd   Take 237 mLs by mouth 2 (two) times daily between meals.      levofloxacin 250 MG tablet   Commonly known as: LEVAQUIN   Take 1 tablet (250 mg total) by mouth daily. Antibiotic to be taken over the next 3 days.      multivitamin with minerals Tabs   Take 1 tablet by mouth daily.      omeprazole 20 MG capsule   Commonly known as: PRILOSEC   Take 1 capsule (20 mg total) by mouth daily. For stomach upset and to prevent stomach ulcers.      oxyCODONE-acetaminophen 5-325 MG per tablet   Commonly known as: PERCOCET   Take 1  tablet by mouth every 4 (four) hours as needed for pain.      thiamine 100 MG tablet   Take 1 tablet (100 mg total) by mouth daily. B.-Vitamin            Discharge Condition: Improved.  Disposition: 01-Home or Self Care   Consults: None.   Significant Diagnostic Studies: Dg Lumbar Spine Complete  06/08/2011  *RADIOLOGY REPORT*  Clinical Data: Diffuse back pain.  No known injury.  LUMBAR SPINE - COMPLETE 4+ VIEW  Comparison: 04/07/2011.  Findings: Five non-rib bearing lumbar vertebrae.  Interval approximately 20% L1 superior endplate compression deformity with mild bony retropulsion.  No significant change in previously demonstrated L2, L3, L4 and T10 vertebral compression deformities. No pars defects or subluxations.  Atheromatous arterial calcifications. Mild scoliosis.  Mild scoliosis.  Left hip fixation hardware.  IMPRESSION:  1. Interval approximately 20% L1 superior endplate compression fracture with mild bony retropulsion. 2.  Stable previously demonstrated T10, L2, L3 and L4 vertebral compression deformities.  Original Report Authenticated By: Darrol Angel, M.D.   Dg Abd Acute W/chest  06/08/2011  *RADIOLOGY REPORT*  Clinical Data: 78 year old male with pain.  ACUTE ABDOMEN SERIES (ABDOMEN 2 VIEW & CHEST 1 VIEW)  Comparison: 06/08/2011 and earlier.  Findings: Chronically large lung volumes re-identified on the upright view of the chest.  No pneumothorax or pneumoperitoneum. No pulmonary edema, definite effusion or acute pulmonary opacity. Stable cardiac size and mediastinal contours.  Small bowel  gas does appear increased.  Bowel loops in the epigastric region measure up to 35 mm diameter, but I favor the largest of these loops are colon.  No definite small bowel dilatation.  There is gas and stool in the rectum.  Osteopenia.  Chronic changes at the left hip with hardware which demonstrates evidence of loosening.  Chronic compression fractures in the spine better characterized on  comparisons.  IMPRESSION: 1. Nonobstructed bowel gas pattern, no free air. 2. No acute cardiopulmonary abnormality.  Original Report Authenticated By: Harley Hallmark, M.D.     Microbiology: Recent Results (from the past 240 hour(s))  URINE CULTURE     Status: Normal   Collection Time   06/08/11  1:03 PM      Component Value Range Status Comment   Specimen Description URINE, CLEAN CATCH   Final    Special Requests NONE   Final    Culture  Setup Time [id number]   Final    Colony Count 50,000 COLONIES/ML   Final    Culture     Final    Value: Multiple bacterial morphotypes present, none predominant. Suggest appropriate recollection if clinically indicated.   Report Status 06/10/2011 FINAL   Final      Labs: Results for orders placed during the hospital encounter of 06/08/11 (from the past 48 hour(s))  GLUCOSE, CAPILLARY     Status: Normal   Collection Time   06/10/11  5:00 PM      Component Value Range Comment   Glucose-Capillary 99  70 - 99 (mg/dL)   GLUCOSE, CAPILLARY     Status: Abnormal   Collection Time   06/10/11  8:46 PM      Component Value Range Comment   Glucose-Capillary 103 (*) 70 - 99 (mg/dL)    Comment 1 Notify RN      Comment 2 Documented in Chart     CBC     Status: Abnormal   Collection Time   06/11/11  4:57 AM      Component Value Range Comment   WBC 5.7  4.0 - 10.5 (K/uL)    RBC 3.35 (*) 4.22 - 5.81 (MIL/uL)    Hemoglobin 10.9 (*) 13.0 - 17.0 (g/dL)    HCT 81.1 (*) 91.4 - 52.0 (%)    MCV 92.8  78.0 - 100.0 (fL)    MCH 32.5  26.0 - 34.0 (pg)    MCHC 35.0  30.0 - 36.0 (g/dL)    RDW 78.2 (*) 95.6 - 15.5 (%)    Platelets 205  150 - 400 (K/uL)   BASIC METABOLIC PANEL     Status: Abnormal   Collection Time   06/11/11  4:57 AM      Component Value Range Comment   Sodium 132 (*) 135 - 145 (mEq/L)    Potassium 3.9  3.5 - 5.1 (mEq/L)    Chloride 96  96 - 112 (mEq/L)    CO2 29  19 - 32 (mEq/L)    Glucose, Bld 79  70 - 99 (mg/dL)    BUN 5 (*) 6 - 23 (mg/dL)     Creatinine, Ser 2.13  0.50 - 1.35 (mg/dL)    Calcium 9.5  8.4 - 10.5 (mg/dL)    GFR calc non Af Amer >90  >90 (mL/min)    GFR calc Af Amer >90  >90 (mL/min)   GLUCOSE, CAPILLARY     Status: Normal   Collection Time   06/11/11  7:21 AM  Component Value Range Comment   Glucose-Capillary 74  70 - 99 (mg/dL)    Comment 1 Notify RN     GLUCOSE, CAPILLARY     Status: Normal   Collection Time   06/11/11  7:59 PM      Component Value Range Comment   Glucose-Capillary 77  70 - 99 (mg/dL)   GLUCOSE, CAPILLARY     Status: Normal   Collection Time   06/12/11  3:00 AM      Component Value Range Comment   Glucose-Capillary 95  70 - 99 (mg/dL)      HPI : The patient is a 78 year old man with a history significant for alcohol abuse, left hip replacement, pulmonary embolism, and chronic back pain, who presented to the emergency department on 06/08/2011 with a chief complaint of low back pain. EMS was called by the patient for transportation to the emergency department. The EMT checked his blood glucose and it was 31. He was given D50 in route. When he arrived to the emergency department, his capillary blood glucose is 125. His other lab data were significant for a lipase of 82, albumin of 2.6, hemoglobin of 10.6, and blood alcohol level of 88. His urine drug screen was positive for opiates. His urinalysis revealed 11-20 WBCs. His lumbar x-ray revealed several thoracic and lumbar compression fractures. His acute abdominal series revealed no acute abnormality but it did reveal gas and stool in the rectum. He was admitted for further evaluation and management.  HOSPITAL COURSE: Initially, it was believed that the patient could call home from the emergency department pending assistance from the social worker for placement. Also, the ACT team was called to evaluate the patient for inpatient alcohol detoxification. However, the patient was subsequently admitted for hypoglycemia and generalized malnutrition/anorexia,  and alcohol abuse. He was started on normal saline with dextrose added. His capillary blood glucose was monitored closely during the first 24 hours. He was also started on the Ativan withdrawal protocol. Vitamin therapy with Foley catheter, multivitamin, and thiamine was started. Levaquin was started empirically for urinary tract infection. He was treated with as needed analgesics both IV and orally for back pain. He was started on laxative therapy for apparent constipation. Protonix was given intravenously for the first couple of days given that the patient probably has some element of alcoholic gastritis. His lipase was elevated, but clinically, he did not appear to have acute pancreatitis. Because of his overall malnutrition and general poor intake, the registered dietitian was consulted. She recommended nutritional supplement as tolerated and liberalizing his diet. Therefore, Ensure was ordered. His diet was advanced from full liquids to solid foods (dysphasia 3).  For further evaluation, a number of studies were ordered. His followup hemoglobin fell to 9.6 but stabilized at 10.9 prior to discharge. His vitamin B12 level was within normal limits at 318. His ferritin was within normal limits at 34. His white blood cell count fell from 6.4 to 2.7. This was thought to be, in part, secondary to hemodilution. Nevertheless, HIV serology was ordered and it was negative. His white blood cell count did normalize prior to discharge. His free T4 was slightly low, but his TSH was within normal limits. His hemoglobin A1c was 4.8. His urine culture grew out 50,000 colonies of multiple bacteria.  The patient developed alcohol withdrawal syndrome. Lorazepam was given appropriately. However, it was noted that the patient actually became more confused with each dose of lorazepam. Therefore, after approximately 36 hours on Ativan, it was  discontinued. Following the discontinue of Ativan, the patient became alert and oriented  and more appropriate.  The patient started eating much better. He had no complaints of back pain persistently during the hospitalization. He was and he is however chronically debilitated. He was interested in being placed in a local assisted living facility, however, when he was told that he would have to relinquish his monthly check, he refused. He is estranged from his son. However, he does have a nephew who looks in on him but he is currently hospitalized. He has a close neighbor, [place], who also checks on him daily. Per my conversation with Duffy Rhody, he is willing to check in on him daily and make sure that he is okay. I encouraged Duffy Rhody to assist the patient with sobriety and to not bring him alcohol if at all possible. Home health services were arranged by the case manager for the patient to receive evaluation from a Child psychotherapist and Designer, jewellery. Also, transportation services were acquired and arranged. The physical therapist evaluated the patient and felt that he was not a rehabilitation candidate as he was chronically wheelchair-bound. The patient was able to transfer with assistance.    Discharge Exam: Blood pressure 92/66, pulse 75, temperature 98.3 F (36.8 C), temperature source Oral, resp. rate 18, height 5\' 11"  (1.803 m), weight 50.032 kg (110 lb 4.8 oz), SpO2 94.00%.  Lungs: Occasional crackles, breathing nonlabored. Heart: S1, S2, with no murmurs rubs or gallops. Next line abdomen: Positive bowel sounds, soft, nontender, nondistended. Extremities: No pedal edema. Neurologic: He is much more alert and oriented. His speech is clear. His train of thought is clear.   Discharge Orders    Future Orders Please Complete By Expires   Diet - low sodium heart healthy      Increase activity slowly      Discharge instructions      Comments:   Do not drink alcohol. Particularly, do not drink alcohol with your pain medicine. Try to stop smoking. Followup with your new primary care  doctor as scheduled.      Follow-up Information    Follow up with [facility], MD on 06/19/2011. (appt at 10:30 am. [facility] to pick him up at 9:30. Dr [name]: [phone], [facility]: [phone])    Contact information:   [address] [place] [address] [phone]       Follow up with [facility]. (RN and SW, [phone])          Discharge time: 40 minutes.  Signed: Harvin Konicek 06/12/2011, 12:18 PM

## 2011-06-12 NOTE — Care Management Note (Signed)
    Page 1 of 1   06/09/2011     5:25:34 PM   CARE MANAGEMENT NOTE 06/09/2011  Patient:  Jermaine [name], Jermaine [name]   Account Number:  [id number]  Date Initiated:  06/08/2011  Documentation initiated by:  Anibal Henderson  Subjective/Objective Assessment:   Pt in ED with back pain, hypoglycemia, and ETOH abuse. he lives alone with friends who assist as needed. MD requested CM consult for [facility], but pt has no HH need. He uses a W/C at home due to back pain and difficulty walking, and is used to     Action/Plan:   this. No skilled RN need. He has no PCP to sign HH orders. He would like to go to [facility] for a month so they can " take care of me until  for a month or so, until my back gets better". Pt was unaware that he would have to pay copays for a   Anticipated DC Date:  06/08/2011   Anticipated DC Plan:  HOME/SELF CARE         Choice offered to / List presented to:             Status of service:  Completed, signed off Medicare Important Message given?   (If response is "NO", the following Medicare IM given date fields will be blank) Date Medicare IM given:   Date Additional Medicare IM given:    Discharge Disposition:    Per UR Regulation:    If discussed at Long Length of Stay Meetings, dates discussed:    Comments:  stay at a facility, and does not was to go anywhere that will take his check. Was willing to discuss ALF until he heard it would involve Medicaid and giving up his check every month. He does express interest in alcohol rehab, and MD will order ACT consult.  Mostly pt wants someone to care for him, cook clean and assist at home, and to help with his back pain.  MD aware of need for something for back problems.

## 2011-09-22 ENCOUNTER — Ambulatory Visit (INDEPENDENT_AMBULATORY_CARE_PROVIDER_SITE_OTHER): Payer: Medicare Other | Admitting: Urology

## 2011-09-22 DIAGNOSIS — R310 Gross hematuria: Secondary | ICD-10-CM

## 2011-09-22 DIAGNOSIS — C61 Malignant neoplasm of prostate: Secondary | ICD-10-CM

## 2011-09-26 ENCOUNTER — Other Ambulatory Visit: Payer: Self-pay | Admitting: Urology

## 2011-09-26 DIAGNOSIS — C61 Malignant neoplasm of prostate: Secondary | ICD-10-CM

## 2011-09-29 ENCOUNTER — Encounter (HOSPITAL_COMMUNITY)
Admission: RE | Admit: 2011-09-29 | Discharge: 2011-09-29 | Disposition: A | Discharge status: Home / Self Care | Payer: Medicare Other | Source: Ambulatory Visit | Attending: Urology | Admitting: Urology

## 2011-09-29 ENCOUNTER — Encounter (HOSPITAL_COMMUNITY): Payer: Self-pay

## 2011-09-29 DIAGNOSIS — C61 Malignant neoplasm of prostate: Secondary | ICD-10-CM | POA: Insufficient documentation

## 2011-09-29 DIAGNOSIS — R937 Abnormal findings on diagnostic imaging of other parts of musculoskeletal system: Secondary | ICD-10-CM | POA: Insufficient documentation

## 2011-09-29 MED ORDER — TECHNETIUM TC 99M MEDRONATE IV KIT
25.0000 | PACK | Freq: Once | INTRAVENOUS | Status: AC | PRN
  Administered 2011-09-29: 23 via INTRAVENOUS

## 2011-10-02 ENCOUNTER — Other Ambulatory Visit: Payer: Self-pay | Admitting: Urology

## 2011-10-12 ENCOUNTER — Encounter (HOSPITAL_COMMUNITY): Payer: Self-pay | Admitting: Pharmacy Technician

## 2011-10-13 ENCOUNTER — Other Ambulatory Visit: Payer: Self-pay | Admitting: Urology

## 2011-10-13 ENCOUNTER — Encounter (HOSPITAL_COMMUNITY): Payer: Self-pay

## 2011-10-13 ENCOUNTER — Encounter (HOSPITAL_COMMUNITY)
Admission: RE | Admit: 2011-10-13 | Discharge: 2011-10-13 | Disposition: A | Discharge status: Home / Self Care | Payer: Medicare Other | Source: Ambulatory Visit | Attending: Urology | Admitting: Urology

## 2011-10-13 LAB — URINALYSIS, ROUTINE W REFLEX MICROSCOPIC
Ketones, ur: NEGATIVE mg/dL
Nitrite: NEGATIVE
Protein, ur: NEGATIVE mg/dL
pH: 5 (ref 5.0–8.0)

## 2011-10-13 LAB — CBC
MCH: 31 pg (ref 26.0–34.0)
Platelets: 290 10*3/uL (ref 150–400)
RBC: 3.74 MIL/uL — ABNORMAL LOW (ref 4.22–5.81)

## 2011-10-13 LAB — SURGICAL PCR SCREEN
MRSA, PCR: NEGATIVE
Staphylococcus aureus: NEGATIVE

## 2011-10-13 NOTE — Patient Instructions (Signed)
20      Your procedure is scheduled on:  Tuesday 10/17/2011 at 1030 am  Report to [facility] at 0800  AM.  Call this number if you have problems the morning of surgery: [phone]   Remember:   Do not eat food or drink liquids after midnight!  Take these medicines the morning of surgery with A SIP OF WATER: Prilosec, use Albuterol inhakler if needed and bring with you to hospital   Do not bring valuables to the hospital.  .  Leave suitcase in the car. After surgery it may be brought to your room.  For patients admitted to the hospital, checkout time is 11:00 AM the day of              Discharge.    Special Instructions: See [facility] Preparing  For Surgery Instruction Sheet.  Do not wear jewelry, lotions powders, perfumes. Women do not shave legs or underarms for 12 hours before showers. Contacts, partial plates, or dentures may not be worn into surgery.                          Patients discharged the day of surgery will not be allowed to drive home.  If going home the same day of surgery, must have someone stay with you  first 24 hrs.at home and arrange for someone to drive you home from the  Hospital.                Please read over the following fact sheets that you were given: MRSA              INFORMATION, Sleep Apnea sheet, Incentive Spirometry Sheet               Telford Nab.Corgan Mormile,RN,BSN [phone]

## 2011-10-16 NOTE — H&P (Signed)
ctive Problems 1. Gross Hematuria 599.71 2. Microscopic Hematuria 599.72 3. Prostate Cancer 185 4. PSA,Elevated 790.93  History of Present Illness  Mr. Jermaine [name] is a 78Yo BM sent in consultation by Dr. [name] for gross hematuria that started about a week ago.  The urine has been faintly bloody.  He has had no significant dysuria with some urgency and frequency.  He is on cipro.  He has been having no difficulty voiding and the hematuria and symptoms have improved since starting the med.  He has no incontinence.  He has had no GU surgery or stones.   I reviewed records from [facility] and he had a CT in 5/12 that showed a lytic bony lesion that was not apparent on bone scan.  His PSA at that time was 152 which was up from 66 on an earlier draw.  He is wheelchair bound with lower extremity weakness.   He can walk with support.   Past Medical History 1. History of  Alcohol Abuse - In Remission 305.03 2. History of  Arthritis V13.4 3. History of  Atrial Fibrillation 427.31 4. History of  Venous Thrombosis Of The Deep Vessels Of The Lower Extremity 453.40  Surgical History 1. History of  Total Hip Replacement  Current Meds 1. Cipro 500 MG Oral Tablet; Therapy: (Recorded:20Sep2013) to 2. Omeprazole 20 MG Oral Capsule Delayed Release; Therapy: (Recorded:20Sep2013) to  Allergies 1. No Known Drug Allergies  Family History 1. Family history of  Death In The Family Father 2. Family history of  Death In The Family Mother  Social History   Caffeine Use 2 cups daily   Occupation: Retired   Tobacco Use 305.1 smokes pack a day Denied    History of  Alcohol Use  Review of Systems Genitourinary, constitutional, skin, eye, otolaryngeal, hematologic/lymphatic, cardiovascular, pulmonary, endocrine, musculoskeletal, gastrointestinal, neurological and psychiatric system(s) were reviewed and pertinent findings if present are noted.  Genitourinary: urinary frequency, feelings of urinary urgency, dysuria  and hematuria.  Gastrointestinal: no diarrhea and no constipation.  Constitutional: no fever and no recent weight loss.  Cardiovascular: no leg swelling.  Musculoskeletal: back pain and joint pain (knees).  Neurological: limb weakness.    Vitals Vital Signs [Data Includes: Last 1 Day]  20Sep2013 02:45PM  Blood Pressure: 120 / 77 Temperature: 95.4 F, Axillary Heart Rate: 82  Physical Exam Constitutional: well developed . No acute distress. Cachectic.  ENT:. The ears and nose are normal in appearance.  Neck: The appearance of the neck is normal and no neck mass is present.  Pulmonary: No respiratory distress and normal respiratory rhythm and effort.  Cardiovascular: Heart rate and rhythm are normal . No peripheral edema.  Abdomen: The abdomen is soft and nontender. No masses are palpated. No CVA tenderness. No hernias are palpable. No hepatosplenomegaly noted.  Rectal: Rectal exam demonstrates normal sphincter tone, no tenderness and no masses. Estimated prostate size is 3+. The prostate has no nodularity, is indurated (diffuse and irregular consistent with T3/T4 prostate cancer. ) and is not tender. The left seminal vesicle is nonpalpable. The right seminal vesicle is nonpalpable. The perineum is normal on inspection.  Genitourinary: Examination of the penis demonstrates no discharge, no masses, no lesions and a normal meatus. The scrotum is without lesions. The right epididymis is palpably normal and non-tender. The left epididymis is palpably normal and non-tender. The right testis is non-tender and without masses. The left testis is non-tender and without masses.  Lymphatics: The supraclavicular, axillary, femoral and inguinal nodes are  not enlarged or tender.  Skin: Normal skin turgor and no visible rash . He has extensive bruising and thin skin on his arms.  Neuro/Psych:. Mood and affect are appropriate.    Results/Data  Old records or history reviewed: I have reviewed records from  Dr. [name]. A urine was not available.  The following clinical lab reports were reviewed:  I have reviewed his lab results from [facility] and his PSA was 152 on 05/08/10.  I have reviewed his CT scan from 5/12.  Will request old records/history: I have reviewed an office/hospital note from Dr. Mariel Sleet from 5/12.    Assessment 1. Gross Hematuria 599.71 2. Prostate Cancer 185 3. PSA,Elevated 790.93   He has gross hematuria that may have been secondary to a UTI, but he appears to have at least locally advanced prostate cancer that has been untreated.   Plan Microscopic Hematuria (599.72)  1. UA With REFLEX  Requested for: 20Sep2013 Prostate Cancer (185)  2. CBC W/DIFF  Requested for: 20Sep2013 3. COMPREHENSIVE METABOLIC PANEL  Requested for: 20Sep2013 4. PSA  Requested for: 20Sep2013 5. TESTOSTERONE  Requested for: 20Sep2013 6. Follow-up Schedule Surgery Office  Follow-up  Requested for: 20Sep2013   He was unable to get a UA today, but we will continue to try and get one. I am going to get a PSA, testosterone, CBC and CMP today. I will consider additional imaging once the PSA is back.  He is going to need androgen ablation and I think he will be best served by a combination procedure with a prostate biopsy, cystoscopy and bilateral orchiectomy.  I have reviewed the risks in detail.   I am concerned about his potential compliance and that is why I think orchiectomy is most appropriate.   Discussion/Summary  CC: Dr. Latanya Presser and Dr. Glenford Peers.

## 2011-10-17 ENCOUNTER — Encounter (HOSPITAL_COMMUNITY): Payer: Self-pay | Admitting: Anesthesiology

## 2011-10-17 ENCOUNTER — Encounter (HOSPITAL_COMMUNITY)
Admission: RE | Disposition: A | Discharge status: Home / Self Care | Payer: Self-pay | Source: Ambulatory Visit | Attending: Urology

## 2011-10-17 ENCOUNTER — Observation Stay (HOSPITAL_COMMUNITY)
Admission: RE | Admit: 2011-10-17 | Discharge: 2011-10-18 | Disposition: A | Discharge status: Home / Self Care | Payer: Medicare Other | Source: Ambulatory Visit | Attending: Urology | Admitting: Urology

## 2011-10-17 ENCOUNTER — Encounter (HOSPITAL_COMMUNITY): Payer: Self-pay

## 2011-10-17 ENCOUNTER — Ambulatory Visit (HOSPITAL_COMMUNITY): Payer: Medicare Other | Admitting: Anesthesiology

## 2011-10-17 DIAGNOSIS — Z79899 Other long term (current) drug therapy: Secondary | ICD-10-CM | POA: Insufficient documentation

## 2011-10-17 DIAGNOSIS — Z4009 Encounter for prophylactic removal of other organ: Secondary | ICD-10-CM | POA: Insufficient documentation

## 2011-10-17 DIAGNOSIS — C61 Malignant neoplasm of prostate: Principal | ICD-10-CM | POA: Insufficient documentation

## 2011-10-17 DIAGNOSIS — F17200 Nicotine dependence, unspecified, uncomplicated: Secondary | ICD-10-CM | POA: Insufficient documentation

## 2011-10-17 DIAGNOSIS — Z86718 Personal history of other venous thrombosis and embolism: Secondary | ICD-10-CM | POA: Insufficient documentation

## 2011-10-17 DIAGNOSIS — N138 Other obstructive and reflux uropathy: Secondary | ICD-10-CM | POA: Insufficient documentation

## 2011-10-17 DIAGNOSIS — N401 Enlarged prostate with lower urinary tract symptoms: Secondary | ICD-10-CM | POA: Insufficient documentation

## 2011-10-17 DIAGNOSIS — N390 Urinary tract infection, site not specified: Secondary | ICD-10-CM | POA: Insufficient documentation

## 2011-10-17 DIAGNOSIS — I4891 Unspecified atrial fibrillation: Secondary | ICD-10-CM | POA: Insufficient documentation

## 2011-10-17 DIAGNOSIS — R319 Hematuria, unspecified: Secondary | ICD-10-CM | POA: Insufficient documentation

## 2011-10-17 DIAGNOSIS — Z01812 Encounter for preprocedural laboratory examination: Secondary | ICD-10-CM | POA: Insufficient documentation

## 2011-10-17 HISTORY — PX: ORCHIECTOMY: SHX2116

## 2011-10-17 HISTORY — PX: CYSTOSCOPY: SHX5120

## 2011-10-17 HISTORY — PX: PROSTATE BIOPSY: SHX241

## 2011-10-17 LAB — URINE CULTURE: Colony Count: >=100000

## 2011-10-17 SURGERY — CYSTOSCOPY
Anesthesia: General | Wound class: Clean Contaminated

## 2011-10-17 MED ORDER — ONDANSETRON HCL 4 MG/2ML IJ SOLN
INTRAMUSCULAR | Status: DC | PRN
  Administered 2011-10-17: 4 mg via INTRAVENOUS

## 2011-10-17 MED ORDER — ROCURONIUM BROMIDE 100 MG/10ML IV SOLN
INTRAVENOUS | Status: DC | PRN
  Administered 2011-10-17: 15 mg via INTRAVENOUS
  Administered 2011-10-17: 5 mg via INTRAVENOUS

## 2011-10-17 MED ORDER — EPHEDRINE SULFATE 50 MG/ML IJ SOLN
INTRAMUSCULAR | Status: DC | PRN
  Administered 2011-10-17: 10 mg via INTRAVENOUS
  Administered 2011-10-17: 5 mg via INTRAVENOUS

## 2011-10-17 MED ORDER — ACETAMINOPHEN 325 MG PO TABS: 650.0000 mg | ORAL_TABLET | ORAL | Status: DC | PRN

## 2011-10-17 MED ORDER — MEPERIDINE HCL 50 MG/ML IJ SOLN: 6.2500 mg | INTRAMUSCULAR | Status: DC | PRN

## 2011-10-17 MED ORDER — LACTATED RINGERS IV SOLN: INTRAVENOUS | Status: DC

## 2011-10-17 MED ORDER — HYDROMORPHONE HCL PF 1 MG/ML IJ SOLN: 0.5000 mg | INTRAMUSCULAR | Status: DC | PRN

## 2011-10-17 MED ORDER — SUCCINYLCHOLINE CHLORIDE 20 MG/ML IJ SOLN
INTRAMUSCULAR | Status: DC | PRN
  Administered 2011-10-17: 100 mg via INTRAVENOUS

## 2011-10-17 MED ORDER — INFLUENZA VIRUS VACC SPLIT PF IM SUSP
0.5000 mL | INTRAMUSCULAR | Status: DC
  Filled 2011-10-17: qty 0.5

## 2011-10-17 MED ORDER — STERILE WATER FOR IRRIGATION IR SOLN
Status: DC | PRN
  Administered 2011-10-17: 3000 mL via INTRAVESICAL

## 2011-10-17 MED ORDER — HYDROCODONE-ACETAMINOPHEN 5-325 MG PO TABS: 1.0000 | ORAL_TABLET | ORAL | Status: DC | PRN

## 2011-10-17 MED ORDER — DOCUSATE SODIUM 100 MG PO CAPS
100.0000 mg | ORAL_CAPSULE | Freq: Two times a day (BID) | ORAL | Status: DC
  Administered 2011-10-17 – 2011-10-18 (×2): 100 mg via ORAL
  Filled 2011-10-17 (×3): qty 1

## 2011-10-17 MED ORDER — FENTANYL CITRATE 0.05 MG/ML IJ SOLN
INTRAMUSCULAR | Status: DC | PRN
  Administered 2011-10-17 (×3): 50 ug via INTRAVENOUS
  Administered 2011-10-17: 25 ug via INTRAVENOUS

## 2011-10-17 MED ORDER — LIDOCAINE HCL 2 % EX GEL
CUTANEOUS | Status: AC
  Filled 2011-10-17: qty 10

## 2011-10-17 MED ORDER — BACLOFEN 10 MG PO TABS
10.0000 mg | ORAL_TABLET | Freq: Two times a day (BID) | ORAL | Status: DC
  Administered 2011-10-17 – 2011-10-18 (×2): 10 mg via ORAL
  Filled 2011-10-17 (×3): qty 1

## 2011-10-17 MED ORDER — CIPROFLOXACIN IN D5W 400 MG/200ML IV SOLN
INTRAVENOUS | Status: AC
  Filled 2011-10-17: qty 200

## 2011-10-17 MED ORDER — PROMETHAZINE HCL 25 MG/ML IJ SOLN: 6.2500 mg | INTRAMUSCULAR | Status: DC | PRN

## 2011-10-17 MED ORDER — ALBUTEROL SULFATE (5 MG/ML) 0.5% IN NEBU: 2.5000 mg | INHALATION_SOLUTION | Freq: Four times a day (QID) | RESPIRATORY_TRACT | Status: DC | PRN

## 2011-10-17 MED ORDER — ACETAMINOPHEN 10 MG/ML IV SOLN
INTRAVENOUS | Status: AC
  Filled 2011-10-17: qty 100

## 2011-10-17 MED ORDER — OXYCODONE HCL 5 MG/5ML PO SOLN
5.0000 mg | Freq: Once | ORAL | Status: DC | PRN
  Filled 2011-10-17: qty 5

## 2011-10-17 MED ORDER — HYDROMORPHONE HCL PF 1 MG/ML IJ SOLN: 0.2500 mg | INTRAMUSCULAR | Status: DC | PRN

## 2011-10-17 MED ORDER — GLYCOPYRROLATE 0.2 MG/ML IJ SOLN
INTRAMUSCULAR | Status: DC | PRN
  Administered 2011-10-17: 0.3 mg via INTRAVENOUS

## 2011-10-17 MED ORDER — NEOSTIGMINE METHYLSULFATE 1 MG/ML IJ SOLN
INTRAMUSCULAR | Status: DC | PRN
  Administered 2011-10-17: 3 mg via INTRAVENOUS

## 2011-10-17 MED ORDER — LACTATED RINGERS IV SOLN
INTRAVENOUS | Status: DC | PRN
  Administered 2011-10-17 (×2): via INTRAVENOUS

## 2011-10-17 MED ORDER — CIPROFLOXACIN IN D5W 200 MG/100ML IV SOLN
200.0000 mg | Freq: Two times a day (BID) | INTRAVENOUS | Status: DC
  Administered 2011-10-17 – 2011-10-18 (×2): 200 mg via INTRAVENOUS
  Filled 2011-10-17 (×3): qty 100

## 2011-10-17 MED ORDER — OXYCODONE HCL 5 MG PO TABS: 5.0000 mg | ORAL_TABLET | Freq: Once | ORAL | Status: DC | PRN

## 2011-10-17 MED ORDER — PROPOFOL 10 MG/ML IV BOLUS
INTRAVENOUS | Status: DC | PRN
  Administered 2011-10-17: 110 mg via INTRAVENOUS

## 2011-10-17 MED ORDER — DEXTROSE 5 % IV SOLN
2.0000 g | INTRAVENOUS | Status: AC
  Administered 2011-10-17: 2 g via INTRAVENOUS
  Filled 2011-10-17: qty 2

## 2011-10-17 MED ORDER — BISACODYL 10 MG RE SUPP: 10.0000 mg | Freq: Every day | RECTAL | Status: DC | PRN

## 2011-10-17 MED ORDER — SODIUM CHLORIDE 0.9 % IV SOLN
1.0000 g | Freq: Four times a day (QID) | INTRAVENOUS | Status: DC
  Administered 2011-10-17 – 2011-10-18 (×4): 1 g via INTRAVENOUS
  Filled 2011-10-17 (×10): qty 1000

## 2011-10-17 MED ORDER — LIDOCAINE HCL (CARDIAC) 20 MG/ML IV SOLN
INTRAVENOUS | Status: DC | PRN
  Administered 2011-10-17: 50 mg via INTRAVENOUS

## 2011-10-17 MED ORDER — ZOLPIDEM TARTRATE 5 MG PO TABS: 5.0000 mg | ORAL_TABLET | Freq: Every evening | ORAL | Status: DC | PRN

## 2011-10-17 MED ORDER — PANTOPRAZOLE SODIUM 40 MG PO TBEC
40.0000 mg | DELAYED_RELEASE_TABLET | Freq: Every day | ORAL | Status: DC
  Administered 2011-10-17 – 2011-10-18 (×2): 40 mg via ORAL
  Filled 2011-10-17 (×3): qty 1

## 2011-10-17 MED ORDER — KCL IN DEXTROSE-NACL 20-5-0.45 MEQ/L-%-% IV SOLN
INTRAVENOUS | Status: DC
  Administered 2011-10-17: 75 mL/h via INTRAVENOUS
  Administered 2011-10-18: 05:00:00 via INTRAVENOUS
  Filled 2011-10-17 (×3): qty 1000

## 2011-10-17 MED ORDER — CIPROFLOXACIN IN D5W 400 MG/200ML IV SOLN
400.0000 mg | INTRAVENOUS | Status: AC
  Administered 2011-10-17: 400 mg via INTRAVENOUS

## 2011-10-17 MED ORDER — PNEUMOCOCCAL VAC POLYVALENT 25 MCG/0.5ML IJ INJ
0.5000 mL | INJECTION | INTRAMUSCULAR | Status: DC
  Filled 2011-10-17: qty 0.5

## 2011-10-17 SURGICAL SUPPLY — 36 items
APL SKNCLS STERI-STRIP NONHPOA (GAUZE/BANDAGES/DRESSINGS)
BAG URO CATCHER STRL LF (DRAPE) IMPLANT
BANDAGE GAUZE ELAST BULKY 4 IN (GAUZE/BANDAGES/DRESSINGS) ×3 IMPLANT
BENZOIN TINCTURE PRP APPL 2/3 (GAUZE/BANDAGES/DRESSINGS) IMPLANT
BLADE HEX COATED 2.75 (ELECTRODE) ×3 IMPLANT
BLADE SURG 15 STRL LF DISP TIS (BLADE) ×2 IMPLANT
BLADE SURG 15 STRL SS (BLADE) ×3
CATH ROBINSON RED A/P 16FR (CATHETERS) IMPLANT
CLOTH BEACON ORANGE TIMEOUT ST (SAFETY) ×3 IMPLANT
COVER SURGICAL LIGHT HANDLE (MISCELLANEOUS) ×3 IMPLANT
DRAIN PENROSE 18X1/2 LTX STRL (DRAIN) ×3 IMPLANT
DRAIN PENROSE 18X1/4 LTX STRL (WOUND CARE) ×3 IMPLANT
DRAPE CAMERA CLOSED 9X96 (DRAPES) IMPLANT
ELECT REM PT RETURN 9FT ADLT (ELECTROSURGICAL) ×3
ELECTRODE REM PT RTRN 9FT ADLT (ELECTROSURGICAL) ×2 IMPLANT
GLOVE SURG SS PI 8.0 STRL IVOR (GLOVE) ×3 IMPLANT
GOWN PREVENTION PLUS XLARGE (GOWN DISPOSABLE) ×3 IMPLANT
GOWN STRL REIN XL XLG (GOWN DISPOSABLE) ×3 IMPLANT
KIT BASIN OR (CUSTOM PROCEDURE TRAY) ×3 IMPLANT
MANIFOLD NEPTUNE II (INSTRUMENTS) IMPLANT
NEEDLE HYPO 22GX1.5 SAFETY (NEEDLE) IMPLANT
NS IRRIG 1000ML POUR BTL (IV SOLUTION) ×3 IMPLANT
PACK BASIC VI WITH GOWN DISP (CUSTOM PROCEDURE TRAY) ×3 IMPLANT
PACK CYSTO (CUSTOM PROCEDURE TRAY) IMPLANT
PENCIL BUTTON HOLSTER BLD 10FT (ELECTRODE) ×3 IMPLANT
SPONGE GAUZE 4X4 12PLY (GAUZE/BANDAGES/DRESSINGS) ×3 IMPLANT
SPONGE LAP 4X18 X RAY DECT (DISPOSABLE) ×6 IMPLANT
SUPPORT SCROTAL LG STRP (MISCELLANEOUS) ×3 IMPLANT
SUT CHROMIC 3 0 SH 27 (SUTURE) ×6 IMPLANT
SUT CHROMIC 4 0 SH 27 (SUTURE) IMPLANT
SUT VIC AB 3-0 SH 27 (SUTURE) ×3
SUT VIC AB 3-0 SH 27XBRD (SUTURE) ×2 IMPLANT
SUT VICRYL 0 TIES 12 18 (SUTURE) ×3 IMPLANT
SYR CONTROL 10ML LL (SYRINGE) IMPLANT
TUBING CONNECTING 10 (TUBING) IMPLANT
WATER STERILE IRR 1500ML POUR (IV SOLUTION) IMPLANT

## 2011-10-17 NOTE — Transfer of Care (Signed)
Immediate Anesthesia Transfer of Care Note  Patient: Jermaine [name]  Procedure(s) Performed: Procedure(s) (LRB) with comments: CYSTOSCOPY (N/A) - Cystoscopy/Orchiectomy/Transrectal Ultrasound Guided Prostate Biopsy ORCHIECTOMY (Bilateral) BIOPSY TRANSRECTAL ULTRASONIC PROSTATE (TUBP) (N/A)  Patient Location: PACU  Anesthesia Type: General  Level of Consciousness: awake, alert  and patient cooperative  Airway & Oxygen Therapy: Patient Spontanous Breathing and Patient connected to face mask oxygen  Post-op Assessment: Report given to PACU RN, Post -op Vital signs reviewed and stable and Patient moving all extremities  Post vital signs: Reviewed and stable  Complications: No apparent anesthesia complications

## 2011-10-17 NOTE — Progress Notes (Signed)
pacu note: requested ampicillin at 1204 , checked tube at 1215, 1230, 1245, 1300, no ampicllin, spoke with pharmacy several times concerning this issue, transferred to room 1407, reported to RN that Dr. Wilson Singer wants pt. to get ampicillin ASAP

## 2011-10-17 NOTE — Interval H&P Note (Signed)
History and Physical Interval Note:  10/17/2011 9:52 AM  Jermaine [name]  has presented today for surgery, with the diagnosis of Hematuria, Prostate Cancer  The various methods of treatment have been discussed with the patient and family. After consideration of risks, benefits and other options for treatment, the patient has consented to  Procedure(s) (LRB) with comments: CYSTOSCOPY (N/A) - Cystoscopy/Orchiectomy/Transrectal Ultrasound Guided Prostate Biopsy ORCHIECTOMY (N/A) BIOPSY TRANSRECTAL ULTRASONIC PROSTATE (TUBP) (N/A) as a surgical intervention .  The patient's history has been reviewed, patient examined, no change in status, stable for surgery.  I have reviewed the patient's chart and labs.  Questions were answered to the patient's satisfaction.     Laverle Pillard J

## 2011-10-17 NOTE — Anesthesia Preprocedure Evaluation (Addendum)
Anesthesia Evaluation  Patient identified by MRN, date of birth, ID band Patient awake    Reviewed: Allergy & Precautions, H&P , NPO status , Patient's Chart, lab work & pertinent test results  Airway Mallampati: II TM Distance: >3 FB Neck ROM: Full    Dental  (+) Dental Advisory Given, Edentulous Upper, Poor Dentition, Loose and Missing   Pulmonary pneumonia -, resolved, COPD COPD inhaler, Current Smoker, PE breath sounds clear to auscultation  Pulmonary exam normal       Cardiovascular DVT - Past MI + Valvular Problems/Murmurs AI Rhythm:Regular Rate:Normal  Mild to moderate aortic regurgitation on echo from 2010   Neuro/Psych PSYCHIATRIC DISORDERS  Neuromuscular disease    GI/Hepatic negative GI ROS, Neg liver ROS, (+)     substance abuse  alcohol use,   Endo/Other  negative endocrine ROS  Renal/GU negative Renal ROS     Musculoskeletal   Abdominal   Peds  Hematology negative hematology ROS (+)   Anesthesia Other Findings   Reproductive/Obstetrics                         Anesthesia Physical Anesthesia Plan  ASA: IV  Anesthesia Plan: General   Post-op Pain Management:    Induction: Intravenous  Airway Management Planned: Oral ETT  Additional Equipment:   Intra-op Plan:   Post-operative Plan: Extubation in OR  Informed Consent: I have reviewed the patients History and Physical, chart, labs and discussed the procedure including the risks, benefits and alternatives for the proposed anesthesia with the patient or authorized representative who has indicated his/her understanding and acceptance.   Dental advisory given  Plan Discussed with: CRNA  Anesthesia Plan Comments:        Anesthesia Quick Evaluation

## 2011-10-17 NOTE — Care Management Note (Signed)
    Page 1 of 1   10/18/2011     10:47:02 AM   CARE MANAGEMENT NOTE 10/18/2011  Patient:  Jermaine [name], Jermaine [name]   Account Number:  [id number]  Date Initiated:  10/17/2011  Documentation initiated by:  Lanier Clam  Subjective/Objective Assessment:   ADMITTED W/HEMATURIA.     Action/Plan:   FROM HOME   Anticipated DC Date:  10/18/2011   Anticipated DC Plan:  HOME/SELF CARE      DC Planning Services  CM consult      Choice offered to / List presented to:             Status of service:  Completed, signed off Medicare Important Message given?   (If response is "NO", the following Medicare IM given date fields will be blank) Date Medicare IM given:   Date Additional Medicare IM given:    Discharge Disposition:  HOME/SELF CARE  Per UR Regulation:  Reviewed for med. necessity/level of care/duration of stay  If discussed at Long Length of Stay Meetings, dates discussed:    Comments:  10/17/11 Jessyca Sloan RN,BSN NCM [phone] S/P TUBP,CYSTOSCOPY

## 2011-10-17 NOTE — Anesthesia Postprocedure Evaluation (Signed)
Anesthesia Post Note  Patient: Jermaine [name]  Procedure(s) Performed: Procedure(s) (LRB): CYSTOSCOPY (N/A) ORCHIECTOMY (Bilateral) BIOPSY TRANSRECTAL ULTRASONIC PROSTATE (TUBP) (N/A)  Anesthesia type: General  Patient location: PACU  Post pain: Pain level controlled  Post assessment: Post-op Vital signs reviewed  Last Vitals: BP 120/64  Pulse 57  Temp 36.6 C (Oral)  Resp 16  SpO2 100%  Post vital signs: Reviewed  Level of consciousness: sedated  Complications: No apparent anesthesia complications

## 2011-10-17 NOTE — Progress Notes (Signed)
Patient removed packing, moderate amount of blood was on the bedpad. Bed plan was replaced and RN instructed patient to keep the dressing in the sling to help with the bleeding. During assessment, no blood was noted on the bedpad. RN will continue to remind patient and monitor closely. RN reminded patient to use incentive spirometer while he is awake to help prevent pneumonia, patient stated he understood.

## 2011-10-17 NOTE — Brief Op Note (Signed)
10/17/2011  11:44 AM  PATIENT:  Jermaine [name]  78 y.o. male  PRE-OPERATIVE DIAGNOSIS:  Hematuria, Prostate Cancer  POST-OPERATIVE DIAGNOSIS:  Hematuria, Prostate Cancer  PROCEDURE:  Procedure(s) (LRB) with comments: CYSTOSCOPY (N/A) - Cystoscopy/Orchiectomy/Transrectal Ultrasound Guided Prostate Biopsy ORCHIECTOMY (Bilateral) BIOPSY TRANSRECTAL ULTRASONIC PROSTATE (TUBP) (N/A)  SURGEON:  Surgeon(s) and Role:    * Anner Crete, MD - Primary  PHYSICIAN ASSISTANT:   ASSISTANTS: none   ANESTHESIA:   general  EBL:  Total I/O In: 1000 [I.V.:1000] Out: 10 [Blood:10]  BLOOD ADMINISTERED:none  DRAINS: none   LOCAL MEDICATIONS USED:  NONE  SPECIMEN:  Source of Specimen:  biopsies from right SV and prostate and both testes.   DISPOSITION OF SPECIMEN:  PATHOLOGY  COUNTS:  YES  TOURNIQUET:  * No tourniquets in log *  DICTATION: .Other Dictation: Dictation Number [id number]  PLAN OF CARE: Admit for overnight observation  PATIENT DISPOSITION:  PACU - hemodynamically stable.   Delay start of Pharmacological VTE agent (>24hrs) due to surgical blood loss or risk of bleeding: not applicable

## 2011-10-18 ENCOUNTER — Encounter (HOSPITAL_COMMUNITY): Payer: Self-pay | Admitting: Urology

## 2011-10-18 MED ORDER — AMPICILLIN 500 MG PO CAPS: 500.0000 mg | ORAL_CAPSULE | Freq: Four times a day (QID) | ORAL | Status: DC

## 2011-10-18 MED ORDER — HYDROCODONE-ACETAMINOPHEN 5-325 MG PO TABS: 1.0000 | ORAL_TABLET | Freq: Four times a day (QID) | ORAL | Status: DC | PRN

## 2011-10-18 NOTE — Progress Notes (Signed)
Pt discharged home via family; Pt and family given and explained all discharge instructions, carenotes, and prescriptions; pt and family stated understanding and denied questions/concerns; all f/u appointments in place; IV removed without complicaitons; pt stable at time of discharge  

## 2011-10-18 NOTE — Op Note (Signed)
NAMEBRITON, [name]  MEDICAL RECORD NO.:  [id number]  LOCATION:  1407                         FACILITY:  [facility]  PHYSICIAN:  Excell Seltzer. Annabell Howells, M.D.    DATE OF BIRTH:  Nov 23, 1931  DATE OF PROCEDURE:  10/17/2011 DATE OF DISCHARGE:                              OPERATIVE REPORT   PROCEDURES: 1. Transrectal ultrasound of the prostate with ultrasound-guided     prostate biopsy. 2. Cystoscopy. 3. Bilateral scrotal orchiectomy.  PREOPERATIVE DIAGNOSES:  History of hematuria and prostate cancer.  POSTOPERATIVE DIAGNOSES:  History of hematuria and prostate cancer.  SURGEON:  Excell Seltzer. Annabell Howells, MD  ANESTHESIA:  General.  SPECIMEN:  Biopsies from right seminal vesicle and prostate and both testicles were sent for gross only.  DRAINS:  None.  BLOOD LOSS:  Minimal.  COMPLICATIONS:  None.  INDICATIONS:  Jermaine [name] is a 78 year old African American male who presented to the office with a history of hematuria and prior history of a PSA elevation to about 150 a year before.  The PSA done at the visit was over 300 and it was felt that prostate biopsy was indicated with evidence of at least locally advanced disease, bilateral orchiectomies for primary treatment.  Additionally, he had hematuria, this was felt most likely be secondary to an infection, but cystoscopy was felt to be indicated.  FINDINGS OF PROCEDURE:  He was given Cipro and Rocephin.  He was taken to the operating room where general anesthetic was induced.  He was placed in the left lateral decubitus position.  The ultrasound probe was assembled and inserted, and scanning was performed.  Examination revealed seminal vesicles with relatively solid appearance on the right, but normal on the left.  His prostate length was 4.94 cm, height was 5.34 cm, width was 4.99 cm, and his volume was 56 mL.  The prostate particularly at the base and extending in the midprostate was diffusely hypoechoic  with increased heterogeneity as we progressed toward the apex.  Once the diagnostic exam was performed, a biopsy was obtained from the right seminal vesicle area, and a sextant biopsy was obtained with portion of the right and left base, mid and apical prostate.  Once the biopsy had been completed, the patient was rolled back supine. His scrotum was clipped.  He was prepped with Betadine scrub followed by Betadine solution and was draped in usual sterile fashion.  Cystoscopy was performed using the 16-French flexible scope. Examination revealed some mild panurethral stricturing, but it did not impede scope passage.  The prostatic urethra was approximately 3 cm in length with trilobar hyperplasia with some degree of obstruction. Examination of the bladder revealed mild trabeculation.  There were some erythematous mucosa on the left posterior lateral wall, but it appeared inflammatory.  The ureteral orifices were unremarkable, no papillary tumors or stones were identified.  The urine was somewhat cloudy, so visualization was not optimal; however, I did aspirate the urine from the bladder and that aided visualization.  Once cystoscopy had been completed, an anterior midline scrotal incision was made with a knife.  The right testicle was delivered from the wound using the Bovie.  The spermatic cord was  divided into two packets.  The testicle was removed and the vascular pedicle and vas were then doubly ligated with 0 Vicryl ties.  The left testicle was delivered and removed in an identical fashion.  Once the testicles were removed and hemostasis was achieved, the dartos was closed using a running 3-0 chromic suture with care being taken to incorporate the median raphe.  The skin was closed with a running vertical mattress 3-0 chromic.  Dressing of 4x4s, fluffs, Kerlix, and a scrotal support was applied.  The patient will be admitted overnight because he has poor support structure at  home.  There were no complications.     Excell Seltzer. Annabell Howells, M.D.     JJW/MEDQ  D:  10/17/2011  T:  10/18/2011  Job:  [id number]

## 2011-10-18 NOTE — Discharge Summary (Signed)
Physician Discharge Summary  Patient ID: Jermaine [name] MRN: [id number] DOB/AGE: Sep 19, 1931 78 y.o.  Admit date: 10/17/2011 Discharge date: 10/18/2011  Admission Diagnoses: Prostate cancer and hematuria.  Discharge Diagnoses: Prostate cancer, hematuria and UTI.     Discharged Condition: good  Hospital Course: [name] was brought in for evaluation of hematuria and bilateral orchiectomy and prostate biopsy for a clinical diagnosis of prostate cancer with a PSA of 350 and a very hard prostate on exam.  He had a UTI that was initially treated with cipro with clearance of the hematuria.   He was take to the OR on 5/15 and had a prostate [place] and biopsy with cystoscopy and bilateral scrotal orchiectomy.  He was initially covered with rocephin and cipro per routine but he was found to have enterococcus on a urine culture and was placed on ampicillin in addition to cipro postoperatively.  His postop course has been unremarkable.  The cystoscopy just showed some inflammation.  His prostate US showed possible right seminal vesical involvement and probable diffuse cancer of the prostate.  He is afebrile and without complaints this morning.   Consults: None  Significant Diagnostic Studies: Prostate [place] with biopsy and cystoscopy  Treatments: Bilateral orchiectomy.   Discharge Exam: Blood pressure 100/59, pulse 80, temperature 98.3 F (36.8 C), temperature source Oral, resp. rate 15, height 5\' 10"  (1.778 m), weight 45.3 kg (99 lb 13.9 oz), SpO2 98.00%. General appearance: alert and no distress Male genitalia: normal, Wound intact with no swelling.   Disposition: 06-Home-Health Care Svc  Discharge Orders    Future Orders Please Complete By Expires   Diet - low sodium heart healthy      Increase activity slowly      No dressing needed          Medication List     As of 10/18/2011  8:27 AM    STOP taking these medications         HYDROcodone-acetaminophen 5-500 MG per tablet   Commonly  known as: VICODIN      TAKE these medications         albuterol (2.5 MG/3ML) 0.083% nebulizer solution   Commonly known as: PROVENTIL   Take 2.5 mg by nebulization every 6 (six) hours as needed. For shortness of breath      ampicillin 500 MG capsule   Commonly known as: PRINCIPEN   Take 1 capsule (500 mg total) by mouth 4 (four) times daily.      baclofen 10 MG tablet   Commonly known as: LIORESAL   Take 10 mg by mouth 2 (two) times daily.      HYDROcodone-acetaminophen 5-325 MG per tablet   Commonly known as: NORCO/VICODIN   Take 1 tablet by mouth every 6 (six) hours as needed for pain.      omeprazole 20 MG capsule   Commonly known as: PRILOSEC   Take 1 capsule (20 mg total) by mouth daily. For stomach upset and to prevent stomach ulcers.           Follow-up Information    Follow up with Anner Crete, MD. On 10/27/2011. (830am)    Contact information:   [address] [place] [address] [phone]          Signed: Anner Crete 10/18/2011, 8:27 AM

## 2011-10-27 ENCOUNTER — Ambulatory Visit: Payer: Medicare Other | Admitting: Urology

## 2011-12-29 ENCOUNTER — Ambulatory Visit: Payer: Medicare Other | Admitting: Urology

## 2012-05-02 ENCOUNTER — Inpatient Hospital Stay (HOSPITAL_COMMUNITY): Payer: Medicare Other

## 2012-05-02 ENCOUNTER — Emergency Department (HOSPITAL_COMMUNITY): Payer: Medicare Other

## 2012-05-02 ENCOUNTER — Encounter (HOSPITAL_COMMUNITY): Payer: Self-pay

## 2012-05-02 ENCOUNTER — Inpatient Hospital Stay (HOSPITAL_COMMUNITY)
Admission: EM | Admit: 2012-05-02 | Discharge: 2012-05-10 | DRG: 194 | Disposition: A | Discharge status: Skilled Nursing Facility | Payer: Medicare Other | Attending: Internal Medicine | Admitting: Internal Medicine

## 2012-05-02 DIAGNOSIS — Z86718 Personal history of other venous thrombosis and embolism: Secondary | ICD-10-CM

## 2012-05-02 DIAGNOSIS — R319 Hematuria, unspecified: Secondary | ICD-10-CM | POA: Diagnosis present

## 2012-05-02 DIAGNOSIS — M4800 Spinal stenosis, site unspecified: Secondary | ICD-10-CM | POA: Diagnosis present

## 2012-05-02 DIAGNOSIS — D638 Anemia in other chronic diseases classified elsewhere: Secondary | ICD-10-CM | POA: Diagnosis present

## 2012-05-02 DIAGNOSIS — E876 Hypokalemia: Secondary | ICD-10-CM | POA: Diagnosis present

## 2012-05-02 DIAGNOSIS — Z96649 Presence of unspecified artificial hip joint: Secondary | ICD-10-CM

## 2012-05-02 DIAGNOSIS — IMO0001 Reserved for inherently not codable concepts without codable children: Secondary | ICD-10-CM

## 2012-05-02 DIAGNOSIS — J189 Pneumonia, unspecified organism: Principal | ICD-10-CM

## 2012-05-02 DIAGNOSIS — F10239 Alcohol dependence with withdrawal, unspecified: Secondary | ICD-10-CM | POA: Diagnosis present

## 2012-05-02 DIAGNOSIS — M25559 Pain in unspecified hip: Secondary | ICD-10-CM | POA: Diagnosis present

## 2012-05-02 DIAGNOSIS — E162 Hypoglycemia, unspecified: Secondary | ICD-10-CM

## 2012-05-02 DIAGNOSIS — F1010 Alcohol abuse, uncomplicated: Secondary | ICD-10-CM

## 2012-05-02 DIAGNOSIS — Z8546 Personal history of malignant neoplasm of prostate: Secondary | ICD-10-CM

## 2012-05-02 DIAGNOSIS — F17200 Nicotine dependence, unspecified, uncomplicated: Secondary | ICD-10-CM | POA: Diagnosis present

## 2012-05-02 DIAGNOSIS — J4489 Other specified chronic obstructive pulmonary disease: Secondary | ICD-10-CM | POA: Diagnosis present

## 2012-05-02 DIAGNOSIS — F1020 Alcohol dependence, uncomplicated: Secondary | ICD-10-CM

## 2012-05-02 DIAGNOSIS — F10229 Alcohol dependence with intoxication, unspecified: Secondary | ICD-10-CM | POA: Diagnosis present

## 2012-05-02 DIAGNOSIS — M8708 Idiopathic aseptic necrosis of bone, other site: Secondary | ICD-10-CM | POA: Diagnosis present

## 2012-05-02 DIAGNOSIS — F10929 Alcohol use, unspecified with intoxication, unspecified: Secondary | ICD-10-CM | POA: Diagnosis present

## 2012-05-02 DIAGNOSIS — F10939 Alcohol use, unspecified with withdrawal, unspecified: Secondary | ICD-10-CM | POA: Diagnosis present

## 2012-05-02 DIAGNOSIS — G8929 Other chronic pain: Secondary | ICD-10-CM | POA: Diagnosis present

## 2012-05-02 DIAGNOSIS — J449 Chronic obstructive pulmonary disease, unspecified: Secondary | ICD-10-CM | POA: Diagnosis present

## 2012-05-02 DIAGNOSIS — Z720 Tobacco use: Secondary | ICD-10-CM | POA: Diagnosis present

## 2012-05-02 DIAGNOSIS — R64 Cachexia: Secondary | ICD-10-CM | POA: Diagnosis present

## 2012-05-02 DIAGNOSIS — Z681 Body mass index (BMI) 19 or less, adult: Secondary | ICD-10-CM

## 2012-05-02 DIAGNOSIS — Z993 Dependence on wheelchair: Secondary | ICD-10-CM

## 2012-05-02 LAB — URINALYSIS, ROUTINE W REFLEX MICROSCOPIC
Nitrite: POSITIVE — AB
Specific Gravity, Urine: >1.03 — ABNORMAL HIGH (ref 1.005–1.030)
Urobilinogen, UA: 1 mg/dL (ref 0.0–1.0)
pH: 5.5 (ref 5.0–8.0)

## 2012-05-02 LAB — CBC WITH DIFFERENTIAL/PLATELET
Basophils Absolute: 0.1 10*3/uL (ref 0.0–0.1)
Eosinophils Relative: 0 % (ref 0–5)
HCT: 30 % — ABNORMAL LOW (ref 39.0–52.0)
Lymphocytes Relative: 16 % (ref 12–46)
Lymphs Abs: 0.9 10*3/uL (ref 0.7–4.0)
MCV: 100 fL (ref 78.0–100.0)
Monocytes Absolute: 0.4 10*3/uL (ref 0.1–1.0)
Neutro Abs: 4 10*3/uL (ref 1.7–7.7)
Platelets: 166 10*3/uL (ref 150–400)
RBC: 3 MIL/uL — ABNORMAL LOW (ref 4.22–5.81)
RDW: 21.7 % — ABNORMAL HIGH (ref 11.5–15.5)
WBC: 5.3 10*3/uL (ref 4.0–10.5)

## 2012-05-02 LAB — GLUCOSE, CAPILLARY
Glucose-Capillary: 115 mg/dL — ABNORMAL HIGH (ref 70–99)
Glucose-Capillary: 156 mg/dL — ABNORMAL HIGH (ref 70–99)
Glucose-Capillary: 51 mg/dL — ABNORMAL LOW (ref 70–99)
Glucose-Capillary: 102 mg/dL — ABNORMAL HIGH (ref 70–99)
Glucose-Capillary: 114 mg/dL — ABNORMAL HIGH (ref 70–99)
Glucose-Capillary: 79 mg/dL (ref 70–99)
Glucose-Capillary: 73 mg/dL (ref 70–99)
Glucose-Capillary: 169 mg/dL — ABNORMAL HIGH (ref 70–99)

## 2012-05-02 LAB — COMPREHENSIVE METABOLIC PANEL
ALT: 12 U/L (ref 0–53)
AST: 78 U/L — ABNORMAL HIGH (ref 0–37)
Alkaline Phosphatase: 130 U/L — ABNORMAL HIGH (ref 39–117)
CO2: 18 mEq/L — ABNORMAL LOW (ref 19–32)
Calcium: 8.2 mg/dL — ABNORMAL LOW (ref 8.4–10.5)
Chloride: 96 mEq/L (ref 96–112)
GFR calc Af Amer: >90 mL/min (ref >90)
GFR calc non Af Amer: >90 mL/min (ref >90)
Glucose, Bld: 113 mg/dL — ABNORMAL HIGH (ref 70–99)
Sodium: 137 mEq/L (ref 135–145)
Total Bilirubin: 0.5 mg/dL (ref 0.3–1.2)

## 2012-05-02 LAB — POCT I-STAT TROPONIN I: Troponin i, poc: 0 ng/mL (ref 0.00–0.08)

## 2012-05-02 LAB — RAPID URINE DRUG SCREEN, HOSP PERFORMED
Barbiturates: NOT DETECTED
Tetrahydrocannabinol: NOT DETECTED

## 2012-05-02 LAB — URINE MICROSCOPIC-ADD ON

## 2012-05-02 MED ORDER — ONDANSETRON HCL 4 MG/2ML IJ SOLN
4.0000 mg | Freq: Four times a day (QID) | INTRAMUSCULAR | Status: DC | PRN
  Administered 2012-05-02: 4 mg via INTRAVENOUS
  Filled 2012-05-02: qty 2

## 2012-05-02 MED ORDER — FOLIC ACID 1 MG PO TABS
1.0000 mg | ORAL_TABLET | Freq: Every day | ORAL | Status: DC
  Administered 2012-05-03 – 2012-05-08 (×6): 1 mg via ORAL
  Filled 2012-05-02 (×9): qty 1

## 2012-05-02 MED ORDER — KCL IN DEXTROSE-NACL 10-5-0.45 MEQ/L-%-% IV SOLN
INTRAVENOUS | Status: DC
  Administered 2012-05-02: 14:00:00 via INTRAVENOUS
  Filled 2012-05-02 (×3): qty 1000

## 2012-05-02 MED ORDER — DEXTROSE 50 % IV SOLN
INTRAVENOUS | Status: AC
  Filled 2012-05-02: qty 50

## 2012-05-02 MED ORDER — ENOXAPARIN SODIUM 40 MG/0.4ML ~~LOC~~ SOLN
40.0000 mg | SUBCUTANEOUS | Status: DC
  Administered 2012-05-02 – 2012-05-03 (×2): 40 mg via SUBCUTANEOUS
  Filled 2012-05-02 (×2): qty 0.4

## 2012-05-02 MED ORDER — LORAZEPAM 2 MG/ML IJ SOLN
1.0000 mg | Freq: Four times a day (QID) | INTRAMUSCULAR | Status: DC | PRN
  Administered 2012-05-05 (×2): 1 mg via INTRAVENOUS
  Filled 2012-05-02 (×3): qty 1

## 2012-05-02 MED ORDER — DEXTROSE 50 % IV SOLN
INTRAVENOUS | Status: AC
  Administered 2012-05-02: 50 mL
  Filled 2012-05-02: qty 50

## 2012-05-02 MED ORDER — THIAMINE HCL 100 MG/ML IJ SOLN
100.0000 mg | Freq: Every day | INTRAMUSCULAR | Status: DC
  Administered 2012-05-02 – 2012-05-08 (×4): 100 mg via INTRAVENOUS
  Filled 2012-05-02 (×4): qty 2

## 2012-05-02 MED ORDER — DEXTROSE 5 % IV SOLN
1.0000 g | Freq: Once | INTRAVENOUS | Status: AC
  Administered 2012-05-02: 1 g via INTRAVENOUS
  Filled 2012-05-02 (×2): qty 10

## 2012-05-02 MED ORDER — DEXTROSE 50 % IV SOLN
25.0000 mL | Freq: Once | INTRAVENOUS | Status: AC | PRN
  Administered 2012-05-02: 25 mL via INTRAVENOUS

## 2012-05-02 MED ORDER — SODIUM CHLORIDE 0.9 % IV SOLN
INTRAVENOUS | Status: DC
  Administered 2012-05-02: 11:00:00 via INTRAVENOUS

## 2012-05-02 MED ORDER — ACETAMINOPHEN 325 MG PO TABS: 650.0000 mg | ORAL_TABLET | Freq: Four times a day (QID) | ORAL | Status: DC | PRN

## 2012-05-02 MED ORDER — ADULT MULTIVITAMIN W/MINERALS CH
1.0000 | ORAL_TABLET | Freq: Every day | ORAL | Status: DC
  Administered 2012-05-03 – 2012-05-08 (×6): 1 via ORAL
  Filled 2012-05-02 (×9): qty 1

## 2012-05-02 MED ORDER — DEXTROSE-NACL 5-0.45 % IV SOLN
INTRAVENOUS | Status: DC
  Administered 2012-05-02 – 2012-05-09 (×13): via INTRAVENOUS

## 2012-05-02 MED ORDER — ONDANSETRON HCL 4 MG PO TABS: 4.0000 mg | ORAL_TABLET | Freq: Four times a day (QID) | ORAL | Status: DC | PRN

## 2012-05-02 MED ORDER — NICOTINE 14 MG/24HR TD PT24
14.0000 mg | MEDICATED_PATCH | Freq: Every day | TRANSDERMAL | Status: DC
  Administered 2012-05-02 – 2012-05-10 (×8): 14 mg via TRANSDERMAL
  Filled 2012-05-02 (×9): qty 1

## 2012-05-02 MED ORDER — LEVOFLOXACIN IN D5W 750 MG/150ML IV SOLN
750.0000 mg | INTRAVENOUS | Status: AC
  Administered 2012-05-03 – 2012-05-07 (×5): 750 mg via INTRAVENOUS
  Filled 2012-05-02 (×6): qty 150

## 2012-05-02 MED ORDER — ACETAMINOPHEN 650 MG RE SUPP
650.0000 mg | Freq: Four times a day (QID) | RECTAL | Status: DC | PRN
  Administered 2012-05-05 – 2012-05-06 (×3): 650 mg via RECTAL
  Filled 2012-05-02 (×3): qty 1

## 2012-05-02 MED ORDER — LORAZEPAM 1 MG PO TABS: 1.0000 mg | ORAL_TABLET | Freq: Four times a day (QID) | ORAL | Status: DC | PRN

## 2012-05-02 MED ORDER — SODIUM CHLORIDE 0.9 % IJ SOLN
3.0000 mL | Freq: Two times a day (BID) | INTRAMUSCULAR | Status: DC
  Administered 2012-05-07 – 2012-05-10 (×5): 3 mL via INTRAVENOUS
  Filled 2012-05-02: qty 3

## 2012-05-02 MED ORDER — VITAMIN B-1 100 MG PO TABS
100.0000 mg | ORAL_TABLET | Freq: Every day | ORAL | Status: DC
  Administered 2012-05-03 – 2012-05-05 (×3): 100 mg via ORAL
  Filled 2012-05-02 (×6): qty 1

## 2012-05-02 MED ORDER — AZITHROMYCIN 250 MG PO TABS
500.0000 mg | ORAL_TABLET | Freq: Once | ORAL | Status: AC
  Administered 2012-05-02: 500 mg via ORAL
  Filled 2012-05-02: qty 2

## 2012-05-02 NOTE — ED Provider Notes (Signed)
History    This chart was scribed for [employer]. Rubin Payor, MD by Marlyne Beards, ED Scribe. The patient was seen in room APA05/APA05. Patient's care was started at 10:15 AM.    CSN: [id number]  Arrival date & time 05/02/12  1008   First MD Initiated Contact with Patient 05/02/12 1015      Chief Complaint  Patient presents with  . Hypoglycemia    (Consider location/radiation/quality/duration/timing/severity/associated sxs/prior treatment) The history is provided by the patient and the EMS personnel. No language interpreter was used.   Jermaine [name] is a 79 y.o. male with h/o COPD, DVT, alcohol abuse, and PE brought in by EMS who presents to the Emergency Department complaining of Hypoglycemia. EMS states that pt was found on floor by neighbor. The fire department was the first to arrive on the scene. EMS states that pt's CBG went down to 11. Pt was given 1 amp of d50 by EMS. Pt's CBG was rechecked with a reading of 101. Neighbor reported to EMS that pt drinks alcohol frequently without eating. Also stated that his house was full of filth. Pt is currently alert and oriented in the ED, but is disheveled with odor. He states he did not eat anything last night. Pt states he was by himself this morning when incident ocurred. Pt states his appetite has decreased. Pt denies any abdominal pain, fever, chills, cough, nausea, vomiting, diarrhea, SOB, weakness, and any other associated symptoms. Pt is a current everyday tobacco smoker.    Past Medical History  Diagnosis Date  . COPD (chronic obstructive pulmonary disease)   . Chronic hip pain     Avascular necrosis, bilateral hips.  Marland Kitchen Spinal stenosis   . DVT (deep venous thrombosis)   . PE (pulmonary embolism) May/2012.  Marland Kitchen Alcohol abuse   . Elevated PSA     Lost to followup.  . Macrocytic anemia   . Lytic lesion of bone on x-ray May/2012.    Negative bone scan.  . Bilateral pneumonia May/2012.  Marland Kitchen Thoracic compression fracture   . Tobacco abuse    . Alcohol withdrawal syndrome 06/10/2011  . Delirium, drug-induced 06/2011    From Ativan  . Abnormal thyroid function test 06/2011.    Normal TSH and slightly low free T4.    Past Surgical History  Procedure Laterality Date  . Total hip arthroplasty      Left hip.  . Tonsillectomy      as child  . Cystoscopy  10/17/2011    Procedure: CYSTOSCOPY;  Surgeon: Anner Crete, MD;  Location: [facility];  Service: Urology;  Laterality: N/A;  Cystoscopy/Orchiectomy/Transrectal Ultrasound Guided Prostate Biopsy  . Orchiectomy  10/17/2011    Procedure: ORCHIECTOMY;  Surgeon: Anner Crete, MD;  Location: [facility];  Service: Urology;  Laterality: Bilateral;  . Prostate biopsy  10/17/2011    Procedure: BIOPSY TRANSRECTAL ULTRASONIC PROSTATE (TUBP);  Surgeon: Anner Crete, MD;  Location: [facility];  Service: Urology;  Laterality: N/A;    No family history on file.  History  Substance Use Topics  . Smoking status: Current Every Day Smoker -- 0.50 packs/day for 60 years    Types: Cigarettes  . Smokeless tobacco: Not on file  . Alcohol Use: 24.0 oz/week    40 Shots of liquor per week     Comment: daily-HAS NOT DRANK ANY ALCOHOL IN 4 MONTHS       Review of Systems  All other systems reviewed and are negative.    Allergies  Review of patient's allergies indicates no known allergies.  Home Medications   Current Outpatient Rx  Name  Route  Sig  Dispense  Refill  . ASPIRIN EC PO   Oral   Take 1 tablet by mouth as needed.         . baclofen (LIORESAL) 10 MG tablet   Oral   Take 10 mg by mouth 2 (two) times daily.         Marland Kitchen albuterol (PROVENTIL) (2.5 MG/3ML) 0.083% nebulizer solution   Nebulization   Take 2.5 mg by nebulization every 6 (six) hours as needed. For shortness of breath           BP 123/85  Pulse 60  Temp(Src) 97.6 F (36.4 C) (Oral)  Resp 16  Ht 5\' 10"  (1.778 m)  Wt 100 lb (45.36 kg)  BMI 14.35 kg/m2  SpO2 97%  Physical Exam  Nursing note and vitals  reviewed. Constitutional: He is oriented to person, place, and time. No distress.  HENT:  Head: Normocephalic and atraumatic.  Poor dentition  Eyes: EOM are normal.  Neck: Neck supple. No tracheal deviation present.  Cardiovascular: Normal rate.   Pulmonary/Chest: Effort normal. No respiratory distress. He has wheezes.  Prolonged expirations   Abdominal: Soft. Bowel sounds are normal. He exhibits mass. There is no tenderness.  Musculoskeletal: Normal range of motion. He exhibits no edema.  No peripheral edema  Neurological: He is alert and oriented to person, place, and time.  Skin: Skin is warm and dry.  Psychiatric: He has a normal mood and affect. His behavior is normal.    ED Course  Procedures (including critical care time) DIAGNOSTIC STUDIES: Oxygen Saturation is 97% on room air, adequate by my interpretation.    COORDINATION OF CARE: 10:26 AM Discussed with pt that an EKG, urine analysis, blood test, and X-rays were ordered for further evaluation of pt's sx's.    Labs Reviewed  GLUCOSE, CAPILLARY - Abnormal; Notable for the following:    Glucose-Capillary 115 (*)    All other components within normal limits  CBC WITH DIFFERENTIAL - Abnormal; Notable for the following:    RBC 3.00 (*)    Hemoglobin 10.3 (*)    HCT 30.0 (*)    MCH 34.3 (*)    RDW 21.7 (*)    All other components within normal limits  COMPREHENSIVE METABOLIC PANEL - Abnormal; Notable for the following:    Potassium 3.1 (*)    CO2 18 (*)    Glucose, Bld 113 (*)    Calcium 8.2 (*)    Albumin 3.0 (*)    AST 78 (*)    Alkaline Phosphatase 130 (*)    All other components within normal limits  ETHANOL - Abnormal; Notable for the following:    Alcohol, Ethyl (B) 87 (*)    All other components within normal limits  URINALYSIS, ROUTINE W REFLEX MICROSCOPIC - Abnormal; Notable for the following:    APPearance HAZY (*)    Specific Gravity, Urine >1.030 (*)    Glucose, UA 250 (*)    Hgb urine dipstick  LARGE (*)    Bilirubin Urine SMALL (*)    Ketones, ur 40 (*)    Protein, ur 30 (*)    Nitrite POSITIVE (*)    Leukocytes, UA TRACE (*)    All other components within normal limits  URINE MICROSCOPIC-ADD ON - Abnormal; Notable for the following:    Bacteria, UA MANY (*)    All other components  within normal limits  GLUCOSE, CAPILLARY - Abnormal; Notable for the following:    Glucose-Capillary 36 (*)    All other components within normal limits  GLUCOSE, CAPILLARY - Abnormal; Notable for the following:    Glucose-Capillary 156 (*)    All other components within normal limits  URINE RAPID DRUG SCREEN (HOSP PERFORMED)  TSH  POCT I-STAT TROPONIN I   Dg Chest Port 1 View  05/02/2012  *RADIOLOGY REPORT*  Clinical Data: Hypoglycemia  PORTABLE CHEST - 1 VIEW  Comparison: 09/26/2010  Findings: Cardiomediastinal silhouette is stable.  Mild hyperinflation.  No pulmonary edema.  Streaky left base medially atelectasis or infiltrate.  IMPRESSION: Mild hyperinflation.  No pulmonary edema.  Streaky left base medially atelectasis or infiltrate.   Original Report Authenticated By: Natasha Mead, M.D.      1. Hypoglycemia   2. CAP (community acquired pneumonia)      Date: 05/02/2012  Rate: 87  Rhythm: normal sinus rhythm, premature atrial contractions (PAC) and premature ventricular contractions (PVC)  QRS Axis: left  Intervals: normal  ST/T Wave abnormalities: nonspecific ST/T changes  Conduction Disutrbances:none  Narrative Interpretation: PACs and PVCs are new  Old EKG Reviewed: changes noted    MDM  Patient presented with altered mental status and hypoglycemia with sugar of 11. He has eaten half a sandwich here and his sugar dropped again down to 37. He was started on the day 5 drip. He has a history of heavy drinking. Also has possible pneumonia on x-ray. He has  been managing poorly at home. Will admit to internal medicine.    I personally performed the services described in this  documentation, which was scribed in my presence. The recorded information has been reviewed and is accurate.          Juliet Rude. Rubin Payor, MD 05/02/12 [phone]

## 2012-05-02 NOTE — Progress Notes (Signed)
Report called to Tobi Bastos in [place].

## 2012-05-02 NOTE — ED Notes (Signed)
In and out cath performed with difficulty, coude cath used and able to get small amount of urine in returned with blood noted in urine

## 2012-05-02 NOTE — Progress Notes (Addendum)
ANTIBIOTIC CONSULT NOTE - INITIAL  Pharmacy Consult for Renal Adjustment Antibiotics Indication: pneumonia  No Known Allergies  Patient Measurements: Height: 5\' 10"  (177.8 cm) Weight: 100 lb (45.36 kg) IBW/kg (Calculated) : 73   Vital Signs: Temp: 97.6 F (36.4 C) (05/01 1009) Temp src: Oral (05/01 1009) BP: 149/84 mmHg (05/01 1313) Pulse Rate: 77 (05/01 1313) Intake/Output from previous day:   Intake/Output from this shift:    Labs:  Recent Labs  05/02/12 1042  WBC 5.3  HGB 10.3*  PLT 166  CREATININE 0.63   Estimated Creatinine Clearance: 48.9 ml/min (by C-G formula based on Cr of 0.63). No results found for this basename: VANCOTROUGH, VANCOPEAK, VANCORANDOM, GENTTROUGH, GENTPEAK, GENTRANDOM, TOBRATROUGH, TOBRAPEAK, TOBRARND, AMIKACINPEAK, AMIKACINTROU, AMIKACIN,  in the last 72 hours   Microbiology: No results found for this or any previous visit (from the past 720 hour(s)).  Medical History: Past Medical History  Diagnosis Date  . COPD (chronic obstructive pulmonary disease)   . Chronic hip pain     Avascular necrosis, bilateral hips.  Marland Kitchen Spinal stenosis   . DVT (deep venous thrombosis)   . PE (pulmonary embolism) May/2012.  Marland Kitchen Alcohol abuse   . Elevated PSA     Lost to followup.  . Macrocytic anemia   . Lytic lesion of bone on x-ray May/2012.    Negative bone scan.  . Bilateral pneumonia May/2012.  Marland Kitchen Thoracic compression fracture   . Tobacco abuse   . Alcohol withdrawal syndrome 06/10/2011  . Delirium, drug-induced 06/2011    From Ativan  . Abnormal thyroid function test 06/2011.    Normal TSH and slightly low free T4.    Medications:  Scheduled:  . [COMPLETED] azithromycin  500 mg Oral Once  . [COMPLETED] cefTRIAXone (ROCEPHIN)  IV  1 g Intravenous Once  . [COMPLETED] dextrose      . enoxaparin (LOVENOX) injection  40 mg Subcutaneous Q24H  . folic acid  1 mg Oral Daily  . [START ON 05/03/2012] levofloxacin (LEVAQUIN) IV  750 mg Intravenous Q24H  .  multivitamin with minerals  1 tablet Oral Daily  . nicotine  14 mg Transdermal Daily  . sodium chloride  3 mL Intravenous Q12H  . thiamine  100 mg Oral Daily   Or  . thiamine  100 mg Intravenous Daily   Assessment: Levaquin 750 mg IV every 24 hour only antibiotic at this time CrCl 48.9 ml/min   Goal of Therapy:  eradicate infection  Plan:  Continue Levaquin 750 mg IV every 24 hours F/U SCR in AM CrCl borderline to reduce to every 48 hours ( > 50 ml/min )    Raquel James, Trayce Caravello Bennett 05/02/2012,2:49 PM

## 2012-05-02 NOTE — ED Notes (Signed)
Per EMS, pt was found on floor by neighbor. Fire dept was first to arrive at scene and pt was found to have cbg of 11. Was given 1 amp of d50 by EMS and recheck of cbg was 101. Pt alert and oriented on arrival to ED. Pt disheveled with odor. EMS stated house was filthy. Neighbor stated to EMS, pt drinks ETOH and does not eat

## 2012-05-02 NOTE — H&P (Addendum)
History and Physical  Jermaine [name] WUJ:[id number] DOB: 1931/11/01 DOA: 05/02/2012  Referring physician: Benjiman Core, MD PCP: Avon Gully, MD   Chief Complaint: Verna Czech out  HPI:  79 year old man with history of alcohol abuse presented to the emergency department with a history of syncope. EMS reports blood sugar of 11 in the field, responded to D50. In the emergency department hypoglycemia recurred and oral intake was poor and therefore admission was sought.  History obtained from patient at the bedside. He reports poor oral intake at baseline. He eats very little. Drinks half a pint of alcohol per day. This morning he was in his wheelchair and remembers losing consciousness. A friend came to check on him and called EMS. He was noted to be markedly hypoglycemic and transport to the emergency department. He has had a cough for 2 weeks it has been productive. Review the record is notable for hospitalization 06/2011 for similar presentation and surrounding circumstances. Also developed alcohol withdrawal syndrome at that time. Curiously he was admitted 10/2011 for surgery and there was no evidence of withdrawal at that time.  In the emergency department he was noted to be afebrile with stable vital signs. Chest x-ray suggested pneumonia.  Review of Systems:  Negative for fever, visual changes, sore throat, rash, new muscle aches, chest pain, SOB, dysuria, bleeding, n/v/abdominal pain.  Past Medical History  Diagnosis Date  . COPD (chronic obstructive pulmonary disease)   . Chronic hip pain     Avascular necrosis, bilateral hips.  Marland Kitchen Spinal stenosis   . DVT (deep venous thrombosis)   . PE (pulmonary embolism) May/2012.  Marland Kitchen Alcohol abuse   . Elevated PSA     Lost to followup.  . Macrocytic anemia   . Lytic lesion of bone on x-ray May/2012.    Negative bone scan.  . Bilateral pneumonia May/2012.  Marland Kitchen Thoracic compression fracture   . Tobacco abuse   . Alcohol withdrawal syndrome 06/10/2011   . Delirium, drug-induced 06/2011    From Ativan  . Abnormal thyroid function test 06/2011.    Normal TSH and slightly low free T4.    Past Surgical History  Procedure Laterality Date  . Total hip arthroplasty      Left hip.  . Tonsillectomy      as child  . Cystoscopy  10/17/2011    Procedure: CYSTOSCOPY;  Surgeon: Anner Crete, MD;  Location: [facility];  Service: Urology;  Laterality: N/A;  Cystoscopy/Orchiectomy/Transrectal Ultrasound Guided Prostate Biopsy  . Orchiectomy  10/17/2011    Procedure: ORCHIECTOMY;  Surgeon: Anner Crete, MD;  Location: [facility];  Service: Urology;  Laterality: Bilateral;  . Prostate biopsy  10/17/2011    Procedure: BIOPSY TRANSRECTAL ULTRASONIC PROSTATE (TUBP);  Surgeon: Anner Crete, MD;  Location: [facility];  Service: Urology;  Laterality: N/A;    Social History:  reports that he has been smoking Cigarettes.  He has a 30 pack-year smoking history. He does not have any smokeless tobacco history on file. He reports that he drinks about 24.0 ounces of alcohol per week. He reports that he does not use illicit drugs.  No Known Allergies  No family history on file. He is unaware of any particular medical problems in his family.  Prior to Admission medications   Medication Sig Start Date End Date Taking? Authorizing Provider  ASPIRIN EC PO Take 1 tablet by mouth as needed.   Yes Historical Provider, MD  baclofen (LIORESAL) 10 MG tablet Take 10 mg by  mouth 2 (two) times daily.   Yes Historical Provider, MD  albuterol (PROVENTIL) (2.5 MG/3ML) 0.083% nebulizer solution Take 2.5 mg by nebulization every 6 (six) hours as needed. For shortness of breath    Historical Provider, MD   Physical Exam: Filed Vitals:   05/02/12 1009 05/02/12 1313  BP: 123/85 149/84  Pulse: 60 77  Temp: 97.6 F (36.4 C)   TempSrc: Oral   Resp: 16 16  Height: 5\' 10"  (1.778 m)   Weight: 45.36 kg (100 lb)   SpO2: 97% 100%    General: Examined in the emergency department. Appears calm  and comfortable Eyes: PERRL, normal lids, irises ENT: grossly normal hearing, lips & tongue Neck: no LAD, masses or thyromegaly Cardiovascular: RRR, no m/r/g. No LE edema. Respiratory: CTA bilaterally, no w/r/r. Normal respiratory effort. Abdomen: soft, ntnd Skin: no rash or induration seen  Musculoskeletal: grossly normal tone and strength BUE/BLE.  Psychiatric: grossly normal mood and affect, speech fluent and appropriate. Oriented to self, location, month, day, year. Recalls blood sugar report of 11 in the field. Neurologic: Cranial nerves 2-12 intact.  Wt Readings from Last 3 Encounters:  05/02/12 45.36 kg (100 lb)  10/17/11 45.3 kg (99 lb 13.9 oz)  10/17/11 45.3 kg (99 lb 13.9 oz)    Labs on Admission:  Basic Metabolic Panel:  Recent Labs Lab 05/02/12 1042  NA 137  K 3.1*  CL 96  CO2 18*  GLUCOSE 113*  BUN 15  CREATININE 0.63  CALCIUM 8.2*    Liver Function Tests:  Recent Labs Lab 05/02/12 1042  AST 78*  ALT 12  ALKPHOS 130*  BILITOT 0.5  PROT 6.3  ALBUMIN 3.0*   CBC:  Recent Labs Lab 05/02/12 1042  WBC 5.3  NEUTROABS 4.0  HGB 10.3*  HCT 30.0*  MCV 100.0  PLT 166    Recent Labs  05/02/12 1059  TROPIPOC 0.00   CBG:  Recent Labs Lab 05/02/12 1016 05/02/12 1253 05/02/12 1323  GLUCAP 115* 36* 156*     Radiological Exams on Admission: Dg Chest Port 1 View  05/02/2012  *RADIOLOGY REPORT*  Clinical Data: Hypoglycemia  PORTABLE CHEST - 1 VIEW  Comparison: 09/26/2010  Findings: Cardiomediastinal silhouette is stable.  Mild hyperinflation.  No pulmonary edema.  Streaky left base medially atelectasis or infiltrate.  IMPRESSION: Mild hyperinflation.  No pulmonary edema.  Streaky left base medially atelectasis or infiltrate.   Original Report Authenticated By: Natasha Mead, M.D.     EKG: Independently reviewed. Poor quality. Dominant rhythm appears to be sinus with frequent ectopy and one PVC.   Principal Problem:   Hypoglycemia Active  Problems:   Alcoholism /alcohol abuse   Alcohol intoxication   Tobacco abuse   CAP (community acquired pneumonia)   Assessment/Plan 1. Profound hypoglycemia with syncope: Likely secondary to poor oral intake and alcohol abuse. No signs or symptoms of ACS or acute neurologic abnormality. Possibly secondary to developing pneumonia. Dextrose fluids. Monitor blood sugars closely. Expect resolution with adequate oral intake. Telemetry. 2. Suspected community-acquired pneumonia: Empiric antibiotics. CURB 65 score = 1. 3. Alcohol intoxication/abuse: Monitor for withdrawal. There is notation from a previous hospitalization of confusion with Ativan. Not a documented allergy. Monitor closely. Social work Administrator, sports. 4. Chronic hip pain, wheelchair-bound 5. History of prostate cancer, hematuria 6. Smoker: Recommend cessation. Nicotine patch.  Addendum 1710 Hypoglycemic again. CBG 51 on dextrose infusion. Vomiting with attempts at liquids. No abdominal pain. Exam unchanged. Abdomen soft, nontender, nondistended. Per nursing, every hour blood sugar  checks require step down level care. Will transfer. Clinically appears otherwise stable.  Code Status: Full code Family Communication: None present Disposition Plan/Anticipated LOS: Admitting patient to Dr. Letitia Neri service. 2-3 days  Time spent: 60 minutes  Jermaine Sacks, MD  [facility] Pager [phone] 05/02/2012, 2:28 PM

## 2012-05-02 NOTE — ED Notes (Signed)
Pt states that he is not able to stand for CXR, ordered portable, pt states that he uses a wheelchair at home

## 2012-05-02 NOTE — ED Notes (Signed)
Notified nurse of BGL 36

## 2012-05-02 NOTE — Progress Notes (Signed)
Hypoglycemic Event  CBG: 515  Treatment: D50 IV 25 mL  Symptoms: None  Follow-up CBG: Time:1739 CBG Result:114  Possible Reasons for Event: Inadequate meal intake  Comments/MD notified:  Dr. Irene Limbo notified via text page [phone]  Dr. Irene Limbo returned page and stated to transfer patient to [facility]  He will be putting the order in the computer.  Pt will have every hour glucose checks.    Dorene Grebe  Remember to initiate Hypoglycemia Order Set & complete

## 2012-05-03 LAB — OCCULT BLOOD X 1 CARD TO LAB, STOOL: Fecal Occult Bld: NEGATIVE

## 2012-05-03 LAB — CBC
Platelets: 131 10*3/uL — ABNORMAL LOW (ref 150–400)
RBC: 2.42 MIL/uL — ABNORMAL LOW (ref 4.22–5.81)
WBC: 9 10*3/uL (ref 4.0–10.5)

## 2012-05-03 LAB — IRON AND TIBC
Iron: 137 ug/dL — ABNORMAL HIGH (ref 42–135)
Saturation Ratios: 81 % — ABNORMAL HIGH (ref 20–55)
TIBC: 169 ug/dL — ABNORMAL LOW (ref 215–435)
UIBC: 32 ug/dL — ABNORMAL LOW (ref 125–400)

## 2012-05-03 LAB — VITAMIN B12: Vitamin B-12: 346 pg/mL (ref 211–911)

## 2012-05-03 LAB — TSH: TSH: 0.773 u[IU]/mL (ref 0.350–4.500)

## 2012-05-03 LAB — STREP PNEUMONIAE URINARY ANTIGEN: Strep Pneumo Urinary Antigen: NEGATIVE

## 2012-05-03 LAB — GLUCOSE, CAPILLARY
Glucose-Capillary: 206 mg/dL — ABNORMAL HIGH (ref 70–99)
Glucose-Capillary: 208 mg/dL — ABNORMAL HIGH (ref 70–99)

## 2012-05-03 LAB — BASIC METABOLIC PANEL
CO2: 25 mEq/L (ref 19–32)
Calcium: 7.2 mg/dL — ABNORMAL LOW (ref 8.4–10.5)
Chloride: 94 mEq/L — ABNORMAL LOW (ref 96–112)
Glucose, Bld: 212 mg/dL — ABNORMAL HIGH (ref 70–99)
Sodium: 130 mEq/L — ABNORMAL LOW (ref 135–145)

## 2012-05-03 LAB — FOLATE: Folate: 2.6 ng/mL — ABNORMAL LOW

## 2012-05-03 MED ORDER — PRO-STAT SUGAR FREE PO LIQD
30.0000 mL | Freq: Three times a day (TID) | ORAL | Status: DC
  Administered 2012-05-03 – 2012-05-09 (×14): 30 mL via ORAL
  Filled 2012-05-03 (×14): qty 30

## 2012-05-03 MED ORDER — BOOST / RESOURCE BREEZE PO LIQD
1.0000 | Freq: Three times a day (TID) | ORAL | Status: DC
  Administered 2012-05-03 – 2012-05-09 (×10): 1 via ORAL

## 2012-05-03 NOTE — Evaluation (Signed)
Physical Therapy Evaluation Patient Details Name: Jermaine [name] MRN: [id number] DOB: 24-Aug-1931 Today's Date: 05/03/2012 Time: 4098-1191 PT Time Calculation (min): 31 min  PT Assessment / Plan / Recommendation Clinical Impression  Pt was seen for evaluation this morning.  He was alert but minimally cooperative...his major interest was in staying warm.  He is very difficult to understand due to poor dentation but it sounds as if he lives alone in a mobile home and relies on frends/family to bring him food and assist with ADLs.  He is w/c dependent due to rather severe hip and knee flexion contractures and he assures me that he has no difficulty with transfers.  He refused to work with me on transfers as hestates that he:  1.  wants to stay in the bed and keep warm and 2. can't get into our chair as it is too low for him.  He appears to have little to no rehab potential at this point.  He would be very appropriate for ACLF, though, as I do not think that he is able to function adequately at home alone.  I will keep him on our schedule to try to cajole him into working on transfers with me.       PT Assessment  Patient needs continued PT services    Follow Up Recommendations  No PT follow up    Does the patient have the potential to tolerate intense rehabilitation      Barriers to Discharge        Equipment Recommendations  None recommended by PT    Recommendations for Other Services     Frequency Min 2X/week    Precautions / Restrictions Precautions Precautions: Fall Restrictions Weight Bearing Restrictions: No   Pertinent Vitals/Pain       Mobility  Bed Mobility Bed Mobility: Rolling Right;Rolling Left Rolling Right: 5: Supervision;With rail Rolling Left: 5: Supervision;With rail Transfers Details for Transfer Assistance: pt flatly refuses to try to transfer bed to chair...he states that the chair available is too low for him, however it is no lower than a w/c would be...he  apparently has his way of sliding from bed to w/c that he assures me works for him Ambulation/Gait Ambulation/Gait Assistance:  (not ambulatory)    Exercises     PT Diagnosis: Generalized weakness  PT Problem List: Decreased activity tolerance PT Treatment Interventions: Therapeutic activities   PT Goals Acute Rehab PT Goals PT Goal Formulation: With patient Time For Goal Achievement: 05/10/12 Potential to Achieve Goals: Fair Pt will Transfer Bed to Chair/Chair to Bed: with supervision PT Transfer Goal: Bed to Chair/Chair to Bed - Progress: Goal set today  Visit Information  Last PT Received On: 05/03/12    Subjective Data  Subjective: I'm cold...cover me up Patient Stated Goal: none stated   Prior Functioning  Home Living Lives With: Alone Available Help at Discharge: Friend(s) Type of Home: Mobile home Home Access: Stairs to enter Entrance Stairs-Number of Steps: 3 Entrance Stairs-Rails: Right Home Layout: One level Bathroom Shower/Tub:  (does a sponge bath) Home Adaptive Equipment: Wheelchair - manual Additional Comments: pt is w/c dependent...it is very difficult to understand him and it is not clear to me as to othr DME in the home.Marland KitchenMarland KitchenI asked him how he negotioated the steps to his home and It seems that he leans on a friend who helps him hobble up the steps...this is a very unsafe situation due to his severe hip/knee flexion contractures...a ramp at the entrance should be  available to him Prior Function Level of Independence: Needs assistance Needs Assistance: Bathing;Meal Prep;Light Housekeeping Bath: Moderate (cousin's wife helps him bathe) Meal Prep: Total (friends bring food over) Light Housekeeping: Total Able to Take Stairs?: Yes (with max/total assist) Driving: No Vocation: Retired Musician: Expressive difficulties (poor dentation)    Cognition  Cognition Arousal/Alertness: Awake/alert Behavior During Therapy: WFL for tasks  assessed/performed Overall Cognitive Status: Within Functional Limits for tasks assessed    Extremity/Trunk Assessment Right Upper Extremity Assessment RUE ROM/Strength/Tone: WFL for tasks assessed RUE Sensation: WFL - Light Touch RUE Coordination: WFL - gross motor Left Upper Extremity Assessment LUE ROM/Strength/Tone: WFL for tasks assessed LUE Sensation: WFL - Light Touch LUE Coordination: WFL - gross motor Right Lower Extremity Assessment RLE ROM/Strength/Tone: Deficits RLE ROM/Strength/Tone Deficits: 30 degree hip and knee flexion contractures, strength generally 3-/5 RLE Sensation: WFL - Light Touch Left Lower Extremity Assessment LLE ROM/Strength/Tone: Deficits LLE ROM/Strength/Tone Deficits: hip and knee flexion contractures of 45 degrees with strength generally 3-/5...he has an old L hip fx with OTIF LLE Sensation: WFL - Light Touch   Balance    End of Session PT - End of Session Activity Tolerance: Other (comment) (pt not interested in participating in any activity.Marland Kitchen) Patient left: in bed;with bed alarm set;with call bell/phone within reach Nurse Communication: Mobility status  GP     Konrad Penta 05/03/2012, 9:25 AM

## 2012-05-03 NOTE — Progress Notes (Signed)
Subjective:  Patient was admitted yesterday due to severe hypoglycemia, pneumonia and alcohol abuse. His hypoglycemia has improved. He alert and awake and feels better    Objective: Vital signs in last 24 hours: Temp:  [97.6 F (36.4 C)-99.2 F (37.3 C)] 98.4 F (36.9 C) (05/02 0000) Pulse Rate:  [60-108] 83 (05/02 0527) Resp:  [15-22] 19 (05/02 0500) BP: (90-149)/(50-96) 90/61 mmHg (05/02 0500) SpO2:  [92 %-100 %] 100 % (05/02 0500) Weight:  [45.36 kg (100 lb)-50.4 kg (111 lb 1.8 oz)] 50.4 kg (111 lb 1.8 oz) (05/02 0500) Weight change:  Last BM Date: 04/30/12  Intake/Output from previous day: 05/01 0701 - 05/02 0700 In: 1559.2 [I.V.:1559.2] Out: 401 [Urine:400; Stool:1]  PHYSICAL EXAM General appearance: alert, cachectic, fatigued and slowed mentation Resp: diminished breath sounds bilaterally and rhonchi bilaterally Cardio: S1, S2 normal GI: soft, non-tender; bowel sounds normal; no masses,  no organomegaly Extremities: extremities normal, atraumatic, no cyanosis or edema  Lab Results:    @labtest @ [facility] No results found for this basename: PHART, PCO2, PO2ART, TCO2, HCO3,  in the last 72 hours CULTURES Recent Results (from the past 240 hour(s))  CULTURE, BLOOD (ROUTINE X 2)     Status: None   Collection Time    05/02/12  3:09 PM      Result Value Range Status   Specimen Description BLOOD LEFT HAND   Final   Special Requests BOTTLES DRAWN AEROBIC AND ANAEROBIC 6CC   Final   Culture NO GROWTH 1 DAY   Final   Report Status PENDING   Incomplete  CULTURE, BLOOD (ROUTINE X 2)     Status: None   Collection Time    05/02/12  3:15 PM      Result Value Range Status   Specimen Description BLOOD RIGHT ARM   Final   Special Requests     Final   Value: BOTTLES DRAWN AEROBIC AND ANAEROBIC AEB=12CC ANA=8CC   Culture NO GROWTH 1 DAY   Final   Report Status PENDING   Incomplete  MRSA PCR SCREENING     Status: None   Collection Time    05/02/12  6:17 PM      Result Value  Range Status   MRSA by PCR NEGATIVE  NEGATIVE Final   Comment:            The GeneXpert MRSA Assay (FDA     approved for NASAL specimens     only), is one component of a     comprehensive MRSA colonization     surveillance program. It is not     intended to diagnose MRSA     infection nor to guide or     monitor treatment for     MRSA infections.   Studies/Results: Dg Chest Port 1 View  05/02/2012  *RADIOLOGY REPORT*  Clinical Data: Hypoglycemia  PORTABLE CHEST - 1 VIEW  Comparison: 09/26/2010  Findings: Cardiomediastinal silhouette is stable.  Mild hyperinflation.  No pulmonary edema.  Streaky left base medially atelectasis or infiltrate.  IMPRESSION: Mild hyperinflation.  No pulmonary edema.  Streaky left base medially atelectasis or infiltrate.   Original Report Authenticated By: Natasha Mead, M.D.    Dg Abd 2 Views  05/02/2012  *RADIOLOGY REPORT*  Clinical Data: Nausea, vomiting, abdominal pain  ABDOMEN - 2 VIEW  Comparison: 06/08/2011  Findings: Nonspecific bowel gas pattern without disproportionate small bowel dilatation to suggest small bowel obstruction.  No evidence of free air under the diaphragm on the upright view.  Degenerative changes of the visualized thoracolumbar spine. Vascular calcifications.  Status post ORIF of the left hip.  IMPRESSION: No evidence of small bowel obstruction or free air.   Original Report Authenticated By: Charline Bills, M.D.     Medications: I have reviewed the patient's current medications.  Assesment:  Profound hypoglycemia .  Suspected community-acquired pneumonia:  .Alcohol intoxication/abuse:     Chronic hip pain, wheelchair-bound  History of prostate cancer, hematuria Anemia  Plan: Continue to monitor blood sugar Nutritional support Continue IV antibiotics Watch for sign of alcohol withdrawal     LOS: 1 day   Shellyann Wandrey 05/03/2012, 7:54 AM

## 2012-05-03 NOTE — Progress Notes (Addendum)
INITIAL NUTRITION ASSESSMENT  DOCUMENTATION CODES Per approved criteria  -Severe  malnutrition in the context of social or environmental circumstances -Underweight   INTERVENTION:  Resource Breeze po TID, each supplement provides 250 kcal and 9 grams of protein. ProStat 30 ml TID (each 30 ml provides 100 kcal, 15 gr protein) Recommend adv diet as tolerated to Mechanical Soft   NUTRITION DIAGNOSIS: Malnutriton related to inadequate oral intake as evidenced by ETOH abuse and 24# wt loss past 11 months.   Goal: Pt to meet >/= 90% of their estimated nutrition needs  Monitor:  Diet advancement, Po intake, labs and wt trends  Reason for Assessment: Consult- poor po's  79 y.o. male  Admitting Dx: Hypoglycemia  ASSESSMENT: Assessed by RD on 06/09/11. Pt wt at that time 135#. At this time  his UBW-145#. He was given diagnosis moderate malnutrition at that time in the context of social and environmental circumstances due to moderate depletion of body fat and muscle mass which is ongoing..  Today he tells me that over past year his wt has "fallen off". His wt has continued to decrease an additional  24#, 18% in past 11 months He continues to live alone and prepares his own food except occasionally when a friend and his wife come by to check in on him.Reports poor appetite. Poor dentition but says that he follows a regular diet at home and is not a "picky" eater.   Hx of ETOH abuse. His nutritional status has declined further  and is now severe since previous assessment given his ongoing alcohol abuse, inadequate nutrition intake and unplanned wt loss. He is agreeable to receiving a nutritional supplement if it's served at room temperature.   Height: Ht Readings from Last 1 Encounters:  05/02/12 5\' 10"  (1.778 m)    Weight: Wt Readings from Last 1 Encounters:  05/03/12 111 lb 1.8 oz (50.4 kg)    Ideal Body Weight: 166# (75.4 kg)  % Ideal Body Weight: 67%  Wt Readings from Last 10  Encounters:  05/03/12 111 lb 1.8 oz (50.4 kg)  10/17/11 99 lb 13.9 oz (45.3 kg)  10/17/11 99 lb 13.9 oz (45.3 kg)  10/13/11 100 lb (45.36 kg)  06/12/11 110 lb 4.8 oz (50.032 kg)  04/07/11 139 lb (63.05 kg)  08/29/07 132 lb (59.875 kg)    Usual Body Weight: 134.14# (61.2 kg) June 09, 2011 wt from Nutrition assessment   % Usual Body Weight: 82%  BMI:  Body mass index is 15.94 kg/(m^2).Underweight  Estimated Nutritional Needs: Kcal: 1500-1750 Protein: 75-85 gr Fluid: >1500 ml/day  Skin: No issues noted  Diet Order: Clear Liquid  EDUCATION NEEDS: -No education needs identified at this time   Intake/Output Summary (Last 24 hours) at 05/03/12 1100 Last data filed at 05/03/12 0900  Gross per 24 hour  Intake 2059.17 ml  Output    402 ml  Net 1657.17 ml    Last BM: 05/03/12  Labs:   Recent Labs Lab 05/02/12 1042 05/03/12 0427  NA 137 130*  K 3.1* 3.0*  CL 96 94*  CO2 18* 25  BUN 15 10  CREATININE 0.63 0.60  CALCIUM 8.2* 7.2*  GLUCOSE 113* 212*    CBG (last 3)   Recent Labs  05/03/12 0316 05/03/12 0427 05/03/12 0733  GLUCAP 227* 201* 171*    Scheduled Meds: . enoxaparin (LOVENOX) injection  40 mg Subcutaneous Q24H  . folic acid  1 mg Oral Daily  . levofloxacin (LEVAQUIN) IV  750 mg Intravenous  Q24H  . multivitamin with minerals  1 tablet Oral Daily  . nicotine  14 mg Transdermal Daily  . sodium chloride  3 mL Intravenous Q12H  . thiamine  100 mg Oral Daily   Or  . thiamine  100 mg Intravenous Daily    Continuous Infusions: . dextrose 5 % and 0.45% NaCl 125 mL/hr at 05/03/12 1610    Past Medical History  Diagnosis Date  . COPD (chronic obstructive pulmonary disease)   . Chronic hip pain     Avascular necrosis, bilateral hips.  Marland Kitchen Spinal stenosis   . DVT (deep venous thrombosis)   . PE (pulmonary embolism) May/2012.  Marland Kitchen Alcohol abuse   . Elevated PSA     Lost to followup.  . Macrocytic anemia   . Lytic lesion of bone on x-ray May/2012.     Negative bone scan.  . Bilateral pneumonia May/2012.  Marland Kitchen Thoracic compression fracture   . Tobacco abuse   . Alcohol withdrawal syndrome 06/10/2011  . Delirium, drug-induced 06/2011    From Ativan  . Abnormal thyroid function test 06/2011.    Normal TSH and slightly low free T4.    Past Surgical History  Procedure Laterality Date  . Total hip arthroplasty      Left hip.  . Tonsillectomy      as child  . Cystoscopy  10/17/2011    Procedure: CYSTOSCOPY;  Surgeon: Anner Crete, MD;  Location: [facility];  Service: Urology;  Laterality: N/A;  Cystoscopy/Orchiectomy/Transrectal Ultrasound Guided Prostate Biopsy  . Orchiectomy  10/17/2011    Procedure: ORCHIECTOMY;  Surgeon: Anner Crete, MD;  Location: [facility];  Service: Urology;  Laterality: Bilateral;  . Prostate biopsy  10/17/2011    Procedure: BIOPSY TRANSRECTAL ULTRASONIC PROSTATE (TUBP);  Surgeon: Anner Crete, MD;  Location: [facility];  Service: Urology;  Laterality: N/A;    Royann Shivers MS,RD,LDN,CSG Office: [phone] Pager: [phone]

## 2012-05-03 NOTE — Progress Notes (Signed)
UR Chart Review Completed  

## 2012-05-04 LAB — GLUCOSE, CAPILLARY
Glucose-Capillary: 94 mg/dL (ref 70–99)
Glucose-Capillary: 89 mg/dL (ref 70–99)
Glucose-Capillary: 84 mg/dL (ref 70–99)

## 2012-05-04 LAB — CBC
HCT: 22.7 % — ABNORMAL LOW (ref 39.0–52.0)
Hemoglobin: 8.1 g/dL — ABNORMAL LOW (ref 13.0–17.0)
MCHC: 35.7 g/dL (ref 30.0–36.0)
MCV: 97.8 fL (ref 78.0–100.0)
RDW: 20.9 % — ABNORMAL HIGH (ref 11.5–15.5)

## 2012-05-04 MED ORDER — ENOXAPARIN SODIUM 30 MG/0.3ML ~~LOC~~ SOLN
30.0000 mg | SUBCUTANEOUS | Status: DC
  Administered 2012-05-04 – 2012-05-09 (×6): 30 mg via SUBCUTANEOUS
  Filled 2012-05-04 (×6): qty 0.3

## 2012-05-04 NOTE — Progress Notes (Signed)
Subjective:  Patient feels better today. His appetite is improving. His blood sugar improved.  Objective: Vital signs in last 24 hours: Temp:  [97.5 F (36.4 C)-98.9 F (37.2 C)] 97.5 F (36.4 C) (05/03 0800) Pulse Rate:  [55-67] 55 (05/03 0600) Resp:  [12-21] 20 (05/03 0600) BP: (93-134)/(52-86) 119/67 mmHg (05/03 0600) SpO2:  [96 %-100 %] 100 % (05/03 0600) Weight:  [49.3 kg (108 lb 11 oz)] 49.3 kg (108 lb 11 oz) (05/03 0500) Weight change: 3.94 kg (8 lb 11 oz) Last BM Date: 05/03/12  Intake/Output from previous day: 05/02 0701 - 05/03 0700 In: 4345 [P.O.:1320; I.V.:2875; IV Piggyback:150] Out: 1902 [Urine:1900; Stool:2]  PHYSICAL EXAM General appearance: alert, cachectic, fatigued and slowed mentation Resp: diminished breath sounds bilaterally and rhonchi bilaterally Cardio: S1, S2 normal GI: soft, non-tender; bowel sounds normal; no masses,  no organomegaly Extremities: extremities normal, atraumatic, no cyanosis or edema  Lab Results:    @labtest @ [facility] No results found for this basename: PHART, PCO2, PO2ART, TCO2, HCO3,  in the last 72 hours CULTURES Recent Results (from the past 240 hour(s))  CULTURE, BLOOD (ROUTINE X 2)     Status: None   Collection Time    05/02/12  3:09 PM      Result Value Range Status   Specimen Description BLOOD LEFT HAND   Final   Special Requests BOTTLES DRAWN AEROBIC AND ANAEROBIC 6CC   Final   Culture NO GROWTH 2 DAYS   Final   Report Status PENDING   Incomplete  CULTURE, BLOOD (ROUTINE X 2)     Status: None   Collection Time    05/02/12  3:15 PM      Result Value Range Status   Specimen Description BLOOD RIGHT ARM   Final   Special Requests     Final   Value: BOTTLES DRAWN AEROBIC AND ANAEROBIC AEB=12CC ANA=8CC   Culture NO GROWTH 2 DAYS   Final   Report Status PENDING   Incomplete  MRSA PCR SCREENING     Status: None   Collection Time    05/02/12  6:17 PM      Result Value Range Status   MRSA by PCR NEGATIVE  NEGATIVE  Final   Comment:            The GeneXpert MRSA Assay (FDA     approved for NASAL specimens     only), is one component of a     comprehensive MRSA colonization     surveillance program. It is not     intended to diagnose MRSA     infection nor to guide or     monitor treatment for     MRSA infections.   Studies/Results: Dg Chest Port 1 View  05/02/2012  *RADIOLOGY REPORT*  Clinical Data: Hypoglycemia  PORTABLE CHEST - 1 VIEW  Comparison: 09/26/2010  Findings: Cardiomediastinal silhouette is stable.  Mild hyperinflation.  No pulmonary edema.  Streaky left base medially atelectasis or infiltrate.  IMPRESSION: Mild hyperinflation.  No pulmonary edema.  Streaky left base medially atelectasis or infiltrate.   Original Report Authenticated By: Natasha Mead, M.D.    Dg Abd 2 Views  05/02/2012  *RADIOLOGY REPORT*  Clinical Data: Nausea, vomiting, abdominal pain  ABDOMEN - 2 VIEW  Comparison: 06/08/2011  Findings: Nonspecific bowel gas pattern without disproportionate small bowel dilatation to suggest small bowel obstruction.  No evidence of free air under the diaphragm on the upright view.  Degenerative changes of the visualized thoracolumbar spine. Vascular  calcifications.  Status post ORIF of the left hip.  IMPRESSION: No evidence of small bowel obstruction or free air.   Original Report Authenticated By: Charline Bills, M.D.     Medications: I have reviewed the patient's current medications.  Assesment:  Profound hypoglycemia .  Suspected community-acquired pneumonia:  .Alcohol intoxication/abuse:     Chronic hip pain, wheelchair-bound  History of prostate cancer, hematuria Anemia  Plan: Continue to monitor blood sugar Nutritional support Continue IV antibiotics Watch for sign of alcohol withdrawal     LOS: 2 days   Jermaine [name] 05/04/2012, 9:42 AM

## 2012-05-05 LAB — GLUCOSE, CAPILLARY: Glucose-Capillary: 84 mg/dL (ref 70–99)

## 2012-05-05 LAB — CBC
HCT: 26.7 % — ABNORMAL LOW (ref 39.0–52.0)
Platelets: 120 10*3/uL — ABNORMAL LOW (ref 150–400)
RBC: 2.72 MIL/uL — ABNORMAL LOW (ref 4.22–5.81)
RDW: 20.5 % — ABNORMAL HIGH (ref 11.5–15.5)
WBC: 4.7 10*3/uL (ref 4.0–10.5)

## 2012-05-05 MED ORDER — LORAZEPAM 1 MG PO TABS: 1.0000 mg | ORAL_TABLET | Freq: Four times a day (QID) | ORAL | Status: AC | PRN

## 2012-05-05 MED ORDER — LORAZEPAM 2 MG/ML IJ SOLN
1.0000 mg | Freq: Four times a day (QID) | INTRAMUSCULAR | Status: AC | PRN
  Administered 2012-05-05 – 2012-05-08 (×8): 1 mg via INTRAVENOUS
  Filled 2012-05-05 (×7): qty 1

## 2012-05-05 NOTE — Progress Notes (Signed)
Subjective:  Patient feels better. His po intake is improving. No new complaint. Objective: Vital signs in last 24 hours: Temp:  [97.5 F (36.4 C)-98.8 F (37.1 C)] 97.6 F (36.4 C) (05/04 0400) Pulse Rate:  [40-116] 40 (05/03 1600) Resp:  [15-31] 18 (05/04 0500) BP: (91-134)/(54-88) 134/82 mmHg (05/04 0421) SpO2:  [82 %-100 %] 98 % (05/03 1940) Weight:  [51.8 kg (114 lb 3.2 oz)] 51.8 kg (114 lb 3.2 oz) (05/04 0500) Weight change: 2.5 kg (5 lb 8.2 oz) Last BM Date: 05/04/12  Intake/Output from previous day: 05/03 0701 - 05/04 0700 In: 4340 [P.O.:1440; I.V.:2750; IV Piggyback:150] Out: 1650 [Urine:1650]  PHYSICAL EXAM General appearance: alert, cachectic, fatigued and slowed mentation Resp: diminished breath sounds bilaterally and rhonchi bilaterally Cardio: S1, S2 normal GI: soft, non-tender; bowel sounds normal; no masses,  no organomegaly Extremities: extremities normal, atraumatic, no cyanosis or edema  Lab Results:    @labtest @ [facility] No results found for this basename: PHART, PCO2, PO2ART, TCO2, HCO3,  in the last 72 hours CULTURES Recent Results (from the past 240 hour(s))  CULTURE, BLOOD (ROUTINE X 2)     Status: None   Collection Time    05/02/12  3:09 PM      Result Value Range Status   Specimen Description BLOOD LEFT HAND   Final   Special Requests BOTTLES DRAWN AEROBIC AND ANAEROBIC 6CC   Final   Culture NO GROWTH 3 DAYS   Final   Report Status PENDING   Incomplete  CULTURE, BLOOD (ROUTINE X 2)     Status: None   Collection Time    05/02/12  3:15 PM      Result Value Range Status   Specimen Description BLOOD RIGHT ARM   Final   Special Requests     Final   Value: BOTTLES DRAWN AEROBIC AND ANAEROBIC AEB=12CC ANA=8CC   Culture NO GROWTH 3 DAYS   Final   Report Status PENDING   Incomplete  MRSA PCR SCREENING     Status: None   Collection Time    05/02/12  6:17 PM      Result Value Range Status   MRSA by PCR NEGATIVE  NEGATIVE Final   Comment:             The GeneXpert MRSA Assay (FDA     approved for NASAL specimens     only), is one component of a     comprehensive MRSA colonization     surveillance program. It is not     intended to diagnose MRSA     infection nor to guide or     monitor treatment for     MRSA infections.   Studies/Results: No results found.  Medications: I have reviewed the patient's current medications.  Assesment:  Profound hypoglycemia .  Suspected community-acquired pneumonia:  .Alcohol intoxication/abuse:     Chronic hip pain, wheelchair-bound  History of prostate cancer, hematuria Anemia  Plan: Continue to monitor blood sugar Nutritional support Continue IV antibiotics Watch for sign of alcohol withdrawal     LOS: 3 days   Vinayak Bobier 05/05/2012, 7:58 AM

## 2012-05-05 NOTE — Progress Notes (Signed)
Report called to Achilles Dunk, RN. Patient with some confusion but becoming clearer after moving patient out of ICU and removing leads and wires.  No acute distress noted.

## 2012-05-06 LAB — BASIC METABOLIC PANEL
BUN: 7 mg/dL (ref 6–23)
Calcium: 7.7 mg/dL — ABNORMAL LOW (ref 8.4–10.5)
Creatinine, Ser: 0.57 mg/dL (ref 0.50–1.35)
GFR calc non Af Amer: >90 mL/min (ref >90)
Glucose, Bld: 114 mg/dL — ABNORMAL HIGH (ref 70–99)

## 2012-05-06 LAB — CBC
HCT: 27.5 % — ABNORMAL LOW (ref 39.0–52.0)
Hemoglobin: 9.8 g/dL — ABNORMAL LOW (ref 13.0–17.0)
MCH: 34.8 pg — ABNORMAL HIGH (ref 26.0–34.0)
MCHC: 35.6 g/dL (ref 30.0–36.0)
RDW: 20.3 % — ABNORMAL HIGH (ref 11.5–15.5)

## 2012-05-06 MED ORDER — POTASSIUM CHLORIDE 10 MEQ/100ML IV SOLN
10.0000 meq | INTRAVENOUS | Status: AC
  Administered 2012-05-06 (×6): 10 meq via INTRAVENOUS
  Filled 2012-05-06 (×3): qty 100
  Filled 2012-05-06: qty 200
  Filled 2012-05-06: qty 100

## 2012-05-06 NOTE — Clinical Documentation Improvement (Signed)
MALNUTRITION DOCUMENTATION CLARIFICATION  THIS DOCUMENT IS NOT A PERMANENT PART OF THE MEDICAL RECORD  TO RESPOND TO THE THIS QUERY, FOLLOW THE INSTRUCTIONS BELOW:  1. If needed, update documentation for the patient's encounter via the notes activity.  2. Access this query again and click edit on the In [employer].  3. After updating, or not, click F2 to complete all highlighted (required) fields concerning your review. Select "additional documentation in the medical record" OR "no additional documentation provided".  4. Click Sign note button.  5. The deficiency will fall out of your In Basket *Please let us know if you are not able to complete this workflow by phone or e-mail (listed below).  Please update your documentation within the medical record to reflect your response to this query.                                                                                        05/06/12   Dear Dr.Fanta / Associates,  In a better effort to capture your patient's severity of illness, reflect appropriate length of stay and utilization of resources, a review of the patient medical record has revealed the following indicators.    Based on your clinical judgment, please clarify and document in a progress note and/or discharge summary the clinical condition associated with the following supporting information:  In responding to this query please exercise your independent judgment.  The fact that a query is asked, does not imply that any particular answer is desired or expected.  Please clarify nutritional status. Thank you.  Possible Clinical Conditions?  Mild Malnutrition  Moderate Malnutrition Severe Malnutrition   Protein Calorie Malnutrition Severe Protein Calorie Malnutrition Other Condition________________ Cannot clinically determine   Supporting Information: Risk Factors: History of moderate malnutrition which has worsened this admission History of alcohol abuse History of  tobacco abuse Altered mental status  Pneumonia  Signs & Symptoms: Ht 5'10"     Wt 111 lbs  BMI:  15.94  Weight  Loss 24lbs in past 11 months  Treatment  Clear Liquid Diet Feeding supplements : Pro-stat Sugar free 64 - 30ml & Resource breeze-1 container tid between meals Multivitamin with minerals daily  Nutrition Consult NUTRITION DIAGNOSIS:  Malnutriton related to inadequate oral intake as evidenced by ETOH abuse and 24# wt loss past 11 months.  Goal:  Pt to meet >/= 90% of their estimated nutrition needs  Monitor:  Diet advancement, Po intake, labs and wt trends  Reason for Assessment: Consult- poor po's Assessed by RD on 06/09/11. Pt wt at that time 135#. At this time his UBW-145#. He was given diagnosis moderate malnutrition at that time in the context of social and environmental circumstances due to moderate depletion of body fat and muscle mass which is ongoing..  Today he tells me that over past year his wt has "fallen off". His wt has continued to decrease an additional 24#, 18% in past 11 months He continues to live alone and prepares his own food except occasionally when a friend and his wife come by to check in on him.Reports poor appetite. Poor dentition but says that he follows a regular diet  at home and is not a "picky" eater.   You may use possible, probable, or suspect with inpatient documentation. possible, probable, suspected diagnoses MUST be documented at the time of discharge  Reviewed: No response Thank You,  Harless Litten RN, MSN Clinical Documentation Specialist: Office# [phone] [facility] [address]

## 2012-05-06 NOTE — Progress Notes (Signed)
Subjective:  Patient is more confused and agitated. His K+ very low and his developing signs of alcohol withdrawal. Objective: Vital signs in last 24 hours: Temp:  [97.3 F (36.3 C)-97.9 F (36.6 C)] 97.3 F (36.3 C) (05/05 0427) Pulse Rate:  [80-113] 111 (05/05 0427) Resp:  [18] 18 (05/04 2032) BP: (119-135)/(59-85) 135/85 mmHg (05/05 0427) SpO2:  [97 %-100 %] 100 % (05/05 0427) Weight change:  Last BM Date: 05/05/12  Intake/Output from previous day: 05/04 0701 - 05/05 0700 In: 245 [P.O.:120; I.V.:125] Out: 1575 [Urine:1575]  PHYSICAL EXAM General appearance: alert, cachectic, fatigued and slowed mentation Resp: diminished breath sounds bilaterally and rhonchi bilaterally Cardio: S1, S2 normal GI: soft, non-tender; bowel sounds normal; no masses,  no organomegaly Extremities: extremities normal, atraumatic, no cyanosis or edema  Lab Results:    @labtest @ [facility] No results found for this basename: PHART, PCO2, PO2ART, TCO2, HCO3,  in the last 72 hours CULTURES Recent Results (from the past 240 hour(s))  CULTURE, BLOOD (ROUTINE X 2)     Status: None   Collection Time    05/02/12  3:09 PM      Result Value Range Status   Specimen Description BLOOD LEFT HAND   Final   Special Requests BOTTLES DRAWN AEROBIC AND ANAEROBIC 6CC   Final   Culture NO GROWTH 3 DAYS   Final   Report Status PENDING   Incomplete  CULTURE, BLOOD (ROUTINE X 2)     Status: None   Collection Time    05/02/12  3:15 PM      Result Value Range Status   Specimen Description BLOOD RIGHT ARM   Final   Special Requests     Final   Value: BOTTLES DRAWN AEROBIC AND ANAEROBIC AEB=12CC ANA=8CC   Culture NO GROWTH 3 DAYS   Final   Report Status PENDING   Incomplete  MRSA PCR SCREENING     Status: None   Collection Time    05/02/12  6:17 PM      Result Value Range Status   MRSA by PCR NEGATIVE  NEGATIVE Final   Comment:            The GeneXpert MRSA Assay (FDA     approved for NASAL specimens     only),  is one component of a     comprehensive MRSA colonization     surveillance program. It is not     intended to diagnose MRSA     infection nor to guide or     monitor treatment for     MRSA infections.   Studies/Results: No results found.  Medications: I have reviewed the patient's current medications.  Assesment:  Profound hypoglycemia .  Suspected community-acquired pneumonia:  .Alcohol intoxication/abuse:     Chronic hip pain, wheelchair-bound  History of prostate cancer, hematuria Anemia Alcohol withdrawal Syndrome  Hypokalemia  Plan: Continue to monitor blood sugar Nutritional support Continue IV antibiotics K+ supplement BMP in Am    LOS: 4 days   Kysha Muralles 05/06/2012, 7:45 AM

## 2012-05-06 NOTE — Progress Notes (Signed)
CRITICAL VALUE ALERT  Critical value received:  potassium  Date of notification:  05-06-12  Time of notification:  0615  Critical value read back: yes  Nurse who received alert: b Milana Obey   MD notified (1st page): fanta  Time of first page: 0618  MD notified (2nd page):  Time of second page:  Responding MD:  fanta  Dr. [name] returned call.    Time MD responded:  [phone]

## 2012-05-07 LAB — CBC
HCT: 27.8 % — ABNORMAL LOW (ref 39.0–52.0)
MCV: 96.5 fL (ref 78.0–100.0)
RBC: 2.88 MIL/uL — ABNORMAL LOW (ref 4.22–5.81)
RDW: 20.5 % — ABNORMAL HIGH (ref 11.5–15.5)
WBC: 5.6 10*3/uL (ref 4.0–10.5)

## 2012-05-07 LAB — CULTURE, BLOOD (ROUTINE X 2)

## 2012-05-07 LAB — GLUCOSE, CAPILLARY
Glucose-Capillary: 95 mg/dL (ref 70–99)
Glucose-Capillary: 112 mg/dL — ABNORMAL HIGH (ref 70–99)
Glucose-Capillary: 117 mg/dL — ABNORMAL HIGH (ref 70–99)

## 2012-05-07 LAB — BASIC METABOLIC PANEL
BUN: 10 mg/dL (ref 6–23)
CO2: 29 mEq/L (ref 19–32)
Chloride: 95 mEq/L — ABNORMAL LOW (ref 96–112)
Creatinine, Ser: 0.58 mg/dL (ref 0.50–1.35)

## 2012-05-07 MED ORDER — POTASSIUM CHLORIDE 10 MEQ/100ML IV SOLN
10.0000 meq | INTRAVENOUS | Status: AC
  Administered 2012-05-07 (×2): 10 meq via INTRAVENOUS

## 2012-05-07 MED ORDER — POTASSIUM CHLORIDE 10 MEQ/100ML IV SOLN
INTRAVENOUS | Status: AC
  Administered 2012-05-07: 10 meq via INTRAVENOUS
  Filled 2012-05-07: qty 300

## 2012-05-07 NOTE — Progress Notes (Signed)
Subjective:  Patient remained confused and disoriented. He is treatment for alcohol withdrawal. . Objective: Vital signs in last 24 hours: Temp:  [98.1 F (36.7 C)-98.7 F (37.1 C)] 98.6 F (37 C) (05/06 0602) Pulse Rate:  [90-145] 103 (05/06 0549) Resp:  [18-20] 20 (05/06 0602) BP: (101-123)/(66-85) 101/66 mmHg (05/06 0549) SpO2:  [92 %-99 %] 99 % (05/06 0602) Weight change:  Last BM Date: 05/05/12  Intake/Output from previous day: 05/05 0701 - 05/06 0700 In: 1730.8 [P.O.:50; I.V.:1030.8; IV Piggyback:650] Out: 301 [Urine:300; Stool:1]  PHYSICAL EXAM General appearance: alert, cachectic, fatigued and slowed mentation Resp: diminished breath sounds bilaterally and rhonchi bilaterally Cardio: S1, S2 normal GI: soft, non-tender; bowel sounds normal; no masses,  no organomegaly Extremities: extremities normal, atraumatic, no cyanosis or edema  Lab Results:    @labtest @ [facility] No results found for this basename: PHART, PCO2, PO2ART, TCO2, HCO3,  in the last 72 hours CULTURES Recent Results (from the past 240 hour(s))  CULTURE, BLOOD (ROUTINE X 2)     Status: None   Collection Time    05/02/12  3:09 PM      Result Value Range Status   Specimen Description BLOOD LEFT HAND   Final   Special Requests BOTTLES DRAWN AEROBIC AND ANAEROBIC 6CC   Final   Culture NO GROWTH 4 DAYS   Final   Report Status PENDING   Incomplete  CULTURE, BLOOD (ROUTINE X 2)     Status: None   Collection Time    05/02/12  3:15 PM      Result Value Range Status   Specimen Description BLOOD RIGHT ARM   Final   Special Requests     Final   Value: BOTTLES DRAWN AEROBIC AND ANAEROBIC AEB=12CC ANA=8CC   Culture NO GROWTH 4 DAYS   Final   Report Status PENDING   Incomplete  MRSA PCR SCREENING     Status: None   Collection Time    05/02/12  6:17 PM      Result Value Range Status   MRSA by PCR NEGATIVE  NEGATIVE Final   Comment:            The GeneXpert MRSA Assay (FDA     approved for NASAL specimens      only), is one component of a     comprehensive MRSA colonization     surveillance program. It is not     intended to diagnose MRSA     infection nor to guide or     monitor treatment for     MRSA infections.   Studies/Results: No results found.  Medications: I have reviewed the patient's current medications.  Assesment:  Profound hypoglycemia .  Suspected community-acquired pneumonia:  .Alcohol intoxication/abuse:     Chronic hip pain, wheelchair-bound  History of prostate cancer, hematuria Anemia Alcohol withdrawal Syndrome  Hypokalemia  Plan: Continue to monitor blood sugar Nutritional support Continue IV antibiotics K+ supplement BMP in Am    LOS: 5 days   Avaline Stillson 05/07/2012, 7:54 AM

## 2012-05-07 NOTE — Progress Notes (Signed)
PT Cancellation Note  Patient Details Name: Jermaine [name] MRN: [id number] DOB: 1931/05/02   Cancelled Treatment:    Reason Eval/Treat Not Completed: Fatigue/lethargy limiting ability to participate Pt is extremely drowsy and unable to awaken enough in order to participate in any PT.  Will try again tomorrow.  Konrad Penta 05/07/2012, 4:02 PM

## 2012-05-07 NOTE — Progress Notes (Addendum)
NUTRITION FOLLOW UP  Intervention:   Continue with nutrition POC.  Will follow for diet advancement (day 5 clear liquids). Consider enteral nutrition support if diet is unable to be advanced and/or pt is unable to meet energy needs PO.    Nutrition Dx:   Malnutriton related to inadequate oral intake as evidenced by ETOH abuse and 24# wt loss past 11 months.   Goal:   Pt to meet >/= 90% of their estimated nutrition needs- goal not met  Monitor:   Diet advancement, Po intake, labs and wt trends  Assessment:   Follow-up with pt due to low braden. PO intake remains poor, with decline (PO: 0-100%, with decline). Pt remains on a clear liquid diet (day 5).  Noted 3# (2.7%) wt gain x 5 days. Wt gain significant, but desirable due to hx of wt loss.  Pt experiencing alcohol withdrawal and confusion due to low K. Pt currently on on K supplementation and IV antibiotics.  Pt continues to receive Resource Breeze TID and 30 ml Prostat TID.   Height: Ht Readings from Last 1 Encounters:  05/02/12 5\' 10"  (1.778 m)    Weight Status:   Wt Readings from Last 1 Encounters:  05/05/12 114 lb 3.2 oz (51.8 kg)    Re-estimated needs:  Kcal: 2956-2130 daily Protein: 65-78 grams daily Fluid: 1.2-1.3 L daily  Skin: stage II pressure ulcer on groin  Diet Order: Clear Liquid   Intake/Output Summary (Last 24 hours) at 05/07/12 1237 Last data filed at 05/07/12 1034  Gross per 24 hour  Intake 1130.83 ml  Output    301 ml  Net 829.83 ml    Last BM: 05/05/12   Labs:   Recent Labs Lab 05/03/12 0427 05/06/12 0523 05/07/12 0518  NA 130* 133* 130*  K 3.0* 2.5* 3.1*  CL 94* 96 95*  CO2 25 30 29   BUN 10 7 10   CREATININE 0.60 0.57 0.58  CALCIUM 7.2* 7.7* 7.6*  GLUCOSE 212* 114* 101*    CBG (last 3)   Recent Labs  05/06/12 2155 05/07/12 0727 05/07/12 1132  GLUCAP 112* 95 112*    Scheduled Meds: . enoxaparin (LOVENOX) injection  30 mg Subcutaneous Q24H  . feeding supplement  30 mL  Oral TID WC  . feeding supplement  1 Container Oral TID BM  . folic acid  1 mg Oral Daily  . multivitamin with minerals  1 tablet Oral Daily  . nicotine  14 mg Transdermal Daily  . sodium chloride  3 mL Intravenous Q12H  . thiamine  100 mg Oral Daily   Or  . thiamine  100 mg Intravenous Daily    Continuous Infusions: . dextrose 5 % and 0.45% NaCl 50 mL/hr at 05/06/12 2016    Melody Haver, RD, LDN Pager: [phone]

## 2012-05-07 NOTE — Care Management Note (Signed)
    Page 1 of 1   05/10/2012     3:14:13 PM   CARE MANAGEMENT NOTE 05/10/2012  Patient:  Jermaine [name], Jermaine [name]   Account Number:  [id number]  Date Initiated:  05/07/2012  Documentation initiated by:  Rosemary Holms  Subjective/Objective Assessment:   Pt admitted from home where he lives alone. Pt currently confused and appear to be going through withdrawals. CM spoke to nephew, Maurine Minister. He states that his son is his next of kin and they do not get along. Baseline per Maurine Minister is walking     Action/Plan:   and independent. When we discussed disposition, he inquired about placement "across the street" CM stated she would have CSW look into that and contact him.   Anticipated DC Date:  05/10/2012   Anticipated DC Plan:  SKILLED NURSING FACILITY  In-house referral  Clinical Social Worker      DC Planning Services  CM consult      Choice offered to / List presented to:             Status of service:  Completed, signed off Medicare Important Message given?   (If response is "NO", the following Medicare IM given date fields will be blank) Date Medicare IM given:   Date Additional Medicare IM given:    Discharge Disposition:    Per UR Regulation:    If discussed at Long Length of Stay Meetings, dates discussed:    Comments:  05/09/12 Rosemary Holms RN BSN CM Anticipate DC to SNF tomorrow  05/07/12 Rosemary Holms RN BSN CM

## 2012-05-07 NOTE — Progress Notes (Signed)
UR Chart Review Completed  

## 2012-05-08 LAB — GLUCOSE, CAPILLARY
Glucose-Capillary: 96 mg/dL (ref 70–99)
Glucose-Capillary: 106 mg/dL — ABNORMAL HIGH (ref 70–99)
Glucose-Capillary: 108 mg/dL — ABNORMAL HIGH (ref 70–99)
Glucose-Capillary: 110 mg/dL — ABNORMAL HIGH (ref 70–99)

## 2012-05-08 LAB — BASIC METABOLIC PANEL WITH GFR
BUN: 12 mg/dL (ref 6–23)
CO2: 27 meq/L (ref 19–32)
Calcium: 7.6 mg/dL — ABNORMAL LOW (ref 8.4–10.5)
Chloride: 97 meq/L (ref 96–112)
Creatinine, Ser: 0.58 mg/dL (ref 0.50–1.35)
GFR calc Af Amer: >90 mL/min
GFR calc non Af Amer: >90 mL/min
Glucose, Bld: 92 mg/dL (ref 70–99)
Potassium: 2.9 meq/L — ABNORMAL LOW (ref 3.5–5.1)
Sodium: 133 meq/L — ABNORMAL LOW (ref 135–145)

## 2012-05-08 LAB — CBC
Platelets: 106 10*3/uL — ABNORMAL LOW (ref 150–400)
RBC: 2.67 MIL/uL — ABNORMAL LOW (ref 4.22–5.81)
WBC: 4.6 10*3/uL (ref 4.0–10.5)

## 2012-05-08 LAB — MAGNESIUM: Magnesium: 1.1 mg/dL — ABNORMAL LOW (ref 1.5–2.5)

## 2012-05-08 MED ORDER — POTASSIUM CHLORIDE 10 MEQ/100ML IV SOLN
INTRAVENOUS | Status: AC
  Administered 2012-05-08: 10 meq
  Filled 2012-05-08: qty 100

## 2012-05-08 MED ORDER — POTASSIUM CHLORIDE 10 MEQ/100ML IV SOLN
INTRAVENOUS | Status: AC
  Filled 2012-05-08: qty 100

## 2012-05-08 MED ORDER — POTASSIUM CHLORIDE 10 MEQ/100ML IV SOLN
10.0000 meq | INTRAVENOUS | Status: AC
Start: 2012-05-08 — End: 2012-05-08
  Administered 2012-05-08 (×3): 10 meq via INTRAVENOUS
  Filled 2012-05-08 (×2): qty 100

## 2012-05-08 NOTE — Clinical Social Work Psychosocial (Signed)
Clinical Social Work Department BRIEF PSYCHOSOCIAL ASSESSMENT 05/08/2012  Patient:  Jermaine [name], Jermaine [name]     Account Number:  [id number]     Admit date:  05/02/2012  Clinical Social Worker:  Nancie Neas  Date/Time:  05/08/2012 03:20 PM  Referred by:  Care Management  Date Referred:  05/08/2012 Referred for  SNF Placement   Other Referral:   Interview type:  Patient Other interview type:   left voicemail for pt's nephew Maurine Minister    PSYCHOSOCIAL DATA Living Status:  ALONE Admitted from facility:   Level of care:   Primary support name:  Maurine Minister Primary support relationship to patient:  FAMILY Degree of support available:   uncertain    CURRENT CONCERNS Current Concerns  Post-Acute Placement   Other Concerns:    SOCIAL WORK ASSESSMENT / PLAN CSW met with pt at bedside twice today. This morning, pt was sleeping soundly. This afternoon, pt was more alert and oriented. However, his speech is very hard to understand. He said he was in the hospital and knew it was May 2014. Pt named several family members and how they were related. CSW and RN asked pt about living situation. He lives alone and per PT note is wheelchair dependent. Pt stated during PT eval that he transfer without difficulty, yet refused to perform this. Pt admitted to drinking 1/2 pint of alcohol daily and said that "different people" supply this for him. Pt has been in DTs in hospital. His mental status appears to be improving now compared to earlier reports. Pt followed some commands. After initial PT eval, home health was recommended and it was felt he was ALF appropriate. Per staff, pt requiring additional assist now and may require SNF. CSW called Ed, who is listed as pt's son but reports he is related, but not a son. CSW called Maurine Minister who pt mentioned and left voicemail requesting return call. CSW will initiate bed search in anticipation of SNF need and follow up with pt/family tomorrow.   Assessment/plan status:   Psychosocial Support/Ongoing Assessment of Needs Other assessment/ plan:   Information/referral to community resources:   SNF list    PATIENT'S/FAMILY'S RESPONSE TO PLAN OF CARE: Pt more oriented now but very difficult to understand. CSW asked pt about going to rehab and he agreed. However, several times during assessment pt requested a wheelchair and pleaded to let him go home.       Derenda Fennel, [place] [phone]

## 2012-05-08 NOTE — Progress Notes (Signed)
Subjective: Patient remained confused and disoriented. His breathing is improved. No fever or chills.. Objective: Vital signs in last 24 hours: Temp:  [97.3 F (36.3 C)-99.6 F (37.6 C)] 97.3 F (36.3 C) (05/07 0538) Pulse Rate:  [109-115] 109 (05/07 0538) Resp:  [20] 20 (05/07 0538) BP: (110-122)/(71-87) 113/87 mmHg (05/07 0538) SpO2:  [98 %-100 %] 98 % (05/07 0538) Weight change:  Last BM Date: 05/07/12  Intake/Output from previous day: 05/06 0701 - 05/07 0700 In: 737.5 [I.V.:737.5] Out: 751 [Urine:750; Stool:1]  PHYSICAL EXAM General appearance: alert, cachectic, fatigued and slowed mentation Resp: diminished breath sounds bilaterally and rhonchi bilaterally Cardio: S1, S2 normal GI: soft, non-tender; bowel sounds normal; no masses,  no organomegaly Extremities: extremities normal, atraumatic, no cyanosis or edema  Lab Results:    @labtest @ [facility] No results found for this basename: PHART, PCO2, PO2ART, TCO2, HCO3,  in the last 72 hours CULTURES Recent Results (from the past 240 hour(s))  CULTURE, BLOOD (ROUTINE X 2)     Status: None   Collection Time    05/02/12  3:09 PM      Result Value Range Status   Specimen Description BLOOD LEFT HAND   Final   Special Requests BOTTLES DRAWN AEROBIC AND ANAEROBIC 6CC   Final   Culture NO GROWTH 5 DAYS   Final   Report Status 05/07/2012 FINAL   Final  CULTURE, BLOOD (ROUTINE X 2)     Status: None   Collection Time    05/02/12  3:15 PM      Result Value Range Status   Specimen Description BLOOD RIGHT ARM   Final   Special Requests     Final   Value: BOTTLES DRAWN AEROBIC AND ANAEROBIC AEB=12CC ANA=8CC   Culture NO GROWTH 5 DAYS   Final   Report Status 05/07/2012 FINAL   Final  MRSA PCR SCREENING     Status: None   Collection Time    05/02/12  6:17 PM      Result Value Range Status   MRSA by PCR NEGATIVE  NEGATIVE Final   Comment:            The GeneXpert MRSA Assay (FDA     approved for NASAL specimens     only), is  one component of a     comprehensive MRSA colonization     surveillance program. It is not     intended to diagnose MRSA     infection nor to guide or     monitor treatment for     MRSA infections.   Studies/Results: No results found.  Medications: I have reviewed the patient's current medications.  Assesment:  Profound hypoglycemia .  Suspected community-acquired pneumonia:  .Alcohol intoxication/abuse:     Chronic hip pain, wheelchair-bound  History of prostate cancer, hematuria Anemia Alcohol withdrawal Syndrome  Hypokalemia  Plan: Continue to monitor blood sugar Nutritional support Continue IV antibiotics K+ supplement BMP in Am MG++ level    LOS: 6 days   Avamarie Crossley 05/08/2012, 8:21 AM

## 2012-05-08 NOTE — Clinical Social Work Placement (Signed)
Clinical Social Work Department CLINICAL SOCIAL WORK PLACEMENT NOTE 05/08/2012  Patient:  Jermaine [name], Jermaine [name]  Account Number:  [id number] Admit date:  05/02/2012  Clinical Social Worker:  Derenda Fennel, LCSW  Date/time:  05/08/2012 03:15 PM  Clinical Social Work is seeking post-discharge placement for this patient at the following level of care:   SKILLED NURSING   (*CSW will update this form in Epic as items are completed)   05/08/2012  Patient/family provided with [facility] Department of Clinical Social Work's list of facilities offering this level of care within the geographic area requested by the patient (or if unable, by the patient's family).  05/08/2012  Patient/family informed of their freedom to choose among providers that offer the needed level of care, that participate in Medicare, Medicaid or managed care program needed by the patient, have an available bed and are willing to accept the patient.  05/08/2012  Patient/family informed of [facility]' ownership interest in [facility], as well as of the fact that they are under no obligation to receive care at this facility.  PASARR submitted to EDS on  PASARR number received from EDS on   FL2 transmitted to all facilities in geographic area requested by pt/family on  05/08/2012 FL2 transmitted to all facilities within larger geographic area on   Patient informed that his/her managed care company has contracts with or will negotiate with  certain facilities, including the following:     Patient/family informed of bed offers received:   Patient chooses bed at  Physician recommends and patient chooses bed at    Patient to be transferred to  on   Patient to be transferred to facility by   The following physician request were entered in Epic:   Additional Comments: pt has existing pasarr number   Derenda Fennel, LCSW [phone]

## 2012-05-09 LAB — BASIC METABOLIC PANEL
BUN: 9 mg/dL (ref 6–23)
CO2: 24 mEq/L (ref 19–32)
Calcium: 7.7 mg/dL — ABNORMAL LOW (ref 8.4–10.5)
Chloride: 96 mEq/L (ref 96–112)
Creatinine, Ser: 0.52 mg/dL (ref 0.50–1.35)
GFR calc Af Amer: >90 mL/min (ref >90)
GFR calc non Af Amer: >90 mL/min (ref >90)
Glucose, Bld: 82 mg/dL (ref 70–99)
Potassium: 3 mEq/L — ABNORMAL LOW (ref 3.5–5.1)
Sodium: 130 mEq/L — ABNORMAL LOW (ref 135–145)

## 2012-05-09 LAB — CBC
HCT: 24.3 % — ABNORMAL LOW (ref 39.0–52.0)
Hemoglobin: 8.9 g/dL — ABNORMAL LOW (ref 13.0–17.0)
MCH: 34.6 pg — ABNORMAL HIGH (ref 26.0–34.0)
MCHC: 36.6 g/dL — ABNORMAL HIGH (ref 30.0–36.0)
MCV: 94.6 fL (ref 78.0–100.0)
Platelets: 127 K/uL — ABNORMAL LOW (ref 150–400)
RBC: 2.57 MIL/uL — ABNORMAL LOW (ref 4.22–5.81)
RDW: 19.9 % — ABNORMAL HIGH (ref 11.5–15.5)
WBC: 4.4 K/uL (ref 4.0–10.5)

## 2012-05-09 LAB — GLUCOSE, CAPILLARY

## 2012-05-09 MED ORDER — MAGNESIUM OXIDE 400 (241.3 MG) MG PO TABS
400.0000 mg | ORAL_TABLET | Freq: Two times a day (BID) | ORAL | Status: DC
  Administered 2012-05-09: 400 mg via ORAL
  Filled 2012-05-09 (×3): qty 1

## 2012-05-09 MED ORDER — MAGNESIUM SULFATE 40 MG/ML IJ SOLN
2.0000 g | Freq: Once | INTRAMUSCULAR | Status: AC
  Administered 2012-05-09: 2 g via INTRAVENOUS
  Filled 2012-05-09: qty 50

## 2012-05-09 MED ORDER — POTASSIUM CHLORIDE 10 MEQ/100ML IV SOLN
INTRAVENOUS | Status: AC
  Administered 2012-05-09: 10 meq
  Filled 2012-05-09: qty 100

## 2012-05-09 MED ORDER — POTASSIUM CHLORIDE 10 MEQ/100ML IV SOLN
10.0000 meq | INTRAVENOUS | Status: AC
  Administered 2012-05-09 (×3): 10 meq via INTRAVENOUS
  Filled 2012-05-09 (×3): qty 100

## 2012-05-09 NOTE — Progress Notes (Signed)
Subjective: Patient is more alert today and he is responding to verbal communication. His po intake is improving. He is planned for nursing home placement. Objective: Vital signs in last 24 hours: Temp:  [97.5 F (36.4 C)-97.8 F (36.6 C)] 97.5 F (36.4 C) (05/08 0628) Pulse Rate:  [90-110] 90 (05/08 0628) Resp:  [20] 20 (05/08 0628) BP: (116-135)/(72-92) 116/72 mmHg (05/08 0628) SpO2:  [95 %-100 %] 100 % (05/08 0628) Weight change:  Last BM Date: 05/08/12  Intake/Output from previous day: 05/07 0701 - 05/08 0700 In: -  Out: 400 [Urine:400]  PHYSICAL EXAM General appearance: alert, cachectic, fatigued and slowed mentation Resp: diminished breath sounds bilaterally and rhonchi bilaterally Cardio: S1, S2 normal GI: soft, non-tender; bowel sounds normal; no masses,  no organomegaly Extremities: extremities normal, atraumatic, no cyanosis or edema  Lab Results:    @labtest @ [facility] No results found for this basename: PHART, PCO2, PO2ART, TCO2, HCO3,  in the last 72 hours CULTURES Recent Results (from the past 240 hour(s))  CULTURE, BLOOD (ROUTINE X 2)     Status: None   Collection Time    05/02/12  3:09 PM      Result Value Range Status   Specimen Description BLOOD LEFT HAND   Final   Special Requests BOTTLES DRAWN AEROBIC AND ANAEROBIC 6CC   Final   Culture NO GROWTH 5 DAYS   Final   Report Status 05/07/2012 FINAL   Final  CULTURE, BLOOD (ROUTINE X 2)     Status: None   Collection Time    05/02/12  3:15 PM      Result Value Range Status   Specimen Description BLOOD RIGHT ARM   Final   Special Requests     Final   Value: BOTTLES DRAWN AEROBIC AND ANAEROBIC AEB=12CC ANA=8CC   Culture NO GROWTH 5 DAYS   Final   Report Status 05/07/2012 FINAL   Final  MRSA PCR SCREENING     Status: None   Collection Time    05/02/12  6:17 PM      Result Value Range Status   MRSA by PCR NEGATIVE  NEGATIVE Final   Comment:            The GeneXpert MRSA Assay (FDA     approved for NASAL  specimens     only), is one component of a     comprehensive MRSA colonization     surveillance program. It is not     intended to diagnose MRSA     infection nor to guide or     monitor treatment for     MRSA infections.   Studies/Results: No results found.  Medications: I have reviewed the patient's current medications.  Assesment:  Profound hypoglycemia .  Suspected community-acquired pneumonia:  .Alcohol intoxication/abuse:     Chronic hip pain, wheelchair-bound  History of prostate cancer, hematuria Anemia Alcohol withdrawal Syndrome  Hypokalemia  Plan: Continue to monitor blood sugar Nutritional support Continue IV antibiotics K+ supplement MG++ supplement  LOS: 7 days   Chiquitta Matty 05/09/2012, 7:59 AM

## 2012-05-09 NOTE — Progress Notes (Signed)
Physical Therapy Treatment Patient Details Name: Jermaine [name] MRN: [id number] DOB: February 01, 1931 Today's Date: 05/09/2012 Time: 8295-6213 PT Time Calculation (min): 12 min  PT Assessment / Plan / Recommendation Comments on Treatment Session  Pt was up in the chair and wanted to get back to bed.  He was therefore willing to have me work with him on a transfer chair to bed.  He was unable to participate in any part of the transfer and could not bear any weight on his LEs due to severe flexion contractures.  In my opinion,, he has no rehab potential and needs to be in a facility that can provide total assist.    Follow Up Recommendations  No PT follow up     Does the patient have the potential to tolerate intense rehabilitation     Barriers to Discharge        Equipment Recommendations  None recommended by PT    Recommendations for Other Services    Frequency Other (Comment) (d/c PT due to no rehab potential)   Plan Discharge plan needs to be updated;Frequency needs to be updated    Precautions / Restrictions     Pertinent Vitals/Pain     Mobility  Transfers Transfers: Squat Pivot Transfers Squat Pivot Transfers: 1: +1 Total assist Details for Transfer Assistance: pt was unable to assist with transfer in any way.  His hips and knees are so contracted that it is impossible for him to functionally bear weight on the LEs Ambulation/Gait Ambulation/Gait Assistance: Not tested (comment)    Exercises     PT Diagnosis:    PT Problem List:   PT Treatment Interventions:     PT Goals Acute Rehab PT Goals PT Transfer Goal: Bed to Chair/Chair to Bed - Progress: Discontinued (comment) (needs total assist for transfer)  Visit Information  Last PT Received On: 05/09/12    Subjective Data  Subjective: unable to understand any speech   Cognition       Balance     End of Session PT - End of Session Equipment Utilized During Treatment: Gait belt Activity Tolerance: Patient  tolerated treatment well Patient left: in bed;with call bell/phone within reach;with bed alarm set Nurse Communication: Mobility status   GP     Konrad Penta 05/09/2012, 3:57 PM

## 2012-05-09 NOTE — Clinical Social Work Note (Addendum)
CSW spoke with Ed who is listed on pt's chart as son, but is really a good friend who has known pt for about 15 years. CSW left voicemails for pt's cousin and nephew but no return call. Ed reports he has stayed with pt in the past and helped take care of him at times when needed. He was currently checking on pt twice daily at his home. He reports neighbors were helping pt out and cooking for him. CSW explained d/c plan and Ed agrees pt would benefit from SNF. He said that pt has been to [facility] in the past for rehab and accepts bed there. Ed is willing to sign paperwork at facility if no family is contacted. CSW notified [facility] and they are aware of pt's condition and that he has been unable to work with PT for several days.  CSW paged MD to notify him of available bed, but have not received call yet from him.   Derenda Fennel, [place] [phone]

## 2012-05-09 NOTE — Clinical Social Work Placement (Signed)
Clinical Social Work Department CLINICAL SOCIAL WORK PLACEMENT NOTE 05/09/2012  Patient:  Jermaine [name], Jermaine [name]  Account Number:  [id number] Admit date:  05/02/2012  Clinical Social Worker:  Derenda Fennel, LCSW  Date/time:  05/08/2012 03:15 PM  Clinical Social Work is seeking post-discharge placement for this patient at the following level of care:   SKILLED NURSING   (*CSW will update this form in Epic as items are completed)   05/08/2012  Patient/family provided with [facility] Department of Clinical Social Work's list of facilities offering this level of care within the geographic area requested by the patient (or if unable, by the patient's family).  05/08/2012  Patient/family informed of their freedom to choose among providers that offer the needed level of care, that participate in Medicare, Medicaid or managed care program needed by the patient, have an available bed and are willing to accept the patient.  05/08/2012  Patient/family informed of [facility]' ownership interest in [facility], as well as of the fact that they are under no obligation to receive care at this facility.  PASARR submitted to EDS on  PASARR number received from EDS on   FL2 transmitted to all facilities in geographic area requested by pt/family on  05/08/2012 FL2 transmitted to all facilities within larger geographic area on   Patient informed that his/her managed care company has contracts with or will negotiate with  certain facilities, including the following:     Patient/family informed of bed offers received:  05/09/2012 Patient chooses bed at [facility] [place] Physician recommends and patient chooses bed at  [facility] [place]  Patient to be transferred to  on   Patient to be transferred to facility by   The following physician request were entered in Epic:   Additional Comments: pt has existing pasarr number  Derenda Fennel, LCSW [phone]

## 2012-05-09 NOTE — Progress Notes (Signed)
Utilization Review Complete  

## 2012-05-10 LAB — BASIC METABOLIC PANEL
BUN: 7 mg/dL (ref 6–23)
Calcium: 7.9 mg/dL — ABNORMAL LOW (ref 8.4–10.5)
Creatinine, Ser: 0.59 mg/dL (ref 0.50–1.35)
GFR calc non Af Amer: >90 mL/min (ref >90)
Glucose, Bld: 94 mg/dL (ref 70–99)

## 2012-05-10 LAB — GLUCOSE, CAPILLARY

## 2012-05-10 LAB — CBC
HCT: 23.6 % — ABNORMAL LOW (ref 39.0–52.0)
Hemoglobin: 8.7 g/dL — ABNORMAL LOW (ref 13.0–17.0)
MCH: 34.8 pg — ABNORMAL HIGH (ref 26.0–34.0)
MCHC: 36.9 g/dL — ABNORMAL HIGH (ref 30.0–36.0)
MCV: 94.4 fL (ref 78.0–100.0)

## 2012-05-10 MED ORDER — ADULT MULTIVITAMIN W/MINERALS CH: 1.0000 | ORAL_TABLET | Freq: Every day | ORAL | Status: DC

## 2012-05-10 MED ORDER — FOLIC ACID 1 MG PO TABS: 1.0000 mg | ORAL_TABLET | Freq: Every day | ORAL | Status: DC

## 2012-05-10 MED ORDER — MAGNESIUM OXIDE 400 (241.3 MG) MG PO TABS: 400.0000 mg | ORAL_TABLET | Freq: Two times a day (BID) | ORAL | Status: DC

## 2012-05-10 NOTE — Progress Notes (Signed)
Patient with orders to be discharge to [facility]. Report called to April, nurse. Patient in stable condition upon discharge. Patient left via EMS.  

## 2012-05-10 NOTE — Discharge Summary (Signed)
Physician Discharge Summary  Patient ID: Jermaine [name] MRN: [id number] DOB/AGE: [birth year] 79 y.o. Primary Care Physician:Tylea Hise, MD Admit date: 05/02/2012 Discharge date: 05/10/2012    Discharge Diagnoses:   Principal Problem:   Hypoglycemia Active Problems:   Alcoholism /alcohol abuse   Alcohol intoxication   Tobacco abuse   CAP (community acquired pneumonia) Anemia of chronic disease Alcohol withdrawal Syndrome  Hypokalemia    Medication List    TAKE these medications       albuterol (2.5 MG/3ML) 0.083% nebulizer solution  Commonly known as:  PROVENTIL  Take 2.5 mg by nebulization every 6 (six) hours as needed. For shortness of breath     ASPIRIN EC PO  Take 1 tablet by mouth as needed.     baclofen 10 MG tablet  Commonly known as:  LIORESAL  Take 10 mg by mouth 2 (two) times daily.     folic acid 1 MG tablet  Commonly known as:  FOLVITE  Take 1 tablet (1 mg total) by mouth daily.     magnesium oxide 400 (241.3 MG) MG tablet  Commonly known as:  MAG-OX  Take 1 tablet (400 mg total) by mouth 2 (two) times daily.     multivitamin with minerals Tabs  Take 1 tablet by mouth daily.        Discharged Condition: improved    Consults: none  Significant Diagnostic Studies: Dg Chest Port 1 View  05/02/2012  *RADIOLOGY REPORT*  Clinical Data: Hypoglycemia  PORTABLE CHEST - 1 VIEW  Comparison: 09/26/2010  Findings: Cardiomediastinal silhouette is stable.  Mild hyperinflation.  No pulmonary edema.  Streaky left base medially atelectasis or infiltrate.  IMPRESSION: Mild hyperinflation.  No pulmonary edema.  Streaky left base medially atelectasis or infiltrate.   Original Report Authenticated By: Natasha Mead, M.D.    Dg Abd 2 Views  05/02/2012  *RADIOLOGY REPORT*  Clinical Data: Nausea, vomiting, abdominal pain  ABDOMEN - 2 VIEW  Comparison: 06/08/2011  Findings: Nonspecific bowel gas pattern without disproportionate small bowel dilatation to suggest small bowel  obstruction.  No evidence of free air under the diaphragm on the upright view.  Degenerative changes of the visualized thoracolumbar spine. Vascular calcifications.  Status post ORIF of the left hip.  IMPRESSION: No evidence of small bowel obstruction or free air.   Original Report Authenticated By: Charline Bills, M.D.     Lab Results: Basic Metabolic Panel:  Recent Labs  40/98/11 0824 05/09/12 0526 05/10/12 0548  NA  --  130* 132*  K  --  3.0* 3.6  CL  --  96 98  CO2  --  24 26  GLUCOSE  --  82 94  BUN  --  9 7  CREATININE  --  0.52 0.59  CALCIUM  --  7.7* 7.9*  MG 1.1*  --   --    Liver Function Tests: No results found for this basename: AST, ALT, ALKPHOS, BILITOT, PROT, ALBUMIN,  in the last 72 hours   CBC:  Recent Labs  05/09/12 0526 05/10/12 0548  WBC 4.4 4.8  HGB 8.9* 8.7*  HCT 24.3* 23.6*  MCV 94.6 94.4  PLT 127* 134*    Recent Results (from the past 240 hour(s))  CULTURE, BLOOD (ROUTINE X 2)     Status: None   Collection Time    05/02/12  3:09 PM      Result Value Range Status   Specimen Description BLOOD LEFT HAND   Final   Special Requests BOTTLES DRAWN  AEROBIC AND ANAEROBIC 6CC   Final   Culture NO GROWTH 5 DAYS   Final   Report Status 05/07/2012 FINAL   Final  CULTURE, BLOOD (ROUTINE X 2)     Status: None   Collection Time    05/02/12  3:15 PM      Result Value Range Status   Specimen Description BLOOD RIGHT ARM   Final   Special Requests     Final   Value: BOTTLES DRAWN AEROBIC AND ANAEROBIC AEB=12CC ANA=8CC   Culture NO GROWTH 5 DAYS   Final   Report Status 05/07/2012 FINAL   Final  MRSA PCR SCREENING     Status: None   Collection Time    05/02/12  6:17 PM      Result Value Range Status   MRSA by PCR NEGATIVE  NEGATIVE Final   Comment:            The GeneXpert MRSA Assay (FDA     approved for NASAL specimens     only), is one component of a     comprehensive MRSA colonization     surveillance program. It is not     intended to  diagnose MRSA     infection nor to guide or     monitor treatment for     MRSA infections.     Hospital Course:  This is a 79 years old male patient with history of multiple medical illness was admitted due to severe hypoglycemia. He had alcohol intoxication and pneumonia. His hypoglycemia was corrected and he was treated with IV antibiotics for pneumonia. He had sign of alcohol withdrwal for which he was treated according to protocol. He improved and discharged to nursing home.  Discharge Exam: Blood pressure 110/70, pulse 86, temperature 98.2 F (36.8 C), temperature source Oral, resp. rate 20, height 5\' 10"  (1.778 m), weight 51.8 kg (114 lb 3.2 oz), SpO2 97.00%.   Disposition:  Nursing home      Signed: Kort Stettler  05/10/2012, 8:02 AM

## 2012-05-10 NOTE — Clinical Social Work Placement (Signed)
    Clinical Social Work Department CLINICAL SOCIAL WORK PLACEMENT NOTE 05/10/2012  Patient:  Jermaine [name], Jermaine [name]  Account Number:  [id number] Admit date:  05/02/2012  Clinical Social Worker:  Sherrlyn Hock  Date/time:  05/08/2012 03:15 PM  Clinical Social Work is seeking post-discharge placement for this patient at the following level of care:   SKILLED NURSING   (*CSW will update this form in Epic as items are completed)   05/08/2012  Patient/family provided with [facility] Department of Clinical Social Work's list of facilities offering this level of care within the geographic area requested by the patient (or if unable, by the patient's family).  05/08/2012  Patient/family informed of their freedom to choose among providers that offer the needed level of care, that participate in Medicare, Medicaid or managed care program needed by the patient, have an available bed and are willing to accept the patient.  05/08/2012  Patient/family informed of [facility]' ownership interest in [facility], as well as of the fact that they are under no obligation to receive care at this facility.  PASARR submitted to EDS on  PASARR number received from EDS on   FL2 transmitted to all facilities in geographic area requested by pt/family on  05/08/2012 FL2 transmitted to all facilities within larger geographic area on   Patient informed that his/her managed care company has contracts with or will negotiate with  certain facilities, including the following:     Patient/family informed of bed offers received:  05/09/2012 Patient chooses bed at [facility] [place] Physician recommends and patient chooses bed at  [facility] [place]  Patient to be transferred to [facility] [place] on  05/10/2012 Patient to be transferred to facility by [facility]  The following physician request were entered in Epic:   Additional Comments: pt has existing pasarr number  Santa Genera,  LCSW Clinical Social Worker [phone])

## 2012-05-10 NOTE — Clinical Social Work Note (Signed)
Patient ready for discharge today to [facility], facility notified and agreeable.  Discharge summary faxed to facility via [facility].  Patient agreeable.  Son/friend Ed [employer] notified, agreeable, and willing to go to facility to sign preadmission paperwork approx 1 PM.  FL2 reviewed w RN and updated as needed.  Discharge packet prepared and placed w shadow chart for transport.  [facility] called to transport patient.  CSW signing off as no further SW needs identified.  Santa Genera, LCSW Clinical Social Worker [phone])

## 2012-05-15 ENCOUNTER — Emergency Department (HOSPITAL_COMMUNITY): Payer: Medicare Other

## 2012-05-15 ENCOUNTER — Observation Stay (HOSPITAL_COMMUNITY)
Admission: EM | Admit: 2012-05-15 | Discharge: 2012-05-18 | Disposition: A | Discharge status: Skilled Nursing Facility | Payer: Medicare Other | Attending: Internal Medicine | Admitting: Internal Medicine

## 2012-05-15 ENCOUNTER — Encounter (HOSPITAL_COMMUNITY): Payer: Self-pay

## 2012-05-15 DIAGNOSIS — R55 Syncope and collapse: Principal | ICD-10-CM | POA: Diagnosis present

## 2012-05-15 DIAGNOSIS — J449 Chronic obstructive pulmonary disease, unspecified: Secondary | ICD-10-CM | POA: Insufficient documentation

## 2012-05-15 DIAGNOSIS — D649 Anemia, unspecified: Secondary | ICD-10-CM | POA: Insufficient documentation

## 2012-05-15 DIAGNOSIS — R0602 Shortness of breath: Secondary | ICD-10-CM | POA: Insufficient documentation

## 2012-05-15 DIAGNOSIS — J4489 Other specified chronic obstructive pulmonary disease: Secondary | ICD-10-CM | POA: Insufficient documentation

## 2012-05-15 DIAGNOSIS — R0902 Hypoxemia: Secondary | ICD-10-CM | POA: Insufficient documentation

## 2012-05-15 DIAGNOSIS — T17908A Unspecified foreign body in respiratory tract, part unspecified causing other injury, initial encounter: Secondary | ICD-10-CM

## 2012-05-15 DIAGNOSIS — R112 Nausea with vomiting, unspecified: Secondary | ICD-10-CM | POA: Insufficient documentation

## 2012-05-15 HISTORY — DX: Hypoglycemia, unspecified: E16.2

## 2012-05-15 HISTORY — DX: Malignant (primary) neoplasm, unspecified: C80.1

## 2012-05-15 HISTORY — DX: Idiopathic aseptic necrosis of unspecified femur: M87.059

## 2012-05-15 HISTORY — DX: Acute embolism and thrombosis of unspecified vein: I82.90

## 2012-05-15 HISTORY — DX: Hematuria, unspecified: R31.9

## 2012-05-15 HISTORY — DX: Dysphagia, unspecified: R13.10

## 2012-05-15 HISTORY — DX: Syncope and collapse: R55

## 2012-05-15 LAB — BASIC METABOLIC PANEL
CO2: 27 mEq/L (ref 19–32)
Chloride: 99 mEq/L (ref 96–112)
Creatinine, Ser: 0.77 mg/dL (ref 0.50–1.35)

## 2012-05-15 LAB — CBC WITH DIFFERENTIAL/PLATELET
Basophils Absolute: 0.1 10*3/uL (ref 0.0–0.1)
HCT: 25.4 % — ABNORMAL LOW (ref 39.0–52.0)
Hemoglobin: 9.3 g/dL — ABNORMAL LOW (ref 13.0–17.0)
Lymphocytes Relative: 48 % — ABNORMAL HIGH (ref 12–46)
Monocytes Absolute: 0.5 10*3/uL (ref 0.1–1.0)
Neutro Abs: 2.3 10*3/uL (ref 1.7–7.7)
RDW: 19 % — ABNORMAL HIGH (ref 11.5–15.5)
WBC: 5.7 10*3/uL (ref 4.0–10.5)

## 2012-05-15 LAB — TROPONIN I
Troponin I: <0.3 ng/mL (ref <0.30)
Troponin I: <0.3 ng/mL (ref <0.30)

## 2012-05-15 MED ORDER — SODIUM CHLORIDE 0.9 % IV SOLN
3.0000 g | Freq: Four times a day (QID) | INTRAVENOUS | Status: DC
  Administered 2012-05-15 – 2012-05-18 (×11): 3 g via INTRAVENOUS
  Filled 2012-05-15 (×19): qty 3

## 2012-05-15 MED ORDER — ALBUTEROL SULFATE (5 MG/ML) 0.5% IN NEBU
2.5000 mg | INHALATION_SOLUTION | Freq: Once | RESPIRATORY_TRACT | Status: AC
  Administered 2012-05-15: 2.5 mg via RESPIRATORY_TRACT

## 2012-05-15 MED ORDER — ACETAMINOPHEN 650 MG RE SUPP: 650.0000 mg | Freq: Four times a day (QID) | RECTAL | Status: DC | PRN

## 2012-05-15 MED ORDER — SODIUM CHLORIDE 0.9 % IV SOLN
INTRAVENOUS | Status: DC
  Administered 2012-05-15 – 2012-05-17 (×4): via INTRAVENOUS

## 2012-05-15 MED ORDER — ENOXAPARIN SODIUM 40 MG/0.4ML ~~LOC~~ SOLN
40.0000 mg | SUBCUTANEOUS | Status: DC
  Administered 2012-05-15 – 2012-05-17 (×3): 40 mg via SUBCUTANEOUS
  Filled 2012-05-15 (×3): qty 0.4

## 2012-05-15 MED ORDER — SODIUM CHLORIDE 0.9 % IV SOLN
INTRAVENOUS | Status: AC
  Filled 2012-05-15 (×2): qty 3

## 2012-05-15 MED ORDER — MAGNESIUM OXIDE 400 (241.3 MG) MG PO TABS
400.0000 mg | ORAL_TABLET | Freq: Two times a day (BID) | ORAL | Status: DC
  Administered 2012-05-15 – 2012-05-18 (×6): 400 mg via ORAL
  Filled 2012-05-15 (×6): qty 1

## 2012-05-15 MED ORDER — ACETAMINOPHEN 325 MG PO TABS: 650.0000 mg | ORAL_TABLET | Freq: Four times a day (QID) | ORAL | Status: DC | PRN

## 2012-05-15 MED ORDER — SODIUM CHLORIDE 0.9 % IV SOLN: INTRAVENOUS | Status: DC

## 2012-05-15 MED ORDER — ALBUTEROL SULFATE (5 MG/ML) 0.5% IN NEBU: 2.5000 mg | INHALATION_SOLUTION | Freq: Four times a day (QID) | RESPIRATORY_TRACT | Status: DC | PRN

## 2012-05-15 MED ORDER — IOHEXOL 300 MG/ML  SOLN
100.0000 mL | Freq: Once | INTRAMUSCULAR | Status: AC | PRN
  Administered 2012-05-15: 100 mL via INTRAVENOUS

## 2012-05-15 MED ORDER — SODIUM CHLORIDE 0.9 % IJ SOLN: 3.0000 mL | Freq: Two times a day (BID) | INTRAMUSCULAR | Status: DC

## 2012-05-15 MED ORDER — ONDANSETRON HCL 4 MG PO TABS: 4.0000 mg | ORAL_TABLET | Freq: Four times a day (QID) | ORAL | Status: DC | PRN

## 2012-05-15 MED ORDER — BACLOFEN 10 MG PO TABS
10.0000 mg | ORAL_TABLET | Freq: Two times a day (BID) | ORAL | Status: DC
  Administered 2012-05-15 – 2012-05-18 (×6): 10 mg via ORAL
  Filled 2012-05-15 (×6): qty 1

## 2012-05-15 MED ORDER — ADULT MULTIVITAMIN W/MINERALS CH
1.0000 | ORAL_TABLET | Freq: Every day | ORAL | Status: DC
  Administered 2012-05-16 – 2012-05-18 (×3): 1 via ORAL
  Filled 2012-05-15 (×3): qty 1

## 2012-05-15 MED ORDER — FOLIC ACID 1 MG PO TABS
1.0000 mg | ORAL_TABLET | Freq: Every day | ORAL | Status: DC
Start: 2012-05-16 — End: 2012-05-18
  Administered 2012-05-16 – 2012-05-18 (×3): 1 mg via ORAL
  Filled 2012-05-15 (×3): qty 1

## 2012-05-15 MED ORDER — ASPIRIN 81 MG PO CHEW
81.0000 mg | CHEWABLE_TABLET | Freq: Every day | ORAL | Status: DC
  Administered 2012-05-16 – 2012-05-18 (×3): 81 mg via ORAL
  Filled 2012-05-15 (×3): qty 1

## 2012-05-15 MED ORDER — ONDANSETRON HCL 4 MG/2ML IJ SOLN: 4.0000 mg | Freq: Four times a day (QID) | INTRAMUSCULAR | Status: DC | PRN

## 2012-05-15 NOTE — ED Notes (Signed)
Dr. Kerry Hough here to eval

## 2012-05-15 NOTE — ED Notes (Signed)
Attempted to call [facility] to inform then of pt being admitted. No one ever answered the phone

## 2012-05-15 NOTE — ED Notes (Signed)
EMS reports pt was sitting down to eat and passed out.  EMS reports pt's room air 02 sat was 79%.  EMS placed pt on cpap and sat increased to 100% and pt became alert.  Pt presently alert, talking, answering questions.  Pt denies any pain.

## 2012-05-15 NOTE — ED Notes (Signed)
Nurse request pt be held about twenty minutes

## 2012-05-15 NOTE — ED Notes (Signed)
Pt pulled IV out. Waiting to be evaluated for admission.

## 2012-05-15 NOTE — H&P (Signed)
[facility] History and Physical  Jermaine [name] ZOX:[id number] DOB: [birth year] DOA: 05/15/2012  Referring physician: Dr. Oletta Cohn PCP: Avon Gully, MD  Specialists:   Chief Complaint: Syncope  HPI: Jermaine [name] is a 79 y.o. male with multiple medical problems who was recently discharged from the hospital. Patient was in a skilled nursing facility when he was having breakfast this morning. Patient suddenly had a syncopal episode and passed out. EMS was called and the patient was found to be hypoxic. He required supplemental oxygen and subsequently CPAP. He was brought to the hospital where his oxygen saturation resolved and he was taken off oxygen. His mental status was back to baseline. He denies any presyncopal chest pain, shortness of breath. He did feel in his usual state of health. He's not had any fevers. There is no reports of nausea vomiting or diarrhea. Due to his syncopal episode and pronounced hypoxia prior to admission, he has been referred for observation.  Review of Systems: Difficult to assess due to patient's speech, pertinent positives are per history of present illness, otherwise negative  Past Medical History  Diagnosis Date  . COPD (chronic obstructive pulmonary disease)   . Chronic hip pain     Avascular necrosis, bilateral hips.  Marland Kitchen Spinal stenosis   . DVT (deep venous thrombosis)   . PE (pulmonary embolism) May/2012.  Marland Kitchen Alcohol abuse   . Elevated PSA     Lost to followup.  . Macrocytic anemia   . Lytic lesion of bone on x-ray May/2012.    Negative bone scan.  . Bilateral pneumonia May/2012.  Marland Kitchen Thoracic compression fracture   . Tobacco abuse   . Alcohol withdrawal syndrome 06/10/2011  . Delirium, drug-induced 06/2011    From Ativan  . Abnormal thyroid function test 06/2011.    Normal TSH and slightly low free T4.  Marland Kitchen Hypoglycemia   . Dysphagia   . Syncope and collapse   . Hematuria   . Spinal stenosis   . Cancer     malignant neoplasm of prostate  .  Venous thrombosis   . 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 Total hip arthroplasty      Left hip.  . Tonsillectomy      as child  . Cystoscopy  10/17/2011    Procedure: CYSTOSCOPY;  Surgeon: Anner Crete, MD;  Location: [facility];  Service: Urology;  Laterality: N/A;  Cystoscopy/Orchiectomy/Transrectal Ultrasound Guided Prostate Biopsy  . Orchiectomy  10/17/2011    Procedure: ORCHIECTOMY;  Surgeon: Anner Crete, MD;  Location: [facility];  Service: Urology;  Laterality: Bilateral;  . Prostate biopsy  10/17/2011    Procedure: BIOPSY TRANSRECTAL ULTRASONIC PROSTATE (TUBP);  Surgeon: Anner Crete, MD;  Location: [facility];  Service: Urology;  Laterality: N/A;   Social History:  reports that he has been smoking Cigarettes.  He has a 30 pack-year smoking history. He does not have any smokeless tobacco history on file. He reports that he drinks about 24.0 ounces of alcohol per week. He reports that he does not use illicit drugs. Patient is at [facility] nursing home  No Known Allergies  Family history: Patient is unaware of any medical problems in his family.  Prior to Admission medications   Medication Sig Start Date End Date Taking? Authorizing Provider  aspirin 81 MG tablet Take 81 mg by mouth daily.   Yes Historical Provider, MD  baclofen (LIORESAL) 10 MG tablet  Take 10 mg by mouth 2 (two) times daily.   Yes Historical Provider, MD  folic acid (FOLVITE) 1 MG tablet Take 1 tablet (1 mg total) by mouth daily. 05/10/12  Yes Avon Gully, MD  magnesium oxide (MAG-OX) 400 (241.3 MG) MG tablet Take 1 tablet (400 mg total) by mouth 2 (two) times daily. 05/10/12  Yes Avon Gully, MD  Multiple Vitamin (MULTIVITAMIN WITH MINERALS) TABS Take 1 tablet by mouth daily. 05/10/12  Yes Avon Gully, MD  albuterol (PROVENTIL) (2.5 MG/3ML) 0.083% nebulizer solution Take 2.5 mg by nebulization every 6 (six) hours as needed. For shortness of breath    Historical  Provider, MD   Physical Exam: Filed Vitals:   05/15/12 1246 05/15/12 1539 05/15/12 1658 05/15/12 1701  BP: 123/71 109/76  101/67  Pulse:  70  74  Temp: 98.6 F (37 C)     TempSrc: Oral     Resp: 19 18  18   SpO2: 99% 99% 97% 100%     General:  No acute distress  Eyes: Pupils are equal round react to light  ENT: Mucous membranes are moist  Neck: Supple  Cardiovascular: S1, S2, regular rate and rhythm  Respiratory: Rhonchi bilaterally  Abdomen: Soft, nontender, nondistended, bowel sounds are active  Skin: No rashes  Musculoskeletal: No pedal edema bilaterally  Psychiatric: Appears confused and agitated  Neurologic: Grossly intact, nonfocal  Labs on Admission:  Basic Metabolic Panel:  Recent Labs Lab 05/09/12 0526 05/10/12 0548 05/15/12 1248  NA 130* 132* 135  K 3.0* 3.6 4.2  CL 96 98 99  CO2 24 26 27   GLUCOSE 82 94 118*  BUN 9 7 6   CREATININE 0.52 0.59 0.77  CALCIUM 7.7* 7.9* 8.3*   Liver Function Tests: No results found for this basename: AST, ALT, ALKPHOS, BILITOT, PROT, ALBUMIN,  in the last 168 hours No results found for this basename: LIPASE, AMYLASE,  in the last 168 hours No results found for this basename: AMMONIA,  in the last 168 hours CBC:  Recent Labs Lab 05/09/12 0526 05/10/12 0548 05/15/12 1248  WBC 4.4 4.8 5.7  NEUTROABS  --   --  2.3  HGB 8.9* 8.7* 9.3*  HCT 24.3* 23.6* 25.4*  MCV 94.6 94.4 93.7  PLT 127* 134* 253   Cardiac Enzymes:  Recent Labs Lab 05/15/12 1248  TROPONINI <0.30    BNP (last 3 results)  Recent Labs  05/15/12 1248  PROBNP 667.6*   CBG:  Recent Labs Lab 05/09/12 0746 05/09/12 1112 05/09/12 1630 05/09/12 2048 05/10/12 0718  GLUCAP 77 99 97 92 89    Radiological Exams on Admission: Ct Angio Chest Pe W/cm &/or Wo Cm  05/15/2012   *RADIOLOGY REPORT*  Clinical Data: Syncope.  Shortness of breath.  CT ANGIOGRAPHY CHEST  Technique:  Multidetector CT imaging of the chest using the standard  protocol during bolus administration of intravenous contrast. Multiplanar reconstructed images including MIPs were obtained and reviewed to evaluate the vascular anatomy.  Contrast: OMNIPAQUE IOHEXOL 300 MG/ML  SOLN  Comparison: CTA chest 08/09/2008.  Findings: Pulmonary arterial opacification is excellent.  There are no focal filling defects to suggest pulmonary emboli.  The ascending thoracic aorta is slightly larger than on the prior study, now measuring 47 or 48 mm compared with 46 mm. Atherosclerotic calcifications are present at the aortic arch and descending aorta without aneurysm or rupture. The left adrenal adenoma is stable in size, measuring 11 mm in short axis.  Limited imaging of the upper  abdomen is otherwise unremarkable.  Centrilobular emphysematous changes are present.  A small right pleural effusion is present.  Areas of linear atelectasis are present in the lower lobes bilaterally.  Age indeterminate fractures at T7, T9 with T10, T11, and T12 are new since the prior exam.  IMPRESSION:  1.  Multiple age indeterminate compression fractures from T7-C12. 2.  Linear and dependent atelectasis in the lower lobes bilaterally. 3.  No evidence for pulmonary embolus. 4.  Slight worsening of the ascending aortic aneurysm now measuring 47 or 48 mm compared with 46 mm previously.   Original Report Authenticated By: Marin Roberts, M.D.   Dg Chest Portable 1 View  05/15/2012   *RADIOLOGY REPORT*  Clinical Data: Loss of consciousness, COPD, prostate cancer  PORTABLE CHEST - 1 VIEW  Comparison: 05/02/2012  Findings: Chronic interstitial markings/emphysematous changes. Bilateral lower lobe scarring.  No focal consolidation.  No pleural effusion or pneumothorax.  The heart is normal in size.  Status post ORIF of the left mandible.  IMPRESSION: No evidence of acute cardiopulmonary disease.  Chronic interstitial markings/emphysematous changes with bilateral lower lobe scarring.   Original Report  Authenticated By: Charline Bills, M.D.    EKG: Independently reviewed. Sinus rhythm, prolonged QT, PACs  Assessment/Plan Active Problems:   Anemia   Syncope   Aspiration into respiratory tract   Hypoxia   1. Syncope. Unclear etiology. He does have prolonged QT on his EKG, this will be repeated. We will monitor him on telemetry for any arrhythmias. Cycle cardiac markers. CT angiograms negative for pulmonary embolus. 2. Aspiration. Patient likely aspirated after he syncopized. We'll start the patient empirically on Unasyn and monitor for any signs of developing pneumonia. He is currently breathing comfortably in room air, we will continue to monitor to make sure his respiratory status does not decompensate. 3. Anemia. Likely due to chronic disease, at baseline. 4. Disposition. Likely returns to the facility tomorrow stable. He'll be admitted under observation.   Patient will be admitted under the service of Dr. [name]. [facility] will be available for any questions/concerns until 7 AM on 5/15  Code Status: Full code Family Communication: No family at the bedside Disposition Plan: Return to skilled nursing facility on discharge  Time spent:  [facility] Pager [phone]  If 7PM-7AM, please contact night-coverage [email or web address] Password [facility] 05/15/2012, 6:48 PM

## 2012-05-15 NOTE — Progress Notes (Signed)
ANTIBIOTIC CONSULT NOTE - INITIAL  Pharmacy Consult for Unasyn Indication: pneumonia  No Known Allergies  Patient Measurements:   Last recorded patient weight: 51.8kg (05/05/12)  Vital Signs: Temp: 97.4 F (36.3 C) (05/14 2008) Temp src: Oral (05/14 2008) BP: 107/63 mmHg (05/14 2008) Pulse Rate: 110 (05/14 2008) Intake/Output from previous day:   Intake/Output from this shift:    Labs:  Recent Labs  05/15/12 1248  WBC 5.7  HGB 9.3*  PLT 253  CREATININE 0.77   The CrCl is unknown because both a height and weight (above a minimum accepted value) are required for this calculation. No results found for this basename: VANCOTROUGH, Leodis Binet, VANCORANDOM, GENTTROUGH, GENTPEAK, GENTRANDOM, TOBRATROUGH, TOBRAPEAK, TOBRARND, AMIKACINPEAK, AMIKACINTROU, AMIKACIN,  in the last 72 hours   Microbiology: Recent Results (from the past 720 hour(s))  CULTURE, BLOOD (ROUTINE X 2)     Status: None   Collection Time    05/02/12  3:09 PM      Result Value Range Status   Specimen Description BLOOD LEFT HAND   Final   Special Requests BOTTLES DRAWN AEROBIC AND ANAEROBIC 6CC   Final   Culture NO GROWTH 5 DAYS   Final   Report Status 05/07/2012 FINAL   Final  CULTURE, BLOOD (ROUTINE X 2)     Status: None   Collection Time    05/02/12  3:15 PM      Result Value Range Status   Specimen Description BLOOD RIGHT ARM   Final   Special Requests     Final   Value: BOTTLES DRAWN AEROBIC AND ANAEROBIC AEB=12CC ANA=8CC   Culture NO GROWTH 5 DAYS   Final   Report Status 05/07/2012 FINAL   Final  MRSA PCR SCREENING     Status: None   Collection Time    05/02/12  6:17 PM      Result Value Range Status   MRSA by PCR NEGATIVE  NEGATIVE Final   Comment:            The GeneXpert MRSA Assay (FDA     approved for NASAL specimens     only), is one component of a     comprehensive MRSA colonization     surveillance program. It is not     intended to diagnose MRSA     infection nor to guide or   monitor treatment for     MRSA infections.    Medical History: Past Medical History  Diagnosis Date  . COPD (chronic obstructive pulmonary disease)   . Chronic hip pain     Avascular necrosis, bilateral hips.  Marland Kitchen Spinal stenosis   . DVT (deep venous thrombosis)   . PE (pulmonary embolism) May/2012.  Marland Kitchen Alcohol abuse   . Elevated PSA     Lost to followup.  . Macrocytic anemia   . Lytic lesion of bone on x-ray May/2012.    Negative bone scan.  . Bilateral pneumonia May/2012.  Marland Kitchen Thoracic compression fracture   . Tobacco abuse   . Alcohol withdrawal syndrome 06/10/2011  . Delirium, drug-induced 06/2011    From Ativan  . Abnormal thyroid function test 06/2011.    Normal TSH and slightly low free T4.  Marland Kitchen Hypoglycemia   . Dysphagia   . Syncope and collapse   . Hematuria   . Spinal stenosis   . Cancer     malignant neoplasm of prostate  . Venous thrombosis   . 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 ampicillin-sulbactam (UNASYN) IV  3 g Intravenous Q6H  . [START ON 05/16/2012] aspirin  81 mg Oral Daily  . baclofen  10 mg Oral BID  . enoxaparin (LOVENOX) injection  40 mg Subcutaneous Q24H  . [START ON 05/16/2012] folic acid  1 mg Oral Daily  . magnesium oxide  400 mg Oral BID  . [START ON 05/16/2012] multivitamin with minerals  1 tablet Oral Daily  . sodium chloride  3 mL Intravenous Q12H   Assessment: 79 yo M admitted from NH after syncopal episode while eating breakfast.  He is empirically starting Unasyn for possible aspiration PNA.   Estimated CrCl ~ 50-55 ml/min  Goal of Therapy:  Eradicate infection.  Plan:  1) Unasyn 3gm IV Q6h 2) Monitor renal function and cx data   Elson Clan 05/15/2012,9:21 PM

## 2012-05-15 NOTE — ED Notes (Signed)
Attempted to give report. Nurse unavailable at present

## 2012-05-15 NOTE — ED Provider Notes (Signed)
History     CSN: [id number]  Arrival date & time 05/15/12  1223   First MD Initiated Contact with Patient 05/15/12 1258      Chief Complaint  Patient presents with  . Loss of Consciousness    (Consider location/radiation/quality/duration/timing/severity/associated sxs/prior treatment) HPI Comments: Patient brought to the ER for evaluation of syncope. Patient was working with speech therapy when he suddenly passed out. Patient was unresponsive for several minutes, then spontaneously regained consciousness. EMS report room air oxygen saturation of 79%, their arrival. He was placed on CPAP and transported. At time of arrival to the ER patient was awake and alert with percent saturations. Patient denies any pain. He did not have any precursor symptoms prior to passing out. He feels like he is back to his normal self today.  Patient is a 79 y.o. male presenting with syncope.  Loss of Consciousness     Past Medical History  Diagnosis Date  . COPD (chronic obstructive pulmonary disease)   . Chronic hip pain     Avascular necrosis, bilateral hips.  Marland Kitchen Spinal stenosis   . DVT (deep venous thrombosis)   . PE (pulmonary embolism) May/2012.  Marland Kitchen Alcohol abuse   . Elevated PSA     Lost to followup.  . Macrocytic anemia   . Lytic lesion of bone on x-ray May/2012.    Negative bone scan.  . Bilateral pneumonia May/2012.  Marland Kitchen Thoracic compression fracture   . Tobacco abuse   . Alcohol withdrawal syndrome 06/10/2011  . Delirium, drug-induced 06/2011    From Ativan  . Abnormal thyroid function test 06/2011.    Normal TSH and slightly low free T4.  Marland Kitchen Hypoglycemia   . Dysphagia   . Syncope and collapse   . Hematuria   . Spinal stenosis   . Cancer     malignant neoplasm of prostate  . Venous thrombosis   . 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 Total hip arthroplasty      Left hip.  . Tonsillectomy      as child  . Cystoscopy   10/17/2011    Procedure: CYSTOSCOPY;  Surgeon: Anner Crete, MD;  Location: [facility];  Service: Urology;  Laterality: N/A;  Cystoscopy/Orchiectomy/Transrectal Ultrasound Guided Prostate Biopsy  . Orchiectomy  10/17/2011    Procedure: ORCHIECTOMY;  Surgeon: Anner Crete, MD;  Location: [facility];  Service: Urology;  Laterality: Bilateral;  . Prostate biopsy  10/17/2011    Procedure: BIOPSY TRANSRECTAL ULTRASONIC PROSTATE (TUBP);  Surgeon: Anner Crete, MD;  Location: [facility];  Service: Urology;  Laterality: N/A;    No family history on file.  History  Substance Use Topics  . Smoking status: Current Every Day Smoker -- 0.50 packs/day for 60 years    Types: Cigarettes  . Smokeless tobacco: Not on file  . Alcohol Use: 24.0 oz/week    40 Shots of liquor per week     Comment: diagnosed wtih etoh intoxication 05/10/12      Review of Systems  Cardiovascular: Positive for syncope.  Neurological: Positive for syncope.  All other systems reviewed and are negative.    Allergies  Review of patient's allergies indicates no known allergies.  Home Medications   Current Outpatient Rx  Name  Route  Sig  Dispense  Refill  . aspirin 81 MG tablet   Oral   Take 81 mg by mouth daily.         Marland Kitchen  baclofen (LIORESAL) 10 MG tablet   Oral   Take 10 mg by mouth 2 (two) times daily.         . folic acid (FOLVITE) 1 MG tablet   Oral   Take 1 tablet (1 mg total) by mouth daily.   30 tablet   3   . magnesium oxide (MAG-OX) 400 (241.3 MG) MG tablet   Oral   Take 1 tablet (400 mg total) by mouth 2 (two) times daily.   60 tablet   3   . Multiple Vitamin (MULTIVITAMIN WITH MINERALS) TABS   Oral   Take 1 tablet by mouth daily.   30 tablet   3   . albuterol (PROVENTIL) (2.5 MG/3ML) 0.083% nebulizer solution   Nebulization   Take 2.5 mg by nebulization every 6 (six) hours as needed. For shortness of breath           BP 123/71  Temp(Src) 98.6 F (37 C) (Oral)  Resp 19  SpO2  99%  Physical Exam  Constitutional: He is oriented to person, place, and time. He appears well-developed and well-nourished. No distress.  HENT:  Head: Normocephalic and atraumatic.  Right Ear: Hearing normal.  Left Ear: Hearing normal.  Nose: Nose normal.  Mouth/Throat: Oropharynx is clear and moist and mucous membranes are normal.  Eyes: Conjunctivae and EOM are normal. Pupils are equal, round, and reactive to light.  Neck: Normal range of motion. Neck supple.  Cardiovascular: Regular rhythm, S1 normal and S2 normal.  Exam reveals no gallop and no friction rub.   No murmur heard. Pulmonary/Chest: Effort normal. No respiratory distress. He has decreased breath sounds in the right lower field and the left lower field. He has no wheezes. He has rhonchi in the right lower field and the left lower field. He has rales in the right lower field and the left lower field. He exhibits no tenderness.  Abdominal: Soft. Normal appearance and bowel sounds are normal. There is no hepatosplenomegaly. There is no tenderness. There is no rebound, no guarding, no tenderness at McBurney's point and negative Murphy's sign. No hernia.  Musculoskeletal: Normal range of motion.  Neurological: He is alert and oriented to person, place, and time. He has normal strength. No cranial nerve deficit or sensory deficit. Coordination normal. GCS eye subscore is 4. GCS verbal subscore is 5. GCS motor subscore is 6.  Skin: Skin is warm, dry and intact. No rash noted. No cyanosis.  Psychiatric: He has a normal mood and affect. His speech is normal and behavior is normal. Thought content normal.    ED Course  Procedures (including critical care time)   EKG:  Date: 05/15/2012  Rate: 78  Rhythm: normal sinus rhythm and premature atrial contractions (PAC)  QRS Axis: normal  Intervals: QT prolonged  ST/T Wave abnormalities: normal  Conduction Disutrbances:none  Narrative Interpretation:   Old EKG Reviewed:  unchanged   Labs Reviewed  CBC WITH DIFFERENTIAL - Abnormal; Notable for the following:    RBC 2.71 (*)    Hemoglobin 9.3 (*)    HCT 25.4 (*)    MCH 34.3 (*)    MCHC 36.6 (*)    RDW 19.0 (*)    Neutrophils Relative % 40 (*)    Lymphocytes Relative 48 (*)    Basophils Relative 2 (*)    All other components within normal limits  BASIC METABOLIC PANEL - Abnormal; Notable for the following:    Glucose, Bld 118 (*)    Calcium 8.3 (*)  GFR calc non Af Amer 85 (*)    All other components within normal limits  D-DIMER, QUANTITATIVE - Abnormal; Notable for the following:    D-Dimer, Quant 2.09 (*)    All other components within normal limits  PRO B NATRIURETIC PEPTIDE - Abnormal; Notable for the following:    Pro B Natriuretic peptide (BNP) 667.6 (*)    All other components within normal limits  TROPONIN I   Ct Angio Chest Pe W/cm &/or Wo Cm  05/15/2012   *RADIOLOGY REPORT*  Clinical Data: Syncope.  Shortness of breath.  CT ANGIOGRAPHY CHEST  Technique:  Multidetector CT imaging of the chest using the standard protocol during bolus administration of intravenous contrast. Multiplanar reconstructed images including MIPs were obtained and reviewed to evaluate the vascular anatomy.  Contrast: OMNIPAQUE IOHEXOL 300 MG/ML  SOLN  Comparison: CTA chest 08/09/2008.  Findings: Pulmonary arterial opacification is excellent.  There are no focal filling defects to suggest pulmonary emboli.  The ascending thoracic aorta is slightly larger than on the prior study, now measuring 47 or 48 mm compared with 46 mm. Atherosclerotic calcifications are present at the aortic arch and descending aorta without aneurysm or rupture. The left adrenal adenoma is stable in size, measuring 11 mm in short axis.  Limited imaging of the upper abdomen is otherwise unremarkable.  Centrilobular emphysematous changes are present.  A small right pleural effusion is present.  Areas of linear atelectasis are present in the lower  lobes bilaterally.  Age indeterminate fractures at T7, T9 with T10, T11, and T12 are new since the prior exam.  IMPRESSION:  1.  Multiple age indeterminate compression fractures from T7-C12. 2.  Linear and dependent atelectasis in the lower lobes bilaterally. 3.  No evidence for pulmonary embolus. 4.  Slight worsening of the ascending aortic aneurysm now measuring 47 or 48 mm compared with 46 mm previously.   Original Report Authenticated By: Marin Roberts, M.D.   Dg Chest Portable 1 View  05/15/2012   *RADIOLOGY REPORT*  Clinical Data: Loss of consciousness, COPD, prostate cancer  PORTABLE CHEST - 1 VIEW  Comparison: 05/02/2012  Findings: Chronic interstitial markings/emphysematous changes. Bilateral lower lobe scarring.  No focal consolidation.  No pleural effusion or pneumothorax.  The heart is normal in size.  Status post ORIF of the left mandible.  IMPRESSION: No evidence of acute cardiopulmonary disease.  Chronic interstitial markings/emphysematous changes with bilateral lower lobe scarring.   Original Report Authenticated By: Charline Bills, M.D.     Diagnosis: Syncope    MDM  Patient comes to the ER for evaluation of syncope. Patient had an episode of significant hypoxemia during a syncopal episode and then recovered on his own. Arrival to the ER, patient has significant rhonchi on examination, although is no hypoxia any longer. His workup has been largely unremarkable including ruling out for acute PE. I am concerned about the possibility of aspiration, however, based on the patient's presentation. Patient will be observed on telemetry because of the syncope and possible aspiration.        Gilda Crease, MD 05/16/12 [phone]

## 2012-05-15 NOTE — ED Notes (Signed)
Pt refused meal tray. Stated he wanted to go to his room before h ate

## 2012-05-15 NOTE — Procedures (Signed)
Intubation Procedure Note DENO SIDA [id number] 1931-02-13  Procedure: Intubation Indications: Airway protection and maintenance  Procedure Details Consent: Risks of procedure as well as the alternatives and risks of each were explained to the (patient/caregiver).  Consent for procedure obtained. Time Out: Verified patient identification, verified procedure, site/side was marked, verified correct patient position, special equipment/implants available, medications/allergies/relevent history reviewed, required imaging and test results available.  Performed  Maximum sterile technique was used including gloves and hand hygiene.  MAC and 4    Evaluation Hemodynamic Status: BP stable throughout; O2 sats: stable throughout Patient's Current Condition: stable Complications: No apparent complications Patient did tolerate procedure well. Chest X-ray ordered to verify placement.  CXR: pending.   Katheren Shams 05/15/2012

## 2012-05-16 ENCOUNTER — Observation Stay (HOSPITAL_COMMUNITY): Payer: Medicare Other

## 2012-05-16 LAB — TSH: TSH: 1.201 u[IU]/mL (ref 0.350–4.500)

## 2012-05-16 LAB — URINALYSIS, ROUTINE W REFLEX MICROSCOPIC
Ketones, ur: NEGATIVE mg/dL
Leukocytes, UA: NEGATIVE
Nitrite: NEGATIVE
Protein, ur: NEGATIVE mg/dL
Urobilinogen, UA: 0.2 mg/dL (ref 0.0–1.0)

## 2012-05-16 LAB — CBC
MCV: 96.2 fL (ref 78.0–100.0)
Platelets: 221 10*3/uL (ref 150–400)
RBC: 2.35 MIL/uL — ABNORMAL LOW (ref 4.22–5.81)
WBC: 3.7 10*3/uL — ABNORMAL LOW (ref 4.0–10.5)

## 2012-05-16 LAB — BASIC METABOLIC PANEL
CO2: 28 mEq/L (ref 19–32)
Calcium: 7.9 mg/dL — ABNORMAL LOW (ref 8.4–10.5)
GFR calc non Af Amer: >90 mL/min (ref >90)
Sodium: 135 mEq/L (ref 135–145)

## 2012-05-16 LAB — TROPONIN I: Troponin I: <0.3 ng/mL (ref <0.30)

## 2012-05-16 MED ORDER — ENSURE COMPLETE PO LIQD
237.0000 mL | Freq: Three times a day (TID) | ORAL | Status: DC
  Administered 2012-05-16 – 2012-05-18 (×5): 237 mL via ORAL

## 2012-05-16 MED ORDER — PRO-STAT SUGAR FREE PO LIQD
30.0000 mL | Freq: Three times a day (TID) | ORAL | Status: DC
  Administered 2012-05-16 – 2012-05-18 (×5): 30 mL via ORAL
  Filled 2012-05-16 (×5): qty 30

## 2012-05-16 NOTE — Progress Notes (Signed)
INITIAL NUTRITION ASSESSMENT  DOCUMENTATION CODES Per approved criteria  -Severe  malnutrition in the context of social or environmental circumstances   INTERVENTION: Ensure Complete po BID, each supplement provides 350 kcal and 13 grams of protein. 30 ml Prostat TID, each supplement provides 100 kcals and 15 grams protein.   NUTRITION DIAGNOSIS: Inadequate oral food intake related to decreased appetite, wt loss as evidenced by PO: 0-25%, 18% wt loss x 1 month.   Goal: Pt will meet >/=90% of estimated energy needs.  Monitor:  PO intake, skin integrity, labs, wt changes, changes in status  Reason for Assessment: MST=3  79 y.o. male  Admitting Dx: <principal problem not specified>  ASSESSMENT: Pt familiar due to recent previous admission, discharged from [facility] 05/02/12. Pt is now a resident of [facility]. Admitted for syncopal episode and possible aspiration.  Chart reviewed. Intake very poor during previous hospitalization (PO: 0-25). Wt hx reveals a 25# (18%) wt loss x 1 month, which is clinically significant.  Pt with hx of alcoholism, with DTs during previous hospitalization.  Noted stage 1 pressure ulcer on buttocks.  Pt meets criteria for severe MALNUTRITION in the context of social or environmental circumstances as evidenced by <75% of estimated energy needs consumed x 1 month, 18% wt loss x 1 month. Malnutrition diagnosis continues from previous hospitalization.   Height: Ht Readings from Last 1 Encounters:  05/15/12 5\' 10"  (1.778 m)    Weight: Wt Readings from Last 1 Encounters:  05/15/12 113 lb (51.256 kg)    Ideal Body Weight: 166#  % Ideal Body Weight: 68%  Wt Readings from Last 10 Encounters:  05/15/12 113 lb (51.256 kg)  05/05/12 114 lb 3.2 oz (51.8 kg)  10/17/11 99 lb 13.9 oz (45.3 kg)  10/17/11 99 lb 13.9 oz (45.3 kg)  10/13/11 100 lb (45.36 kg)  06/12/11 110 lb 4.8 oz (50.032 kg)  04/07/11 139 lb (63.05 kg)  08/29/07 132 lb (59.875 kg)    Usual Body  Weight: 134#  % Usual Body Weight: 84%  BMI:  Body mass index is 16.21 kg/(m^2).  Estimated Nutritional Needs: Kcal: 2440-1027 daily Protein: 62-77 grams daily Fluid: 1.5-1.8 L daily  Skin: stage I pressure ulcer on buttocks  Diet Order: General  EDUCATION NEEDS: -Education not appropriate at this time   Intake/Output Summary (Last 24 hours) at 05/16/12 1346 Last data filed at 05/16/12 0701  Gross per 24 hour  Intake      0 ml  Output    600 ml  Net   -600 ml    Last BM: 05/14/12   Labs:   Recent Labs Lab 05/10/12 0548 05/15/12 1248 05/16/12 0201  NA 132* 135 135  K 3.6 4.2 4.0  CL 98 99 102  CO2 26 27 28   BUN 7 6 5*  CREATININE 0.59 0.77 0.66  CALCIUM 7.9* 8.3* 7.9*  GLUCOSE 94 118* 76    CBG (last 3)  No results found for this basename: GLUCAP,  in the last 72 hours  Scheduled Meds: . ampicillin-sulbactam (UNASYN) IV  3 g Intravenous Q6H  . aspirin  81 mg Oral Daily  . baclofen  10 mg Oral BID  . enoxaparin (LOVENOX) injection  40 mg Subcutaneous Q24H  . folic acid  1 mg Oral Daily  . magnesium oxide  400 mg Oral BID  . multivitamin with minerals  1 tablet Oral Daily  . sodium chloride  3 mL Intravenous Q12H    Continuous Infusions: . sodium chloride 75  mL/hr at 05/16/12 1324    Past Medical History  Diagnosis Date  . COPD (chronic obstructive pulmonary disease)   . Chronic hip pain     Avascular necrosis, bilateral hips.  Marland Kitchen Spinal stenosis   . DVT (deep venous thrombosis)   . PE (pulmonary embolism) May/2012.  Marland Kitchen Alcohol abuse   . Elevated PSA     Lost to followup.  . Macrocytic anemia   . Lytic lesion of bone on x-ray May/2012.    Negative bone scan.  . Bilateral pneumonia May/2012.  Marland Kitchen Thoracic compression fracture   . Tobacco abuse   . Alcohol withdrawal syndrome 06/10/2011  . Delirium, drug-induced 06/2011    From Ativan  . Abnormal thyroid function test 06/2011.    Normal TSH and slightly low free T4.  Marland Kitchen Hypoglycemia   .  Dysphagia   . Syncope and collapse   . Hematuria   . Spinal stenosis   . Cancer     malignant neoplasm of prostate  . Venous thrombosis   . 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 Total hip arthroplasty      Left hip.  . Tonsillectomy      as child  . Cystoscopy  10/17/2011    Procedure: CYSTOSCOPY;  Surgeon: Anner Crete, MD;  Location: [facility];  Service: Urology;  Laterality: N/A;  Cystoscopy/Orchiectomy/Transrectal Ultrasound Guided Prostate Biopsy  . Orchiectomy  10/17/2011    Procedure: ORCHIECTOMY;  Surgeon: Anner Crete, MD;  Location: [facility];  Service: Urology;  Laterality: Bilateral;  . Prostate biopsy  10/17/2011    Procedure: BIOPSY TRANSRECTAL ULTRASONIC PROSTATE (TUBP);  Surgeon: Anner Crete, MD;  Location: [facility];  Service: Urology;  Laterality: N/A;    Melody Haver, RD, LDN Pager: [phone]

## 2012-05-16 NOTE — Clinical Social Work Psychosocial (Signed)
Clinical Social Work Department BRIEF PSYCHOSOCIAL ASSESSMENT 05/16/2012  Patient:  Jermaine [name], Jermaine [name]     Account Number:  [id number]     Admit date:  05/15/2012  Clinical Social Worker:  Nancie Neas  Date/Time:  05/16/2012 09:20 AM  Referred by:  CSW  Date Referred:  05/16/2012 Referred for  SNF Placement   Other Referral:   Interview type:  Patient Other interview type:    PSYCHOSOCIAL DATA Living Status:  FACILITY Admitted from facility:  [facility] Level of care:  Skilled Nursing Facility Primary support name:  Ed Primary support relationship to patient:  FAMILY Degree of support available:   supportive    CURRENT CONCERNS Current Concerns  Post-Acute Placement   Other Concerns:    SOCIAL WORK ASSESSMENT / PLAN CSW met with pt at bedside. Pt alert and oriented. He is well known to CSW from admission several weeks ago. Pt was in DTs during that hospitalization and family signed him into [facility]. Pt told CSW he has been at [facility] and it is "really nice over there." His family has checked on him some, particularly cousins. Pt indicates he has been doing therapy, but does not feel he is getting stronger. Pt plans to return to [facility] when stable. CSW spoke with Eunice Blase at [facility] who reports okay for pt to return.   Assessment/plan status:  Psychosocial Support/Ongoing Assessment of Needs Other assessment/ plan:   Information/referral to community resources:   [facility]    PATIENT'S/FAMILY'S RESPONSE TO PLAN OF CARE: Pt reports very positive feelings regarding return to [facility] when medically stable. Pt much more oriented this hospitalization. CSW to continue to follow.       Derenda Fennel, [place] [phone]

## 2012-05-16 NOTE — Progress Notes (Signed)
Subjective: Patient was admitted last night due to syncopal episode and possible aspiration pneumonia. Patient was hypoxia intimally. He is also anemic. He is doing better.  Objective: Vital signs in last 24 hours: Temp:  [97.4 F (36.3 C)-98.6 F (37 C)] 98.2 F (36.8 C) (05/15 0700) Pulse Rate:  [59-110] 76 (05/15 0700) Resp:  [18-22] 20 (05/15 0700) BP: (93-134)/(58-122) 93/58 mmHg (05/15 0700) SpO2:  [94 %-100 %] 94 % (05/15 0700) FiO2 (%):  [100 %] 100 % (05/14 1639) Weight:  [51.256 kg (113 lb)] 51.256 kg (113 lb) (05/14 2008) Weight change:     Intake/Output from previous day:    PHYSICAL EXAM General appearance: alert, no distress and slowed mentation Resp: diminished breath sounds bilaterally and rhonchi bilaterally Cardio: S1, S2 normal GI: soft, non-tender; bowel sounds normal; no masses,  no organomegaly Extremities: extremities normal, atraumatic, no cyanosis or edema  Lab Results:    @labtest @ [facility] No results found for this basename: PHART, PCO2, PO2ART, TCO2, HCO3,  in the last 72 hours CULTURES No results found for this or any previous visit (from the past 240 hour(s)). Studies/Results: Ct Angio Chest Pe W/cm &/or Wo Cm  05/15/2012   *RADIOLOGY REPORT*  Clinical Data: Syncope.  Shortness of breath.  CT ANGIOGRAPHY CHEST  Technique:  Multidetector CT imaging of the chest using the standard protocol during bolus administration of intravenous contrast. Multiplanar reconstructed images including MIPs were obtained and reviewed to evaluate the vascular anatomy.  Contrast: OMNIPAQUE IOHEXOL 300 MG/ML  SOLN  Comparison: CTA chest 08/09/2008.  Findings: Pulmonary arterial opacification is excellent.  There are no focal filling defects to suggest pulmonary emboli.  The ascending thoracic aorta is slightly larger than on the prior study, now measuring 47 or 48 mm compared with 46 mm. Atherosclerotic calcifications are present at the aortic arch and descending aorta  without aneurysm or rupture. The left adrenal adenoma is stable in size, measuring 11 mm in short axis.  Limited imaging of the upper abdomen is otherwise unremarkable.  Centrilobular emphysematous changes are present.  A small right pleural effusion is present.  Areas of linear atelectasis are present in the lower lobes bilaterally.  Age indeterminate fractures at T7, T9 with T10, T11, and T12 are new since the prior exam.  IMPRESSION:  1.  Multiple age indeterminate compression fractures from T7-C12. 2.  Linear and dependent atelectasis in the lower lobes bilaterally. 3.  No evidence for pulmonary embolus. 4.  Slight worsening of the ascending aortic aneurysm now measuring 47 or 48 mm compared with 46 mm previously.   Original Report Authenticated By: Marin Roberts, M.D.   Dg Chest Portable 1 View  05/15/2012   *RADIOLOGY REPORT*  Clinical Data: Loss of consciousness, COPD, prostate cancer  PORTABLE CHEST - 1 VIEW  Comparison: 05/02/2012  Findings: Chronic interstitial markings/emphysematous changes. Bilateral lower lobe scarring.  No focal consolidation.  No pleural effusion or pneumothorax.  The heart is normal in size.  Status post ORIF of the left mandible.  IMPRESSION: No evidence of acute cardiopulmonary disease.  Chronic interstitial markings/emphysematous changes with bilateral lower lobe scarring.   Original Report Authenticated By: Charline Bills, M.D.    Medications: I have reviewed the patient's current medications.  Assesment:  Active Problems:   Anemia   Syncope   Aspiration into respiratory tract   Hypoxia    Plan: Continue telemetry  IV antibiotics Carotid doppler Neurology consult    LOS: 1 day   Jeffrey Voth 05/16/2012, 8:33 AM

## 2012-05-17 ENCOUNTER — Observation Stay (HOSPITAL_COMMUNITY): Payer: Medicare Other

## 2012-05-17 LAB — BASIC METABOLIC PANEL
Calcium: 7.6 mg/dL — ABNORMAL LOW (ref 8.4–10.5)
Creatinine, Ser: 0.67 mg/dL (ref 0.50–1.35)
GFR calc Af Amer: >90 mL/min (ref >90)

## 2012-05-17 NOTE — Progress Notes (Signed)
Subjective: Patient is resting. His breathing is better. He is on Iv antibiotics. No fever of chills. No chest pain.  Objective: Vital signs in last 24 hours: Temp:  [97.9 F (36.6 C)-98.1 F (36.7 C)] 98.1 F (36.7 C) (05/16 0554) Pulse Rate:  [58-84] 58 (05/16 0554) Resp:  [16-18] 18 (05/16 0554) BP: (87-91)/(40-61) 89/59 mmHg (05/16 0554) SpO2:  [94 %-98 %] 94 % (05/16 0554) FiO2 (%):  [21 %] 21 % (05/15 1439) Weight change:  Last BM Date: 05/14/12  Intake/Output from previous day: 05/15 0701 - 05/16 0700 In: 3782.5 [P.O.:720; I.V.:2462.5; IV Piggyback:600] Out: 1200 [Urine:1200]  PHYSICAL EXAM General appearance: alert, no distress and slowed mentation Resp: diminished breath sounds bilaterally and rhonchi bilaterally Cardio: S1, S2 normal GI: soft, non-tender; bowel sounds normal; no masses,  no organomegaly Extremities: extremities normal, atraumatic, no cyanosis or edema  Lab Results:    @labtest @ [facility] No results found for this basename: PHART, PCO2, PO2ART, TCO2, HCO3,  in the last 72 hours CULTURES No results found for this or any previous visit (from the past 240 hour(s)). Studies/Results: Ct Angio Chest Pe W/cm &/or Wo Cm  05/15/2012   *RADIOLOGY REPORT*  Clinical Data: Syncope.  Shortness of breath.  CT ANGIOGRAPHY CHEST  Technique:  Multidetector CT imaging of the chest using the standard protocol during bolus administration of intravenous contrast. Multiplanar reconstructed images including MIPs were obtained and reviewed to evaluate the vascular anatomy.  Contrast: OMNIPAQUE IOHEXOL 300 MG/ML  SOLN  Comparison: CTA chest 08/09/2008.  Findings: Pulmonary arterial opacification is excellent.  There are no focal filling defects to suggest pulmonary emboli.  The ascending thoracic aorta is slightly larger than on the prior study, now measuring 47 or 48 mm compared with 46 mm. Atherosclerotic calcifications are present at the aortic arch and descending aorta  without aneurysm or rupture. The left adrenal adenoma is stable in size, measuring 11 mm in short axis.  Limited imaging of the upper abdomen is otherwise unremarkable.  Centrilobular emphysematous changes are present.  A small right pleural effusion is present.  Areas of linear atelectasis are present in the lower lobes bilaterally.  Age indeterminate fractures at T7, T9 with T10, T11, and T12 are new since the prior exam.  IMPRESSION:  1.  Multiple age indeterminate compression fractures from T7-C12. 2.  Linear and dependent atelectasis in the lower lobes bilaterally. 3.  No evidence for pulmonary embolus. 4.  Slight worsening of the ascending aortic aneurysm now measuring 47 or 48 mm compared with 46 mm previously.   Original Report Authenticated By: Marin Roberts, M.D.   Dg Chest Portable 1 View  05/15/2012   *RADIOLOGY REPORT*  Clinical Data: Loss of consciousness, COPD, prostate cancer  PORTABLE CHEST - 1 VIEW  Comparison: 05/02/2012  Findings: Chronic interstitial markings/emphysematous changes. Bilateral lower lobe scarring.  No focal consolidation.  No pleural effusion or pneumothorax.  The heart is normal in size.  Status post ORIF of the left mandible.  IMPRESSION: No evidence of acute cardiopulmonary disease.  Chronic interstitial markings/emphysematous changes with bilateral lower lobe scarring.   Original Report Authenticated By: Charline Bills, M.D.    Medications: I have reviewed the patient's current medications.  Assesment:  Active Problems:   Anemia   Syncope   Aspiration into respiratory tract   Hypoxia    Plan: Continue telemetry  IV antibiotics Carotid doppler Neurology consult pending    LOS: 2 days   Jermaine [name] 05/17/2012, 8:02 AM

## 2012-05-18 MED ORDER — AMOXICILLIN-POT CLAVULANATE 500-125 MG PO TABS: 1.0000 | ORAL_TABLET | Freq: Three times a day (TID) | ORAL | Status: DC

## 2012-05-18 NOTE — Discharge Summary (Signed)
Physician Discharge Summary  Patient ID: Jermaine [name] MRN: [id number] DOB/AGE: 1931-05-11 79 y.o. Primary Care Physician:Ebelyn Bohnet, MD Admit date: 05/15/2012 Discharge date: 05/18/2012    Discharge Diagnoses:   Active Problems:   Anemia   Syncope   Aspiration into respiratory tract   Hypoxia     Medication List    TAKE these medications       albuterol (2.5 MG/3ML) 0.083% nebulizer solution  Commonly known as:  PROVENTIL  Take 2.5 mg by nebulization every 6 (six) hours as needed. For shortness of breath     amoxicillin-clavulanate 500-125 MG per tablet  Commonly known as:  AUGMENTIN  Take 1 tablet (500 mg total) by mouth 3 (three) times daily.     aspirin 81 MG tablet  Take 81 mg by mouth daily.     baclofen 10 MG tablet  Commonly known as:  LIORESAL  Take 10 mg by mouth 2 (two) times daily.     folic acid 1 MG tablet  Commonly known as:  FOLVITE  Take 1 tablet (1 mg total) by mouth daily.     magnesium oxide 400 (241.3 MG) MG tablet  Commonly known as:  MAG-OX  Take 1 tablet (400 mg total) by mouth 2 (two) times daily.     multivitamin with minerals Tabs  Take 1 tablet by mouth daily.        Discharged Condition: improve    Consults: Neurology ( pending)  Significant Diagnostic Studies: Ct Angio Chest Pe W/cm &/or Wo Cm  05/15/2012   *RADIOLOGY REPORT*  Clinical Data: Syncope.  Shortness of breath.  CT ANGIOGRAPHY CHEST  Technique:  Multidetector CT imaging of the chest using the standard protocol during bolus administration of intravenous contrast. Multiplanar reconstructed images including MIPs were obtained and reviewed to evaluate the vascular anatomy.  Contrast: OMNIPAQUE IOHEXOL 300 MG/ML  SOLN  Comparison: CTA chest 08/09/2008.  Findings: Pulmonary arterial opacification is excellent.  There are no focal filling defects to suggest pulmonary emboli.  The ascending thoracic aorta is slightly larger than on the prior study, now measuring 47  or 48 mm compared with 46 mm. Atherosclerotic calcifications are present at the aortic arch and descending aorta without aneurysm or rupture. The left adrenal adenoma is stable in size, measuring 11 mm in short axis.  Limited imaging of the upper abdomen is otherwise unremarkable.  Centrilobular emphysematous changes are present.  A small right pleural effusion is present.  Areas of linear atelectasis are present in the lower lobes bilaterally.  Age indeterminate fractures at T7, T9 with T10, T11, and T12 are new since the prior exam.  IMPRESSION:  1.  Multiple age indeterminate compression fractures from T7-C12. 2.  Linear and dependent atelectasis in the lower lobes bilaterally. 3.  No evidence for pulmonary embolus. 4.  Slight worsening of the ascending aortic aneurysm now measuring 47 or 48 mm compared with 46 mm previously.   Original Report Authenticated By: Marin Roberts, M.D.   US Carotid Duplex Bilateral  05/17/2012   *RADIOLOGY REPORT*  Clinical Data: Syncope, history smoking  BILATERAL CAROTID DUPLEX ULTRASOUND  Technique: Gray scale imaging, color Doppler and duplex ultrasound was performed of bilateral carotid and vertebral arteries in the neck. Imaging done over 2 days, patient unable to complete imaging on the first day.  Comparison:  08/10/2008  Criteria:  Quantification of carotid stenosis is based on velocity parameters that correlate the residual internal carotid diameter with NASCET-based stenosis levels, using the diameter of the  distal internal carotid lumen as the denominator for stenosis measurement.  The following velocity measurements were obtained:                   PEAK SYSTOLIC/END DIASTOLIC RIGHT ICA:                        61/17cm/sec CCA:                        80/11cm/sec SYSTOLIC ICA/CCA RATIO:     0.76 DIASTOLIC ICA/CCA RATIO:    1.56 ECA:                        51cm/sec  LEFT ICA:                        59/18cm/sec CCA:                        56/9cm/sec SYSTOLIC ICA/CCA  RATIO:     1.04 DIASTOLIC ICA/CCA RATIO:    2.02 ECA:                        93cm/sec  Findings:  RIGHT CAROTID ARTERY: Intimal thickening right CCA.  Small amount of calcified shadowing plaque is seen at the right carotid bulb into the proximal right ICA.  Patient arrhythmic.  Mildly turbulent flow identified on color Doppler imaging of the right ICA, which is tortuous.  Spectral broadening right ICA on waveform analysis.  No high velocity jets.  RIGHT VERTEBRAL ARTERY:  Patent, antegrade  LEFT CAROTID ARTERY: Intimal thickening left CCA.  Small amount of calcified and shadowing plaque is seen at the left carotid bulb extending into proximal left ICA.  Mildly turbulent blood flow on color Doppler imaging with spectral broadening on waveform analysis within the tortuous left ICA.  No high velocity jets.  LEFT VERTEBRAL ARTERY:  Patent, antegrade  IMPRESSION:  Calcified atherosclerotic plaque formation bilaterally at the carotid bulbs extending into the proximal internal carotid arteries, associated with turbulent flow. Measured velocities correspond to a less than 50% diameter stenoses bilaterally. Patient arrhythmic during imaging.   Original Report Authenticated By: Ulyses Southward, M.D.   Dg Chest Portable 1 View  05/15/2012   *RADIOLOGY REPORT*  Clinical Data: Loss of consciousness, COPD, prostate cancer  PORTABLE CHEST - 1 VIEW  Comparison: 05/02/2012  Findings: Chronic interstitial markings/emphysematous changes. Bilateral lower lobe scarring.  No focal consolidation.  No pleural effusion or pneumothorax.  The heart is normal in size.  Status post ORIF of the left mandible.  IMPRESSION: No evidence of acute cardiopulmonary disease.  Chronic interstitial markings/emphysematous changes with bilateral lower lobe scarring.   Original Report Authenticated By: Charline Bills, M.D.   Dg Chest Port 1 View  05/02/2012   *RADIOLOGY REPORT*  Clinical Data: Hypoglycemia  PORTABLE CHEST - 1 VIEW  Comparison: 09/26/2010   Findings: Cardiomediastinal silhouette is stable.  Mild hyperinflation.  No pulmonary edema.  Streaky left base medially atelectasis or infiltrate.  IMPRESSION: Mild hyperinflation.  No pulmonary edema.  Streaky left base medially atelectasis or infiltrate.   Original Report Authenticated By: Natasha Mead, M.D.   Dg Abd 2 Views  05/02/2012   *RADIOLOGY REPORT*  Clinical Data: Nausea, vomiting, abdominal pain  ABDOMEN - 2 VIEW  Comparison: 06/08/2011  Findings: Nonspecific bowel gas pattern without disproportionate small bowel  dilatation to suggest small bowel obstruction.  No evidence of free air under the diaphragm on the upright view.  Degenerative changes of the visualized thoracolumbar spine. Vascular calcifications.  Status post ORIF of the left hip.  IMPRESSION: No evidence of small bowel obstruction or free air.   Original Report Authenticated By: Charline Bills, M.D.    Lab Results: Basic Metabolic Panel:  Recent Labs  09/81/19 0201 05/17/12 0518  NA 135 137  K 4.0 3.9  CL 102 104  CO2 28 27  GLUCOSE 76 70  BUN 5* 7  CREATININE 0.66 0.67  CALCIUM 7.9* 7.6*   Liver Function Tests: No results found for this basename: AST, ALT, ALKPHOS, BILITOT, PROT, ALBUMIN,  in the last 72 hours   CBC:  Recent Labs  05/15/12 1248 05/16/12 0201  WBC 5.7 3.7*  NEUTROABS 2.3  --   HGB 9.3* 8.2*  HCT 25.4* 22.6*  MCV 93.7 96.2  PLT 253 221    No results found for this or any previous visit (from the past 240 hour(s)).   Hospital Course:  This is a 79 years old male patient who was admitted due to hypoxemia and syncopal episode. [facility] and became hypoxic. He was treated with antibiotics empirically. Patient improved on oral antibiotics. Consult with neurology will made in out patient.  Discharge Exam: Blood pressure 105/66, pulse 63, temperature 97.8 F (36.6 C), temperature source Oral, resp. rate 18, height 5\' 10"  (1.778 m), weight  51.256 kg (113 lb), SpO2 92.00%.   Disposition:  Nursing home      Signed: Kamarri Fischetti   05/18/2012, 10:48 AM

## 2012-05-18 NOTE — Progress Notes (Signed)
Called report to [employer] at [facility].  Verbalized understanding.  Pt dc'd to facility via EMS. [place], Candelaria Stagers 05/18/2012

## 2012-06-14 ENCOUNTER — Ambulatory Visit: Payer: Medicare Other | Admitting: Urology

## 2012-06-18 ENCOUNTER — Other Ambulatory Visit (HOSPITAL_COMMUNITY): Payer: Self-pay | Admitting: Internal Medicine

## 2012-06-18 ENCOUNTER — Ambulatory Visit (HOSPITAL_COMMUNITY)
Admission: RE | Admit: 2012-06-18 | Discharge: 2012-06-18 | Disposition: A | Discharge status: Home / Self Care | Payer: Medicare Other | Source: Ambulatory Visit | Attending: Internal Medicine | Admitting: Internal Medicine

## 2012-06-18 DIAGNOSIS — R05 Cough: Secondary | ICD-10-CM | POA: Insufficient documentation

## 2012-06-18 DIAGNOSIS — R0989 Other specified symptoms and signs involving the circulatory and respiratory systems: Secondary | ICD-10-CM

## 2012-06-18 DIAGNOSIS — R079 Chest pain, unspecified: Secondary | ICD-10-CM | POA: Insufficient documentation

## 2012-06-18 DIAGNOSIS — J4489 Other specified chronic obstructive pulmonary disease: Secondary | ICD-10-CM | POA: Insufficient documentation

## 2012-06-18 DIAGNOSIS — J449 Chronic obstructive pulmonary disease, unspecified: Secondary | ICD-10-CM | POA: Insufficient documentation

## 2012-06-18 DIAGNOSIS — R059 Cough, unspecified: Secondary | ICD-10-CM | POA: Insufficient documentation

## 2012-07-18 ENCOUNTER — Emergency Department (HOSPITAL_COMMUNITY): Payer: Medicare Other

## 2012-07-18 ENCOUNTER — Emergency Department (HOSPITAL_COMMUNITY)
Admission: EM | Admit: 2012-07-18 | Discharge: 2012-07-19 | Disposition: A | Discharge status: Home / Self Care | Payer: Medicare Other | Attending: Emergency Medicine | Admitting: Emergency Medicine

## 2012-07-18 ENCOUNTER — Encounter (HOSPITAL_COMMUNITY): Payer: Self-pay | Admitting: *Deleted

## 2012-07-18 DIAGNOSIS — Z862 Personal history of diseases of the blood and blood-forming organs and certain disorders involving the immune mechanism: Secondary | ICD-10-CM | POA: Insufficient documentation

## 2012-07-18 DIAGNOSIS — Z8739 Personal history of other diseases of the musculoskeletal system and connective tissue: Secondary | ICD-10-CM | POA: Insufficient documentation

## 2012-07-18 DIAGNOSIS — F10920 Alcohol use, unspecified with intoxication, uncomplicated: Secondary | ICD-10-CM

## 2012-07-18 DIAGNOSIS — M25552 Pain in left hip: Secondary | ICD-10-CM

## 2012-07-18 DIAGNOSIS — Z7982 Long term (current) use of aspirin: Secondary | ICD-10-CM | POA: Insufficient documentation

## 2012-07-18 DIAGNOSIS — Z86718 Personal history of other venous thrombosis and embolism: Secondary | ICD-10-CM | POA: Insufficient documentation

## 2012-07-18 DIAGNOSIS — Z8781 Personal history of (healed) traumatic fracture: Secondary | ICD-10-CM | POA: Insufficient documentation

## 2012-07-18 DIAGNOSIS — E876 Hypokalemia: Secondary | ICD-10-CM

## 2012-07-18 DIAGNOSIS — F1010 Alcohol abuse, uncomplicated: Secondary | ICD-10-CM | POA: Insufficient documentation

## 2012-07-18 DIAGNOSIS — J449 Chronic obstructive pulmonary disease, unspecified: Secondary | ICD-10-CM | POA: Insufficient documentation

## 2012-07-18 DIAGNOSIS — M25559 Pain in unspecified hip: Secondary | ICD-10-CM | POA: Insufficient documentation

## 2012-07-18 DIAGNOSIS — Z8639 Personal history of other endocrine, nutritional and metabolic disease: Secondary | ICD-10-CM | POA: Insufficient documentation

## 2012-07-18 DIAGNOSIS — Z8659 Personal history of other mental and behavioral disorders: Secondary | ICD-10-CM | POA: Insufficient documentation

## 2012-07-18 DIAGNOSIS — D649 Anemia, unspecified: Secondary | ICD-10-CM | POA: Insufficient documentation

## 2012-07-18 DIAGNOSIS — J4489 Other specified chronic obstructive pulmonary disease: Secondary | ICD-10-CM | POA: Insufficient documentation

## 2012-07-18 DIAGNOSIS — Z8546 Personal history of malignant neoplasm of prostate: Secondary | ICD-10-CM | POA: Insufficient documentation

## 2012-07-18 DIAGNOSIS — Z8701 Personal history of pneumonia (recurrent): Secondary | ICD-10-CM | POA: Insufficient documentation

## 2012-07-18 DIAGNOSIS — F17200 Nicotine dependence, unspecified, uncomplicated: Secondary | ICD-10-CM | POA: Insufficient documentation

## 2012-07-18 DIAGNOSIS — Z79899 Other long term (current) drug therapy: Secondary | ICD-10-CM | POA: Insufficient documentation

## 2012-07-18 DIAGNOSIS — G8929 Other chronic pain: Secondary | ICD-10-CM | POA: Insufficient documentation

## 2012-07-18 LAB — COMPREHENSIVE METABOLIC PANEL
AST: 18 U/L (ref 0–37)
Albumin: 2.9 g/dL — ABNORMAL LOW (ref 3.5–5.2)
Calcium: 8.7 mg/dL (ref 8.4–10.5)
Creatinine, Ser: 0.78 mg/dL (ref 0.50–1.35)
Total Protein: 6.5 g/dL (ref 6.0–8.3)

## 2012-07-18 LAB — CBC WITH DIFFERENTIAL/PLATELET
Basophils Absolute: 0.1 10*3/uL (ref 0.0–0.1)
Basophils Relative: 2 % — ABNORMAL HIGH (ref 0–1)
Eosinophils Relative: 4 % (ref 0–5)
Lymphocytes Relative: 61 % — ABNORMAL HIGH (ref 12–46)
MCHC: 34.8 g/dL (ref 30.0–36.0)
Neutro Abs: 1.2 10*3/uL — ABNORMAL LOW (ref 1.7–7.7)
Platelets: 277 10*3/uL (ref 150–400)
RDW: 15.5 % (ref 11.5–15.5)
WBC: 4.3 10*3/uL (ref 4.0–10.5)

## 2012-07-18 MED ORDER — SODIUM CHLORIDE 0.9 % IV SOLN
INTRAVENOUS | Status: DC
  Administered 2012-07-18: 21:00:00 via INTRAVENOUS

## 2012-07-18 NOTE — ED Provider Notes (Signed)
History    CSN: [id number] Arrival date & time 07/18/12  2005  First MD Initiated Contact with Patient 07/18/12 2028     Chief Complaint  Patient presents with  . Hip Pain  . Alcohol Intoxication   Level V caveat for alcohol intoxication  (Consider location/radiation/quality/duration/timing/severity/associated sxs/prior Treatment) HPI When asked why he is here patient states "I'm down". When asked what that means he states he is weak. He states he is on a "halfway" eating. He does not have his lost weight. He denies any nausea, vomiting, diarrhea, headache, chest pain, or abdominal pain. He states he has no energy he just lays around. He states 2 years ago he had hip replacement and he has pain when he "turns over on it".  PCP none  Past Medical History  Diagnosis Date  . COPD (chronic obstructive pulmonary disease)   . Chronic hip pain     Avascular necrosis, bilateral hips.  Marland Kitchen Spinal stenosis   . DVT (deep venous thrombosis)   . PE (pulmonary embolism) May/2012.  Marland Kitchen Alcohol abuse   . Elevated PSA     Lost to followup.  . Macrocytic anemia   . Lytic lesion of bone on x-ray May/2012.    Negative bone scan.  . Bilateral pneumonia May/2012.  Marland Kitchen Thoracic compression fracture   . Tobacco abuse   . Alcohol withdrawal syndrome 06/10/2011  . Delirium, drug-induced 06/2011    From Ativan  . Abnormal thyroid function test 06/2011.    Normal TSH and slightly low free T4.  Marland Kitchen Hypoglycemia   . Dysphagia   . Syncope and collapse   . Hematuria   . Spinal stenosis   . Cancer     malignant neoplasm of prostate  . Venous thrombosis   . 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 Total hip arthroplasty      Left hip.  . Tonsillectomy      as child  . Cystoscopy  10/17/2011    Procedure: CYSTOSCOPY;  Surgeon: Anner Crete, MD;  Location: [facility];  Service: Urology;  Laterality: N/A;  Cystoscopy/Orchiectomy/Transrectal Ultrasound  Guided Prostate Biopsy  . Orchiectomy  10/17/2011    Procedure: ORCHIECTOMY;  Surgeon: Anner Crete, MD;  Location: [facility];  Service: Urology;  Laterality: Bilateral;  . Prostate biopsy  10/17/2011    Procedure: BIOPSY TRANSRECTAL ULTRASONIC PROSTATE (TUBP);  Surgeon: Anner Crete, MD;  Location: [facility];  Service: Urology;  Laterality: N/A;   No family history on file. History  Substance Use Topics  . Smoking status: Current Every Day Smoker -- 0.50 packs/day for 60 years    Types: Cigarettes  . Smokeless tobacco: Not on file  . Alcohol Use: 24.0 oz/week    40 Shots of liquor per week     Comment: diagnosed wtih etoh intoxication 05/10/12   states he lives at home States he lives alone States he drinks "2 cups of gin a day" Smokes half pack a day  Review of Systems  Unable to perform ROS: Other    Allergies  Review of patient's allergies indicates no known allergies.  Home Medications   Current Outpatient Rx  Name  Route  Sig  Dispense  Refill  . albuterol (PROVENTIL) (2.5 MG/3ML) 0.083% nebulizer solution   Nebulization   Take 2.5 mg by nebulization every 6 (six) hours as needed. For shortness of breath         .  amoxicillin-clavulanate (AUGMENTIN) 500-125 MG per tablet   Oral   Take 1 tablet (500 mg total) by mouth 3 (three) times daily.   15 tablet   0   . aspirin 81 MG tablet   Oral   Take 81 mg by mouth daily.         . baclofen (LIORESAL) 10 MG tablet   Oral   Take 10 mg by mouth 2 (two) times daily.         . folic acid (FOLVITE) 1 MG tablet   Oral   Take 1 tablet (1 mg total) by mouth daily.   30 tablet   3   . magnesium oxide (MAG-OX) 400 (241.3 MG) MG tablet   Oral   Take 1 tablet (400 mg total) by mouth 2 (two) times daily.   60 tablet   3   . Multiple Vitamin (MULTIVITAMIN WITH MINERALS) TABS   Oral   Take 1 tablet by mouth daily.   30 tablet   3    BP 111/72  Pulse 88  Temp(Src) 98.2 F (36.8 C) (Oral)  Resp 16  Ht 5\' 10"   (1.778 m)  Wt 113 lb (51.256 kg)  BMI 16.21 kg/m2  SpO2 97%  Vital signs normal   Physical Exam  Nursing note and vitals reviewed. Constitutional: He is oriented to person, place, and time.  Non-toxic appearance. He does not appear ill. No distress.  Thin, frail, elderly male  HENT:  Head: Normocephalic and atraumatic.  Right Ear: External ear normal.  Left Ear: External ear normal.  Nose: Nose normal. No mucosal edema or rhinorrhea.  Mouth/Throat: Mucous membranes are normal. No dental abscesses or edematous.  Tongue dry, patient only has a few lower teeth that are in poor condition, his speech is extremely hard to understand  Eyes: Conjunctivae and EOM are normal. Pupils are equal, round, and reactive to light.  Neck: Normal range of motion and full passive range of motion without pain. Neck supple.  Cardiovascular: Normal rate, regular rhythm and normal heart sounds.  Exam reveals no gallop and no friction rub.   No murmur heard. Pulmonary/Chest: Effort normal and breath sounds normal. No respiratory distress. He has no wheezes. He has no rhonchi. He has no rales. He exhibits no tenderness and no crepitus.  Abdominal: Soft. Normal appearance and bowel sounds are normal. He exhibits no distension. There is no tenderness. There is no rebound and no guarding.  Musculoskeletal: Normal range of motion. He exhibits no edema and no tenderness.       Back:  Moves all extremities well. Patient's complaint of hip pain is in the indicated area. He's noted to prefer to keep his knees and his hips flexed.  Neurological: He is alert and oriented to person, place, and time. He has normal strength. No cranial nerve deficit.  Skin: Skin is warm, dry and intact. No rash noted. No erythema. No pallor.  Psychiatric: His affect is labile.  Patient is very irritable and does not want to answer questions.    ED Course  Procedures (including critical care time)  Medications  0.9 %  sodium chloride  infusion ( Intravenous New Bag/Given 07/18/12 2122)    21:10 Pt turned over at change of shift to Dr Hyacinth Meeker to get xray results and to disposition patient.   Results for orders placed during the hospital encounter of 07/18/12  CBC WITH DIFFERENTIAL      Result Value Range   WBC 4.3  4.0 -  10.5 K/uL   RBC 3.07 (*) 4.22 - 5.81 MIL/uL   Hemoglobin 9.6 (*) 13.0 - 17.0 g/dL   HCT 40.9 (*) 81.1 - 91.4 %   MCV 89.9  78.0 - 100.0 fL   MCH 31.3  26.0 - 34.0 pg   MCHC 34.8  30.0 - 36.0 g/dL   RDW 78.2  95.6 - 21.3 %   Platelets 277  150 - 400 K/uL   Neutrophils Relative % 27 (*) 43 - 77 %   Neutro Abs 1.2 (*) 1.7 - 7.7 K/uL   Lymphocytes Relative 61 (*) 12 - 46 %   Lymphs Abs 2.6  0.7 - 4.0 K/uL   Monocytes Relative 6  3 - 12 %   Monocytes Absolute 0.3  0.1 - 1.0 K/uL   Eosinophils Relative 4  0 - 5 %   Eosinophils Absolute 0.2  0.0 - 0.7 K/uL   Basophils Relative 2 (*) 0 - 1 %   Basophils Absolute 0.1  0.0 - 0.1 K/uL  COMPREHENSIVE METABOLIC PANEL      Result Value Range   Sodium 143  135 - 145 mEq/L   Potassium 3.2 (*) 3.5 - 5.1 mEq/L   Chloride 109  96 - 112 mEq/L   CO2 22  19 - 32 mEq/L   Glucose, Bld 86  70 - 99 mg/dL   BUN 18  6 - 23 mg/dL   Creatinine, Ser 0.86  0.50 - 1.35 mg/dL   Calcium 8.7  8.4 - 57.8 mg/dL   Total Protein 6.5  6.0 - 8.3 g/dL   Albumin 2.9 (*) 3.5 - 5.2 g/dL   AST 18  0 - 37 U/L   ALT 5  0 - 53 U/L   Alkaline Phosphatase 85  39 - 117 U/L   Total Bilirubin 0.1 (*) 0.3 - 1.2 mg/dL   GFR calc non Af Amer 84 (*) >90 mL/min   GFR calc Af Amer >90  >90 mL/min  ETHANOL      Result Value Range   Alcohol, Ethyl (B) 305 (*) 0 - 11 mg/dL   Laboratory interpretation all normal except alcohol intoxication, hypokalemia, anemia  Xrays pending at change of shift.     1. Alcohol intoxication, uncomplicated   2. Anemia   3. Hypokalemia   4. Hip pain, left     Disposition pending   Devoria Albe, MD, FACEP   MDM    Ward Givens, MD 07/18/12 2209

## 2012-07-18 NOTE — ED Notes (Signed)
Pt received from [facility] secondary to left hip pain and ETOH abuse.  Pt drank approximately  375 mls straight  Gin, per EMS. Family reported to EMS they want him placed in a nursing home. Pt is visibly intoxicated, slurring his words. NAD noted at this time.

## 2012-07-18 NOTE — ED Notes (Signed)
Pt received from EMS secondary to ETOcH abuse and left hip pain. Pt reports has a plate in said hip and feels the screws are coming out.

## 2012-07-19 MED ORDER — POTASSIUM CHLORIDE 20 MEQ PO PACK
40.0000 meq | PACK | Freq: Once | ORAL | Status: AC
  Administered 2012-07-19: 40 meq via ORAL
  Filled 2012-07-19: qty 2

## 2012-07-19 NOTE — ED Notes (Signed)
Waking up in a confrontational mood. Cursive and abusive to staff. "nobody gives a damn about anyone anymore" "your gona throw me Arlan Organ here with no place to go"  Attempts to reason w/pt unsuccessful. Informed pt that his family would be back @ 8am. We have already ordered him some breakfast.  Pt's response "I don't give a damn what you do!"

## 2012-07-19 NOTE — ED Notes (Signed)
Left in c/o family for transport home - instructions and f/u information given/reviewed with family. Verbalizes understanding.

## 2012-07-19 NOTE — ED Notes (Signed)
sleeping

## 2012-07-19 NOTE — ED Notes (Signed)
Attempted to reason with patient to get him to eat, pt states that he is disgusted with [place] because we are just going to kick him out of here at 8 o'clock, MD made aware and family will be consulted on arrival to the ED.

## 2012-07-19 NOTE — ED Notes (Signed)
Family here, pt awake , states it's OK to talk to them about his condition "I ain't got no secrets here". Family states that he lives alone, was recently discharged from [facility] because he wanted to leave to have "control over his money". Family does come by his home every day to check on him. Took him to his doctors appt yesterday. Noticed that his speech was thick and he started getting disagreeable with PMD. Family aware that pt does drink but is surprised at current blood alcohol level. Pt currently does not want to leave here. "I ain't got no home, can't you just put me somewhere?" Plan with family now is for them to go home and return @ 8am and reevaluate pt status when sober.

## 2012-09-18 ENCOUNTER — Emergency Department (HOSPITAL_COMMUNITY): Payer: Medicare Other

## 2012-09-18 ENCOUNTER — Encounter (HOSPITAL_COMMUNITY): Payer: Self-pay | Admitting: Emergency Medicine

## 2012-09-18 ENCOUNTER — Inpatient Hospital Stay (HOSPITAL_COMMUNITY)
Admission: EM | Admit: 2012-09-18 | Discharge: 2012-09-26 | DRG: 689 | Disposition: A | Discharge status: Home Health | Payer: Medicare Other | Attending: Internal Medicine | Admitting: Internal Medicine

## 2012-09-18 DIAGNOSIS — F1020 Alcohol dependence, uncomplicated: Secondary | ICD-10-CM | POA: Diagnosis present

## 2012-09-18 DIAGNOSIS — D649 Anemia, unspecified: Secondary | ICD-10-CM

## 2012-09-18 DIAGNOSIS — Z720 Tobacco use: Secondary | ICD-10-CM

## 2012-09-18 DIAGNOSIS — J441 Chronic obstructive pulmonary disease with (acute) exacerbation: Secondary | ICD-10-CM | POA: Diagnosis present

## 2012-09-18 DIAGNOSIS — Z86711 Personal history of pulmonary embolism: Secondary | ICD-10-CM

## 2012-09-18 DIAGNOSIS — A529 Late syphilis, unspecified: Secondary | ICD-10-CM | POA: Diagnosis present

## 2012-09-18 DIAGNOSIS — Z681 Body mass index (BMI) 19 or less, adult: Secondary | ICD-10-CM

## 2012-09-18 DIAGNOSIS — Z86718 Personal history of other venous thrombosis and embolism: Secondary | ICD-10-CM

## 2012-09-18 DIAGNOSIS — D62 Acute posthemorrhagic anemia: Secondary | ICD-10-CM | POA: Diagnosis present

## 2012-09-18 DIAGNOSIS — R0902 Hypoxemia: Secondary | ICD-10-CM

## 2012-09-18 DIAGNOSIS — K449 Diaphragmatic hernia without obstruction or gangrene: Secondary | ICD-10-CM | POA: Diagnosis present

## 2012-09-18 DIAGNOSIS — I10 Essential (primary) hypertension: Secondary | ICD-10-CM | POA: Diagnosis present

## 2012-09-18 DIAGNOSIS — D638 Anemia in other chronic diseases classified elsewhere: Secondary | ICD-10-CM | POA: Diagnosis present

## 2012-09-18 DIAGNOSIS — Z8546 Personal history of malignant neoplasm of prostate: Secondary | ICD-10-CM

## 2012-09-18 DIAGNOSIS — M4800 Spinal stenosis, site unspecified: Secondary | ICD-10-CM | POA: Diagnosis present

## 2012-09-18 DIAGNOSIS — Z87440 Personal history of urinary (tract) infections: Secondary | ICD-10-CM

## 2012-09-18 DIAGNOSIS — F17200 Nicotine dependence, unspecified, uncomplicated: Secondary | ICD-10-CM | POA: Diagnosis present

## 2012-09-18 DIAGNOSIS — K264 Chronic or unspecified duodenal ulcer with hemorrhage: Secondary | ICD-10-CM | POA: Diagnosis present

## 2012-09-18 DIAGNOSIS — D509 Iron deficiency anemia, unspecified: Secondary | ICD-10-CM | POA: Diagnosis present

## 2012-09-18 DIAGNOSIS — E43 Unspecified severe protein-calorie malnutrition: Secondary | ICD-10-CM | POA: Diagnosis present

## 2012-09-18 DIAGNOSIS — I951 Orthostatic hypotension: Secondary | ICD-10-CM | POA: Diagnosis present

## 2012-09-18 DIAGNOSIS — E860 Dehydration: Secondary | ICD-10-CM

## 2012-09-18 DIAGNOSIS — N390 Urinary tract infection, site not specified: Principal | ICD-10-CM | POA: Diagnosis present

## 2012-09-18 DIAGNOSIS — F10929 Alcohol use, unspecified with intoxication, unspecified: Secondary | ICD-10-CM

## 2012-09-18 DIAGNOSIS — Z23 Encounter for immunization: Secondary | ICD-10-CM

## 2012-09-18 DIAGNOSIS — Z96649 Presence of unspecified artificial hip joint: Secondary | ICD-10-CM

## 2012-09-18 HISTORY — DX: Other chronic pain: G89.29

## 2012-09-18 HISTORY — DX: Dorsalgia, unspecified: M54.9

## 2012-09-18 LAB — CBC WITH DIFFERENTIAL/PLATELET
Eosinophils Absolute: 0.2 10*3/uL (ref 0.0–0.7)
Eosinophils Relative: 4 % (ref 0–5)
Lymphs Abs: 3.6 10*3/uL (ref 0.7–4.0)
MCH: 29.2 pg (ref 26.0–34.0)
MCV: 87.3 fL (ref 78.0–100.0)
Platelets: 246 10*3/uL (ref 150–400)
RBC: 3.22 MIL/uL — ABNORMAL LOW (ref 4.22–5.81)

## 2012-09-18 LAB — URINALYSIS W MICROSCOPIC + REFLEX CULTURE
Ketones, ur: NEGATIVE mg/dL
Nitrite: POSITIVE — AB
Specific Gravity, Urine: 1.015 (ref 1.005–1.030)
pH: 7 (ref 5.0–8.0)

## 2012-09-18 LAB — BASIC METABOLIC PANEL
BUN: 24 mg/dL — ABNORMAL HIGH (ref 6–23)
Calcium: 9 mg/dL (ref 8.4–10.5)
GFR calc non Af Amer: 81 mL/min — ABNORMAL LOW (ref >90)
Glucose, Bld: 84 mg/dL (ref 70–99)
Sodium: 142 mEq/L (ref 135–145)

## 2012-09-18 LAB — GLUCOSE, CAPILLARY: Glucose-Capillary: 89 mg/dL (ref 70–99)

## 2012-09-18 LAB — MAGNESIUM: Magnesium: 1.8 mg/dL (ref 1.5–2.5)

## 2012-09-18 LAB — RETICULOCYTES: Retic Count, Absolute: 36.4 10*3/uL (ref 19.0–186.0)

## 2012-09-18 LAB — CBC
MCHC: 33.7 g/dL (ref 30.0–36.0)
Platelets: 259 10*3/uL (ref 150–400)
RDW: 18.4 % — ABNORMAL HIGH (ref 11.5–15.5)

## 2012-09-18 LAB — TROPONIN I: Troponin I: <0.3 ng/mL (ref <0.30)

## 2012-09-18 MED ORDER — CEFTRIAXONE SODIUM 1 G IJ SOLR
INTRAMUSCULAR | Status: AC
  Filled 2012-09-18: qty 10

## 2012-09-18 MED ORDER — LORAZEPAM 1 MG PO TABS: 0.5000 mg | ORAL_TABLET | Freq: Four times a day (QID) | ORAL | Status: DC | PRN

## 2012-09-18 MED ORDER — SODIUM CHLORIDE 0.9 % IV SOLN
INTRAVENOUS | Status: DC
  Administered 2012-09-18 – 2012-09-25 (×8): via INTRAVENOUS

## 2012-09-18 MED ORDER — IPRATROPIUM BROMIDE 0.02 % IN SOLN
0.5000 mg | RESPIRATORY_TRACT | Status: DC
  Administered 2012-09-18 – 2012-09-19 (×5): 0.5 mg via RESPIRATORY_TRACT
  Filled 2012-09-18 (×5): qty 2.5

## 2012-09-18 MED ORDER — THIAMINE HCL 100 MG/ML IJ SOLN
100.0000 mg | Freq: Every day | INTRAMUSCULAR | Status: AC
  Administered 2012-09-18 – 2012-09-20 (×3): 100 mg via INTRAVENOUS
  Filled 2012-09-18 (×3): qty 2

## 2012-09-18 MED ORDER — DEXTROSE 5 % IV SOLN
1.0000 g | Freq: Once | INTRAVENOUS | Status: AC
  Administered 2012-09-18: 1 g via INTRAVENOUS
  Filled 2012-09-18: qty 10

## 2012-09-18 MED ORDER — LORAZEPAM 0.5 MG PO TABS
0.5000 mg | ORAL_TABLET | Freq: Four times a day (QID) | ORAL | Status: DC | PRN
  Administered 2012-09-20: 0.5 mg via ORAL
  Filled 2012-09-18: qty 1

## 2012-09-18 MED ORDER — ALBUTEROL SULFATE (5 MG/ML) 0.5% IN NEBU
2.5000 mg | INHALATION_SOLUTION | RESPIRATORY_TRACT | Status: DC
  Administered 2012-09-18 – 2012-09-19 (×5): 2.5 mg via RESPIRATORY_TRACT
  Filled 2012-09-18 (×5): qty 0.5

## 2012-09-18 MED ORDER — DEXTROSE 5 % IV SOLN
1.0000 g | INTRAVENOUS | Status: DC
  Administered 2012-09-18 – 2012-09-25 (×7): 1 g via INTRAVENOUS
  Filled 2012-09-18 (×9): qty 10

## 2012-09-18 MED ORDER — SODIUM CHLORIDE 0.9 % IV BOLUS (SEPSIS): 250.0000 mL | Freq: Once | INTRAVENOUS | Status: DC

## 2012-09-18 MED ORDER — SODIUM CHLORIDE 0.9 % IV SOLN: INTRAVENOUS | Status: DC

## 2012-09-18 MED ORDER — PANTOPRAZOLE SODIUM 40 MG PO TBEC
40.0000 mg | DELAYED_RELEASE_TABLET | Freq: Every day | ORAL | Status: DC
  Administered 2012-09-19 – 2012-09-23 (×5): 40 mg via ORAL
  Filled 2012-09-18 (×5): qty 1

## 2012-09-18 MED ORDER — ENOXAPARIN SODIUM 40 MG/0.4ML ~~LOC~~ SOLN
40.0000 mg | Freq: Every day | SUBCUTANEOUS | Status: DC
  Administered 2012-09-18 – 2012-09-23 (×6): 40 mg via SUBCUTANEOUS
  Filled 2012-09-18 (×6): qty 0.4

## 2012-09-18 MED ORDER — ONDANSETRON HCL 4 MG/2ML IJ SOLN: 4.0000 mg | Freq: Four times a day (QID) | INTRAMUSCULAR | Status: DC | PRN

## 2012-09-18 NOTE — Progress Notes (Signed)
Pharmacy Consult: Lovenox for VTE prophylaxis.  Patient Measurements: Height: 5\' 11"  (180.3 cm) Weight: 120 lb (54.432 kg) IBW/kg (Calculated) : 75.3 Body mass index is 16.74 kg/(m^2).   VITALS Filed Vitals:   09/18/12 1950  BP:   Pulse:   Temp: 98.9 F (37.2 C)  Resp:     INR Last Three Days: No results found for this basename: INR,  in the last 72 hours  CBC:    Component Value Date/Time   WBC 5.7 09/18/2012 1852   RBC 3.22* 09/18/2012 1852   RBC 2.43* 05/03/2012 0827   HGB 9.4* 09/18/2012 1852   HCT 28.1* 09/18/2012 1852   PLT 246 09/18/2012 1852   MCV 87.3 09/18/2012 1852   MCH 29.2 09/18/2012 1852   MCHC 33.5 09/18/2012 1852   RDW 18.4* 09/18/2012 1852   LYMPHSABS 3.6 09/18/2012 1852   MONOABS 0.2 09/18/2012 1852   EOSABS 0.2 09/18/2012 1852   BASOSABS 0.1 09/18/2012 1852    RENAL FUNCTION: Estimated Creatinine Clearance: 53.8 ml/min (by C-G formula based on Cr of 0.87).  Assessment: Dose stable for age, weight, renal function and indication.  Plan: Lovenox 40mg  sq daily. Sign off.  Mady Gemma, [facility] 09/18/2012 9:22 PM

## 2012-09-18 NOTE — ED Provider Notes (Signed)
CSN: [id number]     Arrival date & time 09/18/12  1808 History   First MD Initiated Contact with Patient 09/18/12 1821     Chief Complaint  Patient presents with  . Shortness of Breath  . Hypotension    Patient is a 79 y.o. male presenting with shortness of breath. The history is provided by the EMS personnel and the patient. The history is limited by the condition of the patient (alcohol intoxication).  Shortness of Breath Pt was seen at 1840.  Per EMS and pt report, pt called EMS for SOB, cough and "dizziness" for the past 1 day. EMS states on their arrival to scene, pt's O2 Sats 97% R/A, SBP 90's, CBG 79. Pt denies any other complaints. Endorses hx of chronic daily etoh use.    Past Medical History  Diagnosis Date  . COPD (chronic obstructive pulmonary disease)   . Chronic hip pain     Avascular necrosis, bilateral hips.  . DVT (deep venous thrombosis)   . PE (pulmonary embolism) May/2012.  Marland Kitchen Alcohol abuse   . Elevated PSA     Lost to followup.  . Macrocytic anemia   . Lytic lesion of bone on x-ray May/2012.    Negative bone scan.  . Bilateral pneumonia May/2012.  Marland Kitchen Thoracic compression fracture   . Tobacco abuse   . Alcohol withdrawal syndrome 06/10/2011  . Delirium, drug-induced 06/2011    From Ativan  . Abnormal thyroid function test 06/2011.    Normal TSH and slightly low free T4.  Marland Kitchen Hypoglycemia   . Dysphagia   . Syncope and collapse   . Hematuria   . Spinal stenosis   . Cancer     malignant neoplasm of prostate  . Venous thrombosis   . 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 Chronic back pain    Past Surgical History  Procedure Laterality Date  . Total hip arthroplasty      Left hip.  . Tonsillectomy      as child  . Cystoscopy  10/17/2011    Procedure: CYSTOSCOPY;  Surgeon: Anner Crete, MD;  Location: [facility];  Service: Urology;  Laterality: N/A;  Cystoscopy/Orchiectomy/Transrectal Ultrasound Guided Prostate Biopsy  . Orchiectomy  10/17/2011     Procedure: ORCHIECTOMY;  Surgeon: Anner Crete, MD;  Location: [facility];  Service: Urology;  Laterality: Bilateral;  . Prostate biopsy  10/17/2011    Procedure: BIOPSY TRANSRECTAL ULTRASONIC PROSTATE (TUBP);  Surgeon: Anner Crete, MD;  Location: [facility];  Service: Urology;  Laterality: N/A;    History  Substance Use Topics  . Smoking status: Current Every Day Smoker -- 0.50 packs/day for 60 years    Types: Cigarettes  . Smokeless tobacco: Never Used  . Alcohol Use: 24.0 oz/week    40 Shots of liquor per week     Comment: diagnosed wtih etoh intoxication 05/10/12    Review of Systems  Unable to perform ROS: Other  Respiratory: Positive for shortness of breath.        Allergies  Review of patient's allergies indicates no known allergies.  Home Medications   Current Outpatient Rx  Name  Route  Sig  Dispense  Refill  . Acetaminophen 500 MG coapsule   Oral   Take 500 mg by mouth as needed for fever or pain.         Marland Kitchen albuterol (PROVENTIL) (2.5 MG/3ML) 0.083% nebulizer solution   Nebulization   Take 2.5 mg by nebulization  every 6 (six) hours as needed. For shortness of breath          BP 94/54  Pulse 88  Temp(Src) 98.9 F (37.2 C) (Rectal)  Resp 20  Ht 5\' 11"  (1.803 m)  Wt 120 lb (54.432 kg)  BMI 16.74 kg/m2  SpO2 97% Filed Vitals:   09/18/12 1818 09/18/12 1847 09/18/12 1847 09/18/12 1950  BP: 121/76 107/70 94/54   Pulse: 81 79 88   Temp: 98.4 F (36.9 C)   98.9 F (37.2 C)  TempSrc: Oral   Rectal  Resp: 20     Height: 5\' 11"  (1.803 m)     Weight: 120 lb (54.432 kg)     SpO2: 97%       Physical Exam 1845: Physical examination:  Nursing notes reviewed; Vital signs and O2 SAT reviewed;  Constitutional: Well developed, Well nourished, In no acute distress; Head:  Normocephalic, atraumatic; Eyes: EOMI, PERRL, No scleral icterus; ENMT: Mouth and pharynx normal, Mucous membranes dry; Neck: Supple, Full range of motion, No lymphadenopathy; Cardiovascular: Regular  rate and rhythm, No gallop; Respiratory: Breath sounds clear & equal bilaterally, No wheezes.  Speaking full sentences with ease, Normal respiratory effort/excursion; Chest: Nontender, Movement normal; Abdomen: Soft, Nontender, Nondistended, Normal bowel sounds; Genitourinary: No CVA tenderness; Extremities: Pulses normal, No tenderness, No edema, No calf edema or asymmetry.; Neuro: Awake, alert, intoxicated. Major CN grossly intact. No facial droop. Speech clear. Moves all extremities spontaneously without apparent gross focal motor deficits.; Skin: Color normal, Warm, Dry.; Psych:  Easily agitated and argumentative.    ED Course  Procedures     MDM  MDM Reviewed: previous chart, nursing note and vitals Reviewed previous: labs Interpretation: labs, ECG and x-ray      Date: 09/18/2012  Rate: 81  Rhythm: normal sinus rhythm  QRS Axis: left  Intervals: PR prolonged  ST/T Wave abnormalities: nonspecific T wave changes, TWI lateral leads  Conduction Disutrbances:first-degree A-V block  and left anterior fascicular block  Narrative Interpretation:   Old EKG Reviewed: unchanged; no significant changes from previous EKG dated 05/15/2012.  Results for orders placed during the hospital encounter of 09/18/12  GLUCOSE, CAPILLARY      Result Value Range   Glucose-Capillary 89  70 - 99 mg/dL  CBC WITH DIFFERENTIAL      Result Value Range   WBC 5.7  4.0 - 10.5 K/uL   RBC 3.22 (*) 4.22 - 5.81 MIL/uL   Hemoglobin 9.4 (*) 13.0 - 17.0 g/dL   HCT 95.6 (*) 21.3 - 08.6 %   MCV 87.3  78.0 - 100.0 fL   MCH 29.2  26.0 - 34.0 pg   MCHC 33.5  30.0 - 36.0 g/dL   RDW 57.8 (*) 46.9 - 62.9 %   Platelets 246  150 - 400 K/uL   Neutrophils Relative % 30 (*) 43 - 77 %   Neutro Abs 1.7  1.7 - 7.7 K/uL   Lymphocytes Relative 62 (*) 12 - 46 %   Lymphs Abs 3.6  0.7 - 4.0 K/uL   Monocytes Relative 4  3 - 12 %   Monocytes Absolute 0.2  0.1 - 1.0 K/uL   Eosinophils Relative 4  0 - 5 %   Eosinophils Absolute  0.2  0.0 - 0.7 K/uL   Basophils Relative 1  0 - 1 %   Basophils Absolute 0.1  0.0 - 0.1 K/uL  BASIC METABOLIC PANEL      Result Value Range   Sodium 142  135 - 145 mEq/L   Potassium 3.7  3.5 - 5.1 mEq/L   Chloride 107  96 - 112 mEq/L   CO2 23  19 - 32 mEq/L   Glucose, Bld 84  70 - 99 mg/dL   BUN 24 (*) 6 - 23 mg/dL   Creatinine, Ser 1.61  0.50 - 1.35 mg/dL   Calcium 9.0  8.4 - 09.6 mg/dL   GFR calc non Af Amer 81 (*) >90 mL/min   GFR calc Af Amer >90  >90 mL/min  LACTIC ACID, PLASMA      Result Value Range   Lactic Acid, Venous 3.0 (*) 0.5 - 2.2 mmol/L  TROPONIN I      Result Value Range   Troponin I <0.30  <0.30 ng/mL  ETHANOL      Result Value Range   Alcohol, Ethyl (B) 207 (*) 0 - 11 mg/dL  PRO B NATRIURETIC PEPTIDE      Result Value Range   Pro B Natriuretic peptide (BNP) 145.9  0 - 450 pg/mL   Results for NAVON, KOTOWSKI (MRN [id number]) as of 09/18/2012 20:12  Ref. Range 05/10/2012 05:48 05/15/2012 12:48 05/16/2012 02:01 07/18/2012 20:47 09/18/2012 18:52  Hemoglobin Latest Range: 13.0-17.0 g/dL 8.7 (L) 9.3 (L) 8.2 (L) 9.6 (L) 9.4 (L)  HCT Latest Range: 39.0-52.0 % 23.6 (L) 25.4 (L) 22.6 (L) 27.6 (L) 28.1 (L)   Dg Chest 1 View 09/18/2012   CLINICAL DATA:  Hypertension, shortness of Breath.  EXAM: CHEST - 1 VIEW  COMPARISON:  07/18/2012  IMPRESSION: Hyperinflation and chronic interstitial changes. No acute disease.   Electronically Signed   By: Oley Balm M.D.   On: 09/18/2012 20:15    2015:  SBP dropped to 94 from 121 during orthostatic VS and pt c/o "dizziness." Will dose judicious IVF. H/H per baseline. +UTI, UC pending; will dose IV rocephin. T/C to Dr. Janna Arch, case discussed, including:  HPI, pertinent PM/SHx, VS/PE, dx testing, ED course and treatment:  Agreeable to observation admit, requests to write temporary orders, obtain medical bed.      Laray Anger, DO 09/18/12 2359

## 2012-09-18 NOTE — ED Notes (Signed)
Patient brought in via EMS from home. Patient alert and oriented. Airway patent. Patient c/o shortness of breath x1 day. Per patient productive cough, thick grey sputum. Per EMS patient's initial O2 sat 97% on room air. Patient reports some dizziness-per EMS initial B/P 94/48. Blood sugar per EMS 79. Patient normal sinus rhythm on monitor per EMS.

## 2012-09-19 LAB — HEMOGLOBIN AND HEMATOCRIT, BLOOD
HCT: 24.9 % — ABNORMAL LOW (ref 39.0–52.0)
Hemoglobin: 9.2 g/dL — ABNORMAL LOW (ref 13.0–17.0)
Hemoglobin: 8.5 g/dL — ABNORMAL LOW (ref 13.0–17.0)

## 2012-09-19 LAB — HEPATIC FUNCTION PANEL
Alkaline Phosphatase: 73 U/L (ref 39–117)
Total Bilirubin: 0.2 mg/dL — ABNORMAL LOW (ref 0.3–1.2)
Total Protein: 6.2 g/dL (ref 6.0–8.3)

## 2012-09-19 LAB — IRON AND TIBC
Iron: 18 ug/dL — ABNORMAL LOW (ref 42–135)
UIBC: 296 ug/dL (ref 125–400)

## 2012-09-19 LAB — FERRITIN: Ferritin: 13 ng/mL — ABNORMAL LOW (ref 22–322)

## 2012-09-19 LAB — BASIC METABOLIC PANEL
CO2: 23 mEq/L (ref 19–32)
Calcium: 8.7 mg/dL (ref 8.4–10.5)
Creatinine, Ser: 0.78 mg/dL (ref 0.50–1.35)

## 2012-09-19 LAB — PROTIME-INR: Prothrombin Time: 13.2 seconds (ref 11.6–15.2)

## 2012-09-19 LAB — RPR: RPR Ser Ql: REACTIVE — AB

## 2012-09-19 LAB — TSH: TSH: 0.66 u[IU]/mL (ref 0.350–4.500)

## 2012-09-19 MED ORDER — ALBUTEROL SULFATE (5 MG/ML) 0.5% IN NEBU
2.5000 mg | INHALATION_SOLUTION | Freq: Four times a day (QID) | RESPIRATORY_TRACT | Status: DC
  Administered 2012-09-19 – 2012-09-22 (×8): 2.5 mg via RESPIRATORY_TRACT
  Filled 2012-09-19 (×8): qty 0.5

## 2012-09-19 MED ORDER — IPRATROPIUM BROMIDE 0.02 % IN SOLN
0.5000 mg | Freq: Four times a day (QID) | RESPIRATORY_TRACT | Status: DC
  Administered 2012-09-19 – 2012-09-22 (×8): 0.5 mg via RESPIRATORY_TRACT
  Filled 2012-09-19 (×8): qty 2.5

## 2012-09-19 MED ORDER — INFLUENZA VAC SPLIT QUAD 0.5 ML IM SUSP
0.5000 mL | Freq: Once | INTRAMUSCULAR | Status: AC
  Administered 2012-09-19: 0.5 mL via INTRAMUSCULAR
  Filled 2012-09-19: qty 0.5

## 2012-09-19 MED ORDER — IPRATROPIUM BROMIDE 0.02 % IN SOLN: 0.5000 mg | RESPIRATORY_TRACT | Status: DC

## 2012-09-19 MED ORDER — ALBUTEROL SULFATE (5 MG/ML) 0.5% IN NEBU: 2.5000 mg | INHALATION_SOLUTION | Freq: Four times a day (QID) | RESPIRATORY_TRACT | Status: DC

## 2012-09-19 MED ORDER — PNEUMOCOCCAL VAC POLYVALENT 25 MCG/0.5ML IJ INJ
0.5000 mL | INJECTION | Freq: Once | INTRAMUSCULAR | Status: AC
  Administered 2012-09-19: 0.5 mL via INTRAMUSCULAR
  Filled 2012-09-19: qty 0.5

## 2012-09-19 NOTE — Clinical Social Work Psychosocial (Signed)
Clinical Social Work Department BRIEF PSYCHOSOCIAL ASSESSMENT 09/19/2012  Patient:  Jermaine [name], Jermaine [name]     Account Number:  [id number]     Admit date:  09/18/2012  Clinical Social Worker:  Nancie Neas  Date/Time:  09/19/2012 04:00 PM  Referred by:  CSW  Date Referred:  09/19/2012 Referred for  ALF Placement   Other Referral:   Interview type:  Patient Other interview type:    PSYCHOSOCIAL DATA Living Status:  ALONE Admitted from facility:   Level of care:   Primary support name:   Primary support relationship to patient:   Degree of support available:   none per pt    CURRENT CONCERNS Current Concerns  Post-Acute Placement   Other Concerns:    SOCIAL WORK ASSESSMENT / PLAN CSW met with pt after PT evaluation. Pt alert and oriented, but quickly became frustrated with CSW. He is very well known to CSW from previous admissions. Pt was d/c in May to [facility] where he stayed for 2 months. Pt has been living at home alone since then. He did say that he had a "lady helper" at first, but said that that did not work out. Pt lives in a two bedroom apartment. He has been wheelchair bound for the past year, but is able to transfer independently. Pt describes no support from anyone. He would like to go to ALF/FCH for assistance. CSW discussed placement process. Pt was agreeable until finances were concerned. He was appalled when he found out that [employer] would not cover this level of care. Pt said he has Medicaid, but hospital records do not show this. Pt also makes over 1200 a month. CSW explained that if pt did have Medicaid then he would only have $66 a month of his check. If he did not, then he would have to pay privately. Pt's expectation was that a facility be found for him for $500 a month. CSW explained that this was not feasible. He was told that he could call facilities on list to find out about specific rates. Pt was unwilling to budge above this amount. He said he pays $300 a month for his  current rent. CSW shared that pt would have around the clock staff at facility and that this required a higher cost than rent at an apartment. He said he would have no money for clothes and other things like going out with his "buddies." He is concerned about being fed "slop" at a facility as well. When told that he had to make a decision to either pay for ALF/FCH or return home, pt states, "I guess I'll go back to the dog house." He is aware that CM can discuss a home health social worker to continue to assist him in his search for a facility if he desired.   Assessment/plan status:  Referral to [facility] Other assessment/ plan:   Information/referral to community resources:   ALF list  CM for home health    PATIENT'S/FAMILY'S RESPONSE TO PLAN OF CARE: Pt had very high expectations of a facility which CSW informed him could not be met for a few hundred dollars a month. He was very frustrated with this information and cursed multiple times during assessment. CSW spent extensive amount of time in pt's room. CSW told him that we were happy to assist him, but he would have to be cooperative with process and be realistic about costs. Pt was not interested in doing so. CSW notified CM of above and his plan to return  home. CSW will sign off but can be reconsulted if needed.       Derenda Fennel, [place] [phone]

## 2012-09-19 NOTE — Progress Notes (Signed)
Subjective: Patient was admitted last night due to UTI and hypotension. Patient has history of alcohol abuse.  Objective: Vital signs in last 24 hours: Temp:  [98 F (36.7 C)-98.9 F (37.2 C)] 98 F (36.7 C) (09/18 1501) Pulse Rate:  [64-88] 64 (09/18 1501) Resp:  [20] 20 (09/18 1501) BP: (94-137)/(53-76) 132/53 mmHg (09/18 1501) SpO2:  [96 %-100 %] 99 % (09/18 1501) Weight:  [54.432 kg (120 lb)] 54.432 kg (120 lb) (09/17 1818) Weight change:  Last BM Date: 09/17/12  Intake/Output from previous day: 09/17 0701 - 09/18 0700 In: 240 [P.O.:240] Out: 300 [Urine:300]  PHYSICAL EXAM General appearance: alert, cachectic and no distress Resp: clear to auscultation bilaterally Cardio: S1, S2 normal GI: soft, non-tender; bowel sounds normal; no masses,  no organomegaly Extremities: extremities normal, atraumatic, no cyanosis or edema  Lab Results:    @labtest @ [facility] No results found for this basename: PHART, PCO2, PO2ART, TCO2, HCO3,  in the last 72 hours CULTURES Recent Results (from the past 240 hour(s))  CULTURE, BLOOD (ROUTINE X 2)     Status: None   Collection Time    09/18/12  9:25 PM      Result Value Range Status   Specimen Description RIGHT ANTECUBITAL   Final   Special Requests BOTTLES DRAWN AEROBIC AND ANAEROBIC 10CC EACH   Final   Culture NO GROWTH 1 DAY   Final   Report Status PENDING   Incomplete  CULTURE, BLOOD (ROUTINE X 2)     Status: None   Collection Time    09/18/12  9:38 PM      Result Value Range Status   Specimen Description BLOOD RIGHT HAND   Final   Special Requests BOTTLES DRAWN AEROBIC AND ANAEROBIC 6CC EACH   Final   Culture NO GROWTH 1 DAY   Final   Report Status PENDING   Incomplete   Studies/Results: Dg Chest 1 View  09/18/2012   CLINICAL DATA:  Hypertension, shortness of Breath.  EXAM: CHEST - 1 VIEW  COMPARISON:  07/18/2012  IMPRESSION: Hyperinflation and chronic interstitial changes. No acute disease.   Electronically Signed   By: Oley Balm M.D.   On: 09/18/2012 20:15    Medications: I have reviewed the patient's current medications.  Assesment: 1. UTI  2. Orthostatic Hypotension  3. Alcohol Abuse  4. H/O COPD  5. H/O DVT  6. Severe Protein Calory malnutrition     Active Problems:   * No active hospital problems. *    Plan: Continue IV hydration Continue IV antibiotics Physical therapy Supportive care.    LOS: 1 day   Meloney Feld 09/19/2012, 4:04 PM

## 2012-09-19 NOTE — Care Management Note (Signed)
    Page 1 of 2   09/26/2012     11:40:45 AM   CARE MANAGEMENT NOTE 09/26/2012  Patient:  Jermaine [name], Jermaine [name]   Account Number:  [id number]  Date Initiated:  09/19/2012  Documentation initiated by:  Rosemary Holms  Subjective/Objective Assessment:   Pt from home. CSW met with pt regarding ALF. Pt not willing to be placed due to financial concerns. CM spoke to pt about HH. Pt adamantly states he can not return home, not his home. CM notified pt that CM will help with [facility] is he wishes.     Action/Plan:   Anticipated DC Date:  09/25/2012   Anticipated DC Plan:  HOME W HOME HEALTH SERVICES  In-house referral  Clinical Social Worker      DC Planning Services  CM consult      Choice offered to / List presented to:          [facility] arranged  HH-1 RN  HH-10 DISEASE MANAGEMENT  HH-2 PT  HH-4 NURSE'S AIDE  HH-6 SOCIAL WORKER      HH agency  [facility]   Status of service:  Completed, signed off Medicare Important Message given?   (If response is "NO", the following Medicare IM given date fields will be blank) Date Medicare IM given:   Date Additional Medicare IM given:    Discharge Disposition:  HOME W HOME HEALTH SERVICES  Per UR Regulation:    If discussed at Long Length of Stay Meetings, dates discussed:   09/24/2012  09/26/2012    Comments:  09/26/12 Rosemary Holms RN BSN CM Pt going home today with [facility]. [facility] notified. Will begin services within 24-48 hrs  09/24/12 Rosemary Holms RN BSN CM CM was hopeful for DC today but per MD pt needs GI workup. EGD planned for tomorrow  09/23/12 Rosemary Holms RN BSN CM Pt more agreeable today. Agreed to [facility] and to going home. Spoke to RN Norva Karvonen regarding DC. Shanda Bumps called Dr. [name] regarding pt not allowing RN to draw blood and DC plans. Per RN, Dr. [name] indicated "maybe tomorrow"  09/19/12 Rosemary Holms RN BSN CM

## 2012-09-19 NOTE — Evaluation (Signed)
Physical Therapy Evaluation Patient Details Name: Jermaine [name] MRN: [id number] DOB: 11-Nov-1931 Today's Date: 09/19/2012 Time: 9147-8295 PT Time Calculation (min): 31 min  PT Assessment / Plan / Recommendation History of Present Illness   Pt is admitted with alcohol intoxication, dyspnea UTI.  He is w/c dependent and trying to live alone but states that he needs to have someone else around.  Clinical Impression   Pt is seen for evaluation.  He has been seen by this therapist in the past and is not changed since then (even though he spent 2 months at [facility] for rehab).  He has bilateral hip and knee flexion contractures and is able to transfer independently from bed to chair.  This is his maximal potential.  He would benefit from living in ACLF as it appears that he is not able to care for himself.    PT Assessment  Patent does not need any further PT services    Follow Up Recommendations  No PT follow up...supervision/assistance 24 hour/day    Does the patient have the potential to tolerate intense rehabilitation      Barriers to Discharge        Equipment Recommendations  None recommended by PT    Recommendations for Other Services     Frequency      Precautions / Restrictions Precautions Precautions: Fall Restrictions Weight Bearing Restrictions: No   Pertinent Vitals/Pain       Mobility  Bed Mobility Bed Mobility: Supine to Sit Supine to Sit: 7: Independent;HOB elevated Transfers Transfers: Squat Pivot Transfers Squat Pivot Transfers: 7: Independent Details for Transfer Assistance: pt had no difficulty transferring to a recliner chair Ambulation/Gait Ambulation/Gait Assistance: Not tested (comment)    Exercises     PT Diagnosis:    PT Problem List:   PT Treatment Interventions:       PT Goals(Current goals can be found in the care plan section) Acute Rehab PT Goals PT Goal Formulation: No goals set, d/c therapy  Visit Information  Last PT Received On:  09/19/12       Prior Functioning  Home Living Family/patient expects to be discharged to:: Assisted living Living Arrangements: Alone Home Equipment: Wheelchair - manual Additional Comments: Pt is w/c dependent at home but states that he is having difficulty managing on his own...he enjoyed being at [facility] so that he could socialize with all of the other residents Prior Function Level of Independence: Independent with assistive device(s) Communication Communication: Expressive difficulties (speech is difficult to understand)    Cognition  Cognition Arousal/Alertness: Awake/alert Behavior During Therapy: WFL for tasks assessed/performed Overall Cognitive Status: Within Functional Limits for tasks assessed    Extremity/Trunk Assessment Lower Extremity Assessment Lower Extremity Assessment: RLE deficits/detail;LLE deficits/detail RLE Deficits / Details: hip and knee flexion contractures...45 degrees at hip, 40 degrees at knee,,,,,,able to lift leg off of bed slightly LLE Deficits / Details: same as above for RLE   Balance    End of Session PT - End of Session Equipment Utilized During Treatment: Gait belt Activity Tolerance: Patient tolerated treatment well Patient left: in chair;with call bell/phone within reach;with chair alarm set Nurse Communication: Mobility status  GP     Konrad Penta 09/19/2012, 3:36 PM

## 2012-09-19 NOTE — H&P (Signed)
Jermaine [name] MRN: [id number] DOB/AGE: September 13, 1931 79 y.o. Primary Care Physician:Harvir Patry, MD Admit date: 09/18/2012 Chief Complaint:  Shortness of breath and generalized weakness HPI:  This is a 79 years old male patient with history of multiple medical illnesses including alcohol abuse was brought with the above complaint. He was found hypotensive and very weak. He was living alone by himself and he was abusing alcohol. His urinalysis showed sign of UTI. Patient was started on IV antibiotics and Iv fluid and admitted for further treatment. No history of fever, chills, cough, chest pain, nausea or vomiting.  Past Medical History  Diagnosis Date  . COPD (chronic obstructive pulmonary disease)   . Chronic hip pain     Avascular necrosis, bilateral hips.  . DVT (deep venous thrombosis)   . PE (pulmonary embolism) May/2012.  Marland Kitchen Alcohol abuse   . Elevated PSA     Lost to followup.  . Macrocytic anemia   . Lytic lesion of bone on x-ray May/2012.    Negative bone scan.  . Bilateral pneumonia May/2012.  Marland Kitchen Thoracic compression fracture   . Tobacco abuse   . Alcohol withdrawal syndrome 06/10/2011  . Delirium, drug-induced 06/2011    From Ativan  . Abnormal thyroid function test 06/2011.    Normal TSH and slightly low free T4.  Marland Kitchen Hypoglycemia   . Dysphagia   . Syncope and collapse   . Hematuria   . Spinal stenosis   . Cancer     malignant neoplasm of prostate  . Venous thrombosis   . 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 Chronic back pain    Past Surgical History  Procedure Laterality Date  . Total hip arthroplasty      Left hip.  . Tonsillectomy      as child  . Cystoscopy  10/17/2011    Procedure: CYSTOSCOPY;  Surgeon: Anner Crete, MD;  Location: [facility];  Service: Urology;  Laterality: N/A;  Cystoscopy/Orchiectomy/Transrectal Ultrasound Guided Prostate Biopsy  . Orchiectomy  10/17/2011    Procedure: ORCHIECTOMY;  Surgeon: Anner Crete, MD;  Location: [facility];  Service:  Urology;  Laterality: Bilateral;  . Prostate biopsy  10/17/2011    Procedure: BIOPSY TRANSRECTAL ULTRASONIC PROSTATE (TUBP);  Surgeon: Anner Crete, MD;  Location: [facility];  Service: Urology;  Laterality: N/A;        History reviewed. No pertinent family history.  Social History:  reports that he has been smoking Cigarettes.  He has a 30 pack-year smoking history. He has never used smokeless tobacco. He reports that he drinks about 24.0 ounces of alcohol per week. He reports that he does not use illicit drugs.   Allergies: No Known Allergies  Medications Prior to Admission  Medication Sig Dispense Refill  . Acetaminophen 500 MG coapsule Take 500 mg by mouth as needed for fever or pain.      Marland Kitchen albuterol (PROVENTIL) (2.5 MG/3ML) 0.083% nebulizer solution Take 2.5 mg by nebulization every 6 (six) hours as needed. For shortness of breath           MVH:QIONG from the symptoms mentioned above,there are no other symptoms referable to all systems reviewed.  Physical Exam: Blood pressure 132/53, pulse 64, temperature 98 F (36.7 C), temperature source Oral, resp. rate 20, height 5\' 11"  (1.803 m), weight 54.432 kg (120 lb), SpO2 99.00%.  General condition- Chronically sick looking, severely malnourished HE ENT- pupils equal and reactive, neck supple Chest-decreased air entry, rhonchi CVS-S1 and  S2 heard no murmur or gallop  Abdomen- Soft and lax, bowel sound ++, no area of tenderness EXT- no leg edema    Recent Labs  09/18/12 1852 09/18/12 2127 09/19/12 0621 09/19/12 1309  WBC 5.7 4.9  --   --   NEUTROABS 1.7  --   --   --   HGB 9.4* 9.7* 9.0* 9.2*  HCT 28.1* 28.8* 26.2* 27.5*  MCV 87.3 87.0  --   --   PLT 246 259  --   --     Recent Labs  09/18/12 1852 09/18/12 2127 09/19/12 0621  NA 142  --  142  K 3.7  --  3.7  CL 107  --  109  CO2 23  --  23  GLUCOSE 84  --  79  BUN 24*  --  23  CREATININE 0.87  --  0.78  CALCIUM 9.0  --  8.7  MG  --  1.8  --    lablast2(ast:2,ALT:2,alkphos:2,bilitot:2,prot:2,albumin:2)@    Recent Results (from the past 240 hour(s))  CULTURE, BLOOD (ROUTINE X 2)     Status: None   Collection Time    09/18/12  9:25 PM      Result Value Range Status   Specimen Description RIGHT ANTECUBITAL   Final   Special Requests BOTTLES DRAWN AEROBIC AND ANAEROBIC 10CC EACH   Final   Culture NO GROWTH 1 DAY   Final   Report Status PENDING   Incomplete  CULTURE, BLOOD (ROUTINE X 2)     Status: None   Collection Time    09/18/12  9:38 PM      Result Value Range Status   Specimen Description BLOOD RIGHT HAND   Final   Special Requests BOTTLES DRAWN AEROBIC AND ANAEROBIC 6CC EACH   Final   Culture NO GROWTH 1 DAY   Final   Report Status PENDING   Incomplete     Dg Chest 1 View  09/18/2012   CLINICAL DATA:  Hypertension, shortness of Breath.  EXAM: CHEST - 1 VIEW  COMPARISON:  07/18/2012  IMPRESSION: Hyperinflation and chronic interstitial changes. No acute disease.   Electronically Signed   By: Oley Balm M.D.   On: 09/18/2012 20:15   Impression: 1. UTI 2. Orthostatic Hypotension 3. Alcohol Abuse 4. H/O COPD 5. H/O DVT 6. Severe Protein Calory malnutrition   Active Problems:   * No active hospital problems. *     Plan: Continue IV hydration Continue IV antibiotic Will monitor cbc/bmp Nutritional supportt      Shalice Woodring Pager [phone]  09/19/2012, 3:50 PM

## 2012-09-20 LAB — BASIC METABOLIC PANEL
Calcium: 8.6 mg/dL (ref 8.4–10.5)
Creatinine, Ser: 0.59 mg/dL (ref 0.50–1.35)
GFR calc Af Amer: >90 mL/min (ref >90)
GFR calc non Af Amer: >90 mL/min (ref >90)

## 2012-09-20 LAB — HEPATIC FUNCTION PANEL
Bilirubin, Direct: <0.1 mg/dL (ref 0.0–0.3)
Total Bilirubin: 0.3 mg/dL (ref 0.3–1.2)

## 2012-09-20 LAB — HEMOGLOBIN AND HEMATOCRIT, BLOOD
HCT: 23.6 % — ABNORMAL LOW (ref 39.0–52.0)
Hemoglobin: 8.9 g/dL — ABNORMAL LOW (ref 13.0–17.0)
Hemoglobin: 8.8 g/dL — ABNORMAL LOW (ref 13.0–17.0)

## 2012-09-20 LAB — T.PALLIDUM AB, IGG: T pallidum Antibodies (TP-PA): >8 S/CO — ABNORMAL HIGH (ref <0.90)

## 2012-09-20 LAB — PROTIME-INR
INR: 0.98 (ref 0.00–1.49)
Prothrombin Time: 12.8 seconds (ref 11.6–15.2)

## 2012-09-20 MED ORDER — POTASSIUM CHLORIDE CRYS ER 20 MEQ PO TBCR
40.0000 meq | EXTENDED_RELEASE_TABLET | Freq: Once | ORAL | Status: AC
  Administered 2012-09-20: 40 meq via ORAL
  Filled 2012-09-20: qty 2

## 2012-09-20 NOTE — Progress Notes (Signed)
PT Cancellation Note  Patient Details Name: Jermaine [name] MRN: [id number] DOB: October 24, 1931   Cancelled Treatment:    Reason Eval/Treat Not Completed: Other (comment) We have received another request for a PT eval.  He was seen yesterday and found to be at functional baseline.  He has no rehab potential.  He has no PT needs. We will remove him from our schedule.  Myrlene Broker L 09/20/2012, 7:58 AM

## 2012-09-20 NOTE — Progress Notes (Signed)
Subjective: Patient feels better. He is resting. He wants to go to nursing home. He has no support at home.  Objective: Vital signs in last 24 hours: Temp:  [98 F (36.7 C)-98.2 F (36.8 C)] 98.1 F (36.7 C) (09/19 0606) Pulse Rate:  [60-65] 60 (09/19 0606) Resp:  [20] 20 (09/19 0606) BP: (132-138)/(50-71) 133/50 mmHg (09/19 0606) SpO2:  [98 %-100 %] 98 % (09/19 0606) Weight change:  Last BM Date: 09/17/12  Intake/Output from previous day: 09/18 0701 - 09/19 0700 In: 600 [P.O.:600] Out: 400 [Urine:400]  PHYSICAL EXAM General appearance: alert, cachectic and no distress Resp: clear to auscultation bilaterally Cardio: S1, S2 normal GI: soft, non-tender; bowel sounds normal; no masses,  no organomegaly Extremities: extremities normal, atraumatic, no cyanosis or edema  Lab Results:    @labtest @ [facility] No results found for this basename: PHART, PCO2, PO2ART, TCO2, HCO3,  in the last 72 hours CULTURES Recent Results (from the past 240 hour(s))  CULTURE, BLOOD (ROUTINE X 2)     Status: None   Collection Time    09/18/12  9:25 PM      Result Value Range Status   Specimen Description RIGHT ANTECUBITAL   Final   Special Requests BOTTLES DRAWN AEROBIC AND ANAEROBIC 10CC EACH   Final   Culture NO GROWTH 1 DAY   Final   Report Status PENDING   Incomplete  CULTURE, BLOOD (ROUTINE X 2)     Status: None   Collection Time    09/18/12  9:38 PM      Result Value Range Status   Specimen Description BLOOD RIGHT HAND   Final   Special Requests BOTTLES DRAWN AEROBIC AND ANAEROBIC 6CC EACH   Final   Culture NO GROWTH 1 DAY   Final   Report Status PENDING   Incomplete   Studies/Results: Dg Chest 1 View  09/18/2012   CLINICAL DATA:  Hypertension, shortness of Breath.  EXAM: CHEST - 1 VIEW  COMPARISON:  07/18/2012  IMPRESSION: Hyperinflation and chronic interstitial changes. No acute disease.   Electronically Signed   By: Oley Balm M.D.   On: 09/18/2012 20:15    Medications: I  have reviewed the patient's current medications.  Assesment: 1. UTI  2. Orthostatic Hypotension  3. Alcohol Abuse  4. H/O COPD  5. H/O DVT  6. Severe Protein Calory malnutrition     Active Problems:   * No active hospital problems. *    Plan: Continue IV hydration Continue IV antibiotics Physical therapy Supportive care.    LOS: 2 days   Kameko Hukill 09/20/2012, 7:49 AM

## 2012-09-20 NOTE — Progress Notes (Signed)
UR Chart Review Completed  

## 2012-09-21 LAB — HEMOGLOBIN AND HEMATOCRIT, BLOOD: Hemoglobin: 8.3 g/dL — ABNORMAL LOW (ref 13.0–17.0)

## 2012-09-21 LAB — HEPATIC FUNCTION PANEL
AST: 11 U/L (ref 0–37)
Albumin: 2.7 g/dL — ABNORMAL LOW (ref 3.5–5.2)
Bilirubin, Direct: <0.1 mg/dL (ref 0.0–0.3)

## 2012-09-21 LAB — PROTIME-INR: INR: 1.03 (ref 0.00–1.49)

## 2012-09-21 MED ORDER — PREDNISONE 20 MG PO TABS
40.0000 mg | ORAL_TABLET | Freq: Every day | ORAL | Status: DC
  Administered 2012-09-22 – 2012-09-26 (×5): 40 mg via ORAL
  Filled 2012-09-21 (×7): qty 2

## 2012-09-21 MED ORDER — GUAIFENESIN ER 600 MG PO TB12
1200.0000 mg | ORAL_TABLET | Freq: Two times a day (BID) | ORAL | Status: DC
  Administered 2012-09-21 – 2012-09-26 (×10): 1200 mg via ORAL
  Filled 2012-09-21 (×11): qty 2

## 2012-09-21 NOTE — Progress Notes (Addendum)
Pt is refusing to allow lab to stick him. Lab will try again later.  MD is aware, will ask pt if he would be agreeable to checking it once a day. Will continue to monitor.

## 2012-09-21 NOTE — Progress Notes (Signed)
Subjective: This is a patient I am seeing for Dr. [name]. He was admitted with shortness of breath and weakness. He has a long known history of alcohol abuse. He has a UTI.  Objective: Vital signs in last 24 hours: Temp:  [97.5 F (36.4 C)-98.1 F (36.7 C)] 97.5 F (36.4 C) (09/20 0557) Pulse Rate:  [60-82] 71 (09/20 0557) Resp:  [18-20] 18 (09/20 0557) BP: (103-132)/(58-72) 129/58 mmHg (09/20 0557) SpO2:  [96 %-100 %] 100 % (09/20 0714) Weight change:  Last BM Date: 09/20/12  Intake/Output from previous day: 09/19 0701 - 09/20 0700 In: 840 [P.O.:600] Out: 450 [Urine:450]  PHYSICAL EXAM General appearance: alert and mild distress Resp: rhonchi bilaterally Cardio: regular rate and rhythm, S1, S2 normal, no murmur, click, rub or gallop GI: soft, non-tender; bowel sounds normal; no masses,  no organomegaly Extremities: extremities normal, atraumatic, no cyanosis or edema  Lab Results:    Basic Metabolic Panel:  Recent Labs  16/10/96 2127 09/19/12 0621 09/20/12 0533  NA  --  142 136  K  --  3.7 3.4*  CL  --  109 107  CO2  --  23 21  GLUCOSE  --  79 86  BUN  --  23 15  CREATININE  --  0.78 0.59  CALCIUM  --  8.7 8.6  MG 1.8  --   --    Liver Function Tests:  Recent Labs  09/20/12 0533 09/21/12 0503  AST 12 11  ALT <5 <5  ALKPHOS 65 64  BILITOT 0.3 0.2*  PROT 5.8* 5.9*  ALBUMIN 2.6* 2.7*   No results found for this basename: LIPASE, AMYLASE,  in the last 72 hours  Recent Labs  09/19/12 0622  AMMONIA 12   CBC:  Recent Labs  09/18/12 1852 09/18/12 2127  09/20/12 2113 09/21/12 0503  WBC 5.7 4.9  --   --   --   NEUTROABS 1.7  --   --   --   --   HGB 9.4* 9.7*  < > 8.0* 8.3*  HCT 28.1* 28.8*  < > 23.6* 24.4*  MCV 87.3 87.0  --   --   --   PLT 246 259  --   --   --   < > = values in this interval not displayed. Cardiac Enzymes:  Recent Labs  09/18/12 1852  TROPONINI <0.30   BNP:  Recent Labs  09/18/12 1852  PROBNP 145.9    D-Dimer: No results found for this basename: DDIMER,  in the last 72 hours CBG:  Recent Labs  09/18/12 1821  GLUCAP 89   Hemoglobin A1C: No results found for this basename: HGBA1C,  in the last 72 hours Fasting Lipid Panel: No results found for this basename: CHOL, HDL, LDLCALC, TRIG, CHOLHDL, LDLDIRECT,  in the last 72 hours Thyroid Function Tests:  Recent Labs  09/18/12 2127  TSH 0.660   Anemia Panel:  Recent Labs  09/18/12 2127  VITAMINB12 193*  FOLATE 10.4  FERRITIN 13*  TIBC 314  IRON 18*  RETICCTPCT 1.1   Coagulation:  Recent Labs  09/20/12 0533 09/21/12 0503  LABPROT 12.8 13.3  INR 0.98 1.03   Urine Drug Screen: Drugs of Abuse     Component Value Date/Time   LABOPIA NONE DETECTED 05/02/2012 1205   COCAINSCRNUR NONE DETECTED 05/02/2012 1205   LABBENZ NONE DETECTED 05/02/2012 1205   AMPHETMU NONE DETECTED 05/02/2012 1205   THCU NONE DETECTED 05/02/2012 1205   LABBARB NONE DETECTED 05/02/2012 1205  Alcohol Level:  Recent Labs  09/18/12 1852  ETH 207*   Urinalysis:  Recent Labs  09/18/12 1947  COLORURINE YELLOW  LABSPEC 1.015  PHURINE 7.0  GLUCOSEU NEGATIVE  HGBUR TRACE*  BILIRUBINUR NEGATIVE  KETONESUR NEGATIVE  PROTEINUR TRACE*  UROBILINOGEN 0.2  NITRITE POSITIVE*  LEUKOCYTESUR SMALL*   Misc. Labs:  [facility] No results found for this basename: PHART, PCO2, PO2ART, TCO2, HCO3,  in the last 72 hours CULTURES Recent Results (from the past 240 hour(s))  URINE CULTURE     Status: None   Collection Time    09/18/12  7:47 PM      Result Value Range Status   Specimen Description URINE, CLEAN CATCH   Final   Special Requests NONE   Final   Culture  Setup Time     Final   Value: 09/19/2012 14:07     Performed at [facility] Count     Final   Value: >=100,000 COLONIES/ML     Performed at [facility]   Culture     Final   Value: ESCHERICHIA COLI     PROTEUS MIRABILIS     Performed at [facility]    Report Status PENDING   Incomplete  CULTURE, BLOOD (ROUTINE X 2)     Status: None   Collection Time    09/18/12  9:25 PM      Result Value Range Status   Specimen Description BLOOD RIGHT ANTECUBITAL   Final   Special Requests BOTTLES DRAWN AEROBIC AND ANAEROBIC 10CC EACH   Final   Culture NO GROWTH 3 DAYS   Final   Report Status PENDING   Incomplete  CULTURE, BLOOD (ROUTINE X 2)     Status: None   Collection Time    09/18/12  9:38 PM      Result Value Range Status   Specimen Description BLOOD RIGHT HAND   Final   Special Requests BOTTLES DRAWN AEROBIC AND ANAEROBIC 6CC EACH   Final   Culture NO GROWTH 3 DAYS   Final   Report Status PENDING   Incomplete   Studies/Results: No results found.  Medications:  Scheduled: . ipratropium  0.5 mg Nebulization Q6H   And  . albuterol  2.5 mg Nebulization Q6H  . cefTRIAXone (ROCEPHIN)  IV  1 g Intravenous Q24H  . enoxaparin (LOVENOX) injection  40 mg Subcutaneous QHS  . pantoprazole  40 mg Oral Q1200  . sodium chloride  250 mL Intravenous Once   Continuous: . sodium chloride 50 mL/hr at 09/20/12 1635   JWJ:[id number], ondansetron  Assesment: He is admitted with generalized weakness and urinary tract infection COPD exacerbation and chronic alcoholism Active Problems:   * No active hospital problems. *    Plan:add steroids and mucinex    LOS: 3 days   Charlea Nardo L 09/21/2012, 7:58 AM

## 2012-09-22 LAB — PROTIME-INR: INR: 1.02 (ref 0.00–1.49)

## 2012-09-22 LAB — URINE CULTURE

## 2012-09-22 LAB — HEMOGLOBIN AND HEMATOCRIT, BLOOD: HCT: 24.6 % — ABNORMAL LOW (ref 39.0–52.0)

## 2012-09-22 MED ORDER — ALBUTEROL SULFATE (5 MG/ML) 0.5% IN NEBU
2.5000 mg | INHALATION_SOLUTION | Freq: Two times a day (BID) | RESPIRATORY_TRACT | Status: DC
  Administered 2012-09-22 – 2012-09-26 (×7): 2.5 mg via RESPIRATORY_TRACT
  Filled 2012-09-22 (×8): qty 0.5

## 2012-09-22 MED ORDER — IPRATROPIUM BROMIDE 0.02 % IN SOLN
0.5000 mg | Freq: Two times a day (BID) | RESPIRATORY_TRACT | Status: DC
  Administered 2012-09-22 – 2012-09-26 (×7): 0.5 mg via RESPIRATORY_TRACT
  Filled 2012-09-22 (×8): qty 2.5

## 2012-09-22 MED ORDER — ALBUTEROL SULFATE (5 MG/ML) 0.5% IN NEBU: 2.5000 mg | INHALATION_SOLUTION | Freq: Four times a day (QID) | RESPIRATORY_TRACT | Status: DC | PRN

## 2012-09-22 MED ORDER — ACETAMINOPHEN 500 MG PO TABS
500.0000 mg | ORAL_TABLET | ORAL | Status: DC | PRN
  Administered 2012-09-22 (×2): 500 mg via ORAL
  Filled 2012-09-22 (×2): qty 1

## 2012-09-22 NOTE — Plan of Care (Signed)
Pt complaining of chest pain.  Vitals checked X2 and stable. Pt having no diff breathing. Stat 12 lead ordered and Dr. Sudie Bailey called to inform. Also ordered Tylenol for pain.  No further orders at this time.

## 2012-09-23 LAB — CULTURE, BLOOD (ROUTINE X 2)
Culture: NO GROWTH
Culture: NO GROWTH

## 2012-09-23 MED ORDER — PENICILLIN G BENZATHINE 1200000 UNIT/2ML IM SUSP
2.4000 10*6.[IU] | INTRAMUSCULAR | Status: DC
  Administered 2012-09-23: 2.4 10*6.[IU] via INTRAMUSCULAR
  Filled 2012-09-23: qty 4

## 2012-09-23 NOTE — Progress Notes (Signed)
Subjective: Patient remained weak and deconditioned. His breathing has improved. He dropped his H/H. No sign of external bleeding.  Objective: Vital signs in last 24 hours: Temp:  [97.2 F (36.2 C)-98.1 F (36.7 C)] 97.2 F (36.2 C) (09/22 0427) Pulse Rate:  [74-104] 74 (09/22 0427) Resp:  [16-20] 20 (09/22 0427) BP: (101-149)/(60-73) 149/73 mmHg (09/22 0427) SpO2:  [98 %-100 %] 100 % (09/22 0427) Weight change:  Last BM Date: 09/21/12  Intake/Output from previous day: 09/21 0701 - 09/22 0700 In: 607.5 [I.V.:557.5; IV Piggyback:50] Out: 1400 [Urine:1400]  PHYSICAL EXAM General appearance: alert, cachectic and no distress Resp: clear to auscultation bilaterally Cardio: S1, S2 normal GI: soft, non-tender; bowel sounds normal; no masses,  no organomegaly Extremities: extremities normal, atraumatic, no cyanosis or edema  Lab Results:    @labtest @ [facility] No results found for this basename: PHART, PCO2, PO2ART, TCO2, HCO3,  in the last 72 hours CULTURES Recent Results (from the past 240 hour(s))  URINE CULTURE     Status: None   Collection Time    09/18/12  7:47 PM      Result Value Range Status   Specimen Description URINE, CLEAN CATCH   Final   Special Requests NONE   Final   Culture  Setup Time     Final   Value: 09/19/2012 14:07     Performed at [facility] Count     Final   Value: >=100,000 COLONIES/ML     Performed at [facility]   Culture     Final   Value: ESCHERICHIA COLI     PROTEUS MIRABILIS     Performed at [facility]   Report Status 09/22/2012 FINAL   Final   Organism ID, Bacteria ESCHERICHIA COLI   Final   Organism ID, Bacteria PROTEUS MIRABILIS   Final  CULTURE, BLOOD (ROUTINE X 2)     Status: None   Collection Time    09/18/12  9:25 PM      Result Value Range Status   Specimen Description BLOOD RIGHT ANTECUBITAL   Final   Special Requests BOTTLES DRAWN AEROBIC AND ANAEROBIC 10CC EACH   Final   Culture NO GROWTH  4 DAYS   Final   Report Status PENDING   Incomplete  CULTURE, BLOOD (ROUTINE X 2)     Status: None   Collection Time    09/18/12  9:38 PM      Result Value Range Status   Specimen Description BLOOD RIGHT HAND   Final   Special Requests BOTTLES DRAWN AEROBIC AND ANAEROBIC 6CC EACH   Final   Culture NO GROWTH 4 DAYS   Final   Report Status PENDING   Incomplete   Studies/Results: No results found.  Medications: I have reviewed the patient's current medications.  Assesment: 1. UTI  2. Orthostatic Hypotension  3. Alcohol Abuse  4. H/O COPD  5. H/O DVT  6. Severe Protein Calory malnutrition  7. Anemia etiology unclear   Active Problems:   * No active hospital problems. *    Plan: Continue IV hydration Continue IV antibiotics Anemia profile Occult stool test Supportive care.    LOS: 5 days   Sanav Remer 09/23/2012, 8:09 AM

## 2012-09-23 NOTE — Progress Notes (Signed)
Patient refused lab sticks this am.

## 2012-09-24 ENCOUNTER — Encounter (HOSPITAL_COMMUNITY): Payer: Self-pay | Admitting: Gastroenterology

## 2012-09-24 DIAGNOSIS — D649 Anemia, unspecified: Secondary | ICD-10-CM

## 2012-09-24 LAB — CBC
HCT: 23.2 % — ABNORMAL LOW (ref 39.0–52.0)
Hemoglobin: 7.8 g/dL — ABNORMAL LOW (ref 13.0–17.0)
MCH: 29.5 pg (ref 26.0–34.0)
MCHC: 33.6 g/dL (ref 30.0–36.0)
MCV: 87.9 fL (ref 78.0–100.0)
RBC: 2.64 MIL/uL — ABNORMAL LOW (ref 4.22–5.81)

## 2012-09-24 LAB — PREPARE RBC (CROSSMATCH)

## 2012-09-24 LAB — OCCULT BLOOD X 1 CARD TO LAB, STOOL: Fecal Occult Bld: POSITIVE — AB

## 2012-09-24 MED ORDER — PANTOPRAZOLE SODIUM 40 MG PO TBEC
40.0000 mg | DELAYED_RELEASE_TABLET | Freq: Every day | ORAL | Status: DC
  Administered 2012-09-24 – 2012-09-26 (×3): 40 mg via ORAL
  Filled 2012-09-24 (×3): qty 1

## 2012-09-24 MED ORDER — SODIUM CHLORIDE 0.9 % IV SOLN
INTRAVENOUS | Status: DC
  Administered 2012-09-25: 09:00:00 via INTRAVENOUS

## 2012-09-24 MED ORDER — PROMETHAZINE HCL 25 MG/ML IJ SOLN
12.5000 mg | INTRAMUSCULAR | Status: AC
  Administered 2012-09-25: 12.5 mg via INTRAVENOUS

## 2012-09-24 MED ORDER — FERROUS SULFATE 300 (60 FE) MG/5ML PO SYRP
300.0000 mg | ORAL_SOLUTION | Freq: Two times a day (BID) | ORAL | Status: DC
  Administered 2012-09-24 – 2012-09-26 (×3): 300 mg via ORAL
  Filled 2012-09-24 (×6): qty 5

## 2012-09-24 MED ORDER — PANTOPRAZOLE SODIUM 40 MG PO TBEC: 40.0000 mg | DELAYED_RELEASE_TABLET | Freq: Every day | ORAL | Status: DC

## 2012-09-24 NOTE — Consult Note (Addendum)
Referring Provider: Dr. [name] Primary Care Physician:  Dr. [name] Primary Gastroenterologist:  Dr. Jena Gauss   Date of Admission: 09/18/12 Date of Consultation: 09/24/12  Reason for Consultation:  Acute on chronic anemia  HPI:  Mr. Jermaine [name] is a 79 year old male with a past history significant for alcohol abuse and multiple comorbidities, presenting this admission on 9/17 with shortness of breath, cough, and dizziness. Admitting alcohol level was 207, Hgb 9.4. Iron studies show low iron and ferritin. As of this morning, Hgb has drifted to 7.8, with a microcytic anemia. Hemoccult status unknown. Appears he has chronic anemia, with baseline in the 8-9/10 range. 2 units of PRBCs have been ordered but not started yet.   Drinks pint of gin or vodka a day. Alcohol use since 1950. He is unsure about prior colonoscopy but believes he had one at [facility] in the remote past. Poor historian. No prior EGD. No abdominal pain. No rectal bleeding. Notes black stool "goes and comes".  Eating peanut butter and jelly sandwiches makes his "bowel movements hard as hell". BM usually every other day.   No loss of appetite. No weight loss. No reflux, no dysphagia. When he feels "down", he takes an advil. Takes "whenever I need it", unable to quantify how much. Denies aspirin powders. He reports a chronic history of anemia. States when he was in the service, his blood counts were "right on the breaking line".   Past Medical History  Diagnosis Date  . COPD (chronic obstructive pulmonary disease)   . Chronic hip pain     Avascular necrosis, bilateral hips.  . DVT (deep venous thrombosis)   . PE (pulmonary embolism) May/2012.  Marland Kitchen Alcohol abuse   . Elevated PSA     Lost to followup.  . Macrocytic anemia   . Lytic lesion of bone on x-ray May/2012.    Negative bone scan.  . Bilateral pneumonia May/2012.  Marland Kitchen Thoracic compression fracture   . Tobacco abuse   . Alcohol withdrawal syndrome 06/10/2011  . Delirium,  drug-induced 06/2011    From Ativan  . Abnormal thyroid function test 06/2011.    Normal TSH and slightly low free T4.  Marland Kitchen Hypoglycemia   . Dysphagia   . Syncope and collapse   . Hematuria   . Spinal stenosis   . Cancer     malignant neoplasm of prostate  . Venous thrombosis   . 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 Chronic back pain     Past Surgical History  Procedure Laterality Date  . Total hip arthroplasty      Left hip.  . Tonsillectomy      as child  . Cystoscopy  10/17/2011    Procedure: CYSTOSCOPY;  Surgeon: Anner Crete, MD;  Location: [facility];  Service: Urology;  Laterality: N/A;  Cystoscopy/Orchiectomy/Transrectal Ultrasound Guided Prostate Biopsy  . Orchiectomy  10/17/2011    Procedure: ORCHIECTOMY;  Surgeon: Anner Crete, MD;  Location: [facility];  Service: Urology;  Laterality: Bilateral;  . Prostate biopsy  10/17/2011    Procedure: BIOPSY TRANSRECTAL ULTRASONIC PROSTATE (TUBP);  Surgeon: Anner Crete, MD;  Location: [facility];  Service: Urology;  Laterality: N/A;    Prior to Admission medications   Medication Sig Start Date End Date Taking? Authorizing Provider  Acetaminophen 500 MG coapsule Take 500 mg by mouth as needed for fever or pain.   Yes Historical Provider, MD  albuterol (PROVENTIL) (2.5 MG/3ML) 0.083% nebulizer solution Take  2.5 mg by nebulization every 6 (six) hours as needed. For shortness of breath    Historical Provider, MD    Current Facility-Administered Medications  Medication Dose Route Frequency Provider Last Rate Last Dose  . 0.9 %  sodium chloride infusion   Intravenous Continuous Avon Gully, MD 50 mL/hr at 09/22/12 1249    . acetaminophen (TYLENOL) tablet 500 mg  500 mg Oral Q4H PRN Milana Obey, MD   500 mg at 09/22/12 2130  . ipratropium (ATROVENT) nebulizer solution 0.5 mg  0.5 mg Nebulization BID Avon Gully, MD   0.5 mg at 09/24/12 0819   And  . albuterol (PROVENTIL) (5 MG/ML) 0.5% nebulizer solution 2.5 mg  2.5 mg  Nebulization BID Avon Gully, MD   2.5 mg at 09/24/12 0819  . albuterol (PROVENTIL) (5 MG/ML) 0.5% nebulizer solution 2.5 mg  2.5 mg Nebulization Q6H PRN Avon Gully, MD      . cefTRIAXone (ROCEPHIN) 1 g in dextrose 5 % 50 mL IVPB  1 g Intravenous Q24H Isabella Stalling, MD   1 g at 09/23/12 2144  . enoxaparin (LOVENOX) injection 40 mg  40 mg Subcutaneous QHS Isabella Stalling, MD   40 mg at 09/23/12 2144  . ferrous sulfate 300 (60 FE) MG/5ML syrup 300 mg  300 mg Oral BID WC Avon Gully, MD      . guaiFENesin (MUCINEX) 12 hr tablet 1,200 mg  1,200 mg Oral BID Fredirick Maudlin, MD   1,200 mg at 09/23/12 2144  . LORazepam (ATIVAN) tablet 0.5 mg  0.5 mg Oral Q6H PRN Isabella Stalling, MD   0.5 mg at 09/20/12 1638  . ondansetron (ZOFRAN) injection 4 mg  4 mg Intravenous Q6H PRN Isabella Stalling, MD      . pantoprazole (PROTONIX) EC tablet 40 mg  40 mg Oral Q1200 Isabella Stalling, MD   40 mg at 09/23/12 0809  . penicillin g benzathine (BICILLIN LA) 1200000 UNIT/2ML injection 2.4 Million Units  2.4 Million Units Intramuscular Weekly Avon Gully, MD   2.4 Million Units at 09/23/12 1221  . predniSONE (DELTASONE) tablet 40 mg  40 mg Oral Q breakfast Fredirick Maudlin, MD   40 mg at 09/23/12 0809  . sodium chloride 0.9 % bolus 250 mL  250 mL Intravenous Once Laray Anger, DO        Allergies as of 09/18/2012  . (No Known Allergies)    Family History  Problem Relation Age of Onset  . Colon cancer Neg Hx     History   Social History  . Marital Status: Single    Spouse Name: N/A    Number of Children: N/A  . Years of Education: N/A   Occupational History  . Not on file.   Social History Main Topics  . Smoking status: Current Every Day Smoker -- 0.50 packs/day for 60 years    Types: Cigarettes  . Smokeless tobacco: Never Used  . Alcohol Use: 24.0 oz/week    40 Shots of liquor per week     Comment: Drinks at least a pint of gin or vodka a day  . Drug Use: No  . Sexual  Activity: Not on file   Other Topics Concern  . Not on file   Social History Narrative   Lives alone. 1 son, age 39.    Review of Systems: Gen: see HPI CV: Denies chest pain, heart palpitations, syncope, edema  Resp: see HPI GI: see HPI GU :  Denies urinary burning, urinary frequency, urinary incontinence.  MS: +hip pain  Derm: Denies rash, itching, dry skin Psych: "down" because he isn't going home today . Heme: Denies bruising, bleeding, and enlarged lymph nodes.  Physical Exam: Vital signs in last 24 hours: Temp:  [97.4 F (36.3 C)-98.6 F (37 C)] 97.4 F (36.3 C) (09/23 0635) Pulse Rate:  [62-100] 62 (09/23 0635) Resp:  [20] 20 (09/23 0635) BP: (134-152)/(78-85) 152/78 mmHg (09/23 0635) SpO2:  [98 %-100 %] 100 % (09/23 0635) Last BM Date: 09/23/12 General:   Alert, hair disheveled, appears thin but not cachectic Head:  Normocephalic and atraumatic. Eyes:  Sclera clear, no icterus.   Conjunctiva pink. Ears:  Normal auditory acuity. Nose:  No deformity, discharge,  or lesions. Mouth:  No deformity or lesions, poor dentition Neck:  Supple; no masses or thyromegaly. Lungs:  Clear throughout to auscultation.   No wheezes, crackles, or rhonchi. No acute distress. Heart:  S1 S2 present; no murmurs appreciated Abdomen:  Soft, nontender and nondistended. No massess appreciated, no rebound or guarding. Limited exam as patient undergoing blood draw and unable to recline back at time of consultation Rectal:  Deferred    Msk:  Symmetrical without gross deformities. Normal posture. Extremities:  Without  edema. Neurologic:  Alert and  oriented x4;  grossly normal neurologically. Skin:  Intact without significant lesions or rashes. Cervical Nodes:  No significant cervical adenopathy. Psych:  Alert and cooperative. Normal mood and affect.  Intake/Output from previous day: 09/22 0701 - 09/23 0700 In: 840 [P.O.:840] Out: 2201 [Urine:2200; Stool:1] Intake/Output this shift:     Lab Results:  Recent Labs  09/22/12 0457 09/24/12 0629  WBC  --  4.4  HGB 8.3* 7.8*  HCT 24.6* 23.2*  PLT  --  187   Lab Results  Component Value Date   IRON 18* 09/18/2012   TIBC 314 09/18/2012   FERRITIN 13* 09/18/2012    PT/INR  Recent Labs  09/22/12 0454  LABPROT 13.2  INR 1.02    Impression: 79 year old male with history of ETOH abuse and multiple medical issues, admitted with a UTI, orthostatic hypotension, and found to have acute on chronic anemia while hospitalized. Possible melena reported prior to admission, but no bright red blood per rectum. Unclear when or if he has ever had a colonoscopy, although he states one might have been performed at [facility] in the remote past.   Concern for upper GI etiology to include gastritis, PUD in the setting of intermittent NSAID use and as an outpatient. Chronic ETOH abuse could contribute to ETOH gastritis. Anemia is multifactorial with an element of chronic disease and evidence of IDA. Needs EGD this admission with outpatient colonoscopy in near future. No need for urgent lower GI evaluation at this time unless evidence of overt rectal bleeding.   Plan: 1. EGD on 9/24 with Dr. Jena Gauss. Will attempt with Phenergan to augment sedation. Risks and benefits discussed in detail with patient, who states understanding. 2. Outpatient colonoscopy 3. Hold Lovenox dose for now: apply SCDs 4. Hemoccult stool 5. Continue Protonix but change to 0700 daily 6. NPO after midnight 7. Agree with 2 units PRBCs 8. ETOH cessation strongly advised  Nira Retort, ANP-BC [facility]       LOS: 6 days    09/24/2012, 8:48 AM   Attending note:  Patient seen and examined this evening. Agree with need for EGD. Discussed the risks and benefits with the patient. He's agreeable. We'll plan for endoscopic  evaluation tomorrow.

## 2012-09-24 NOTE — Progress Notes (Signed)
Subjective: Patient is resting. He refusing to get blood test. He is started treatment for tertiary syphilis because his RPR was positive and also has elevated TPA titer. His hemoglobin dropped below  8. His ferreting is also low. Objective: Vital signs in last 24 hours: Temp:  [97.4 F (36.3 C)-98.6 F (37 C)] 97.4 F (36.3 C) (09/23 0635) Pulse Rate:  [62-100] 62 (09/23 0635) Resp:  [20] 20 (09/23 0635) BP: (134-152)/(78-85) 152/78 mmHg (09/23 0635) SpO2:  [98 %-100 %] 100 % (09/23 7829) Weight change:  Last BM Date: 09/23/12  Intake/Output from previous day: 09/22 0701 - 09/23 0700 In: 840 [P.O.:840] Out: 2201 [Urine:2200; Stool:1]  PHYSICAL EXAM General appearance: alert, cachectic and no distress Resp: clear to auscultation bilaterally Cardio: S1, S2 normal GI: soft, non-tender; bowel sounds normal; no masses,  no organomegaly Extremities: extremities normal, atraumatic, no cyanosis or edema  Lab Results:    @labtest @ [facility] No results found for this basename: PHART, PCO2, PO2ART, TCO2, HCO3,  in the last 72 hours CULTURES Recent Results (from the past 240 hour(s))  URINE CULTURE     Status: None   Collection Time    09/18/12  7:47 PM      Result Value Range Status   Specimen Description URINE, CLEAN CATCH   Final   Special Requests NONE   Final   Culture  Setup Time     Final   Value: 09/19/2012 14:07     Performed at [facility] Count     Final   Value: >=100,000 COLONIES/ML     Performed at [facility]   Culture     Final   Value: ESCHERICHIA COLI     PROTEUS MIRABILIS     Performed at [facility]   Report Status 09/22/2012 FINAL   Final   Organism ID, Bacteria ESCHERICHIA COLI   Final   Organism ID, Bacteria PROTEUS MIRABILIS   Final  CULTURE, BLOOD (ROUTINE X 2)     Status: None   Collection Time    09/18/12  9:25 PM      Result Value Range Status   Specimen Description BLOOD RIGHT ANTECUBITAL   Final   Special  Requests BOTTLES DRAWN AEROBIC AND ANAEROBIC 10CC EACH   Final   Culture NO GROWTH 5 DAYS   Final   Report Status 09/23/2012 FINAL   Final  CULTURE, BLOOD (ROUTINE X 2)     Status: None   Collection Time    09/18/12  9:38 PM      Result Value Range Status   Specimen Description BLOOD RIGHT HAND   Final   Special Requests BOTTLES DRAWN AEROBIC AND ANAEROBIC [facility] EACH   Final   Culture NO GROWTH 5 DAYS   Final   Report Status 09/23/2012 FINAL   Final   Studies/Results: No results found.  Medications: I have reviewed the patient's current medications.  Assesment: 1. UTI  2. Orthostatic Hypotension  3. Alcohol Abuse  4. H/O COPD  5. H/O DVT  6. Severe Protein Calory malnutrition  7. Anemia etiology unclear 8. Tertiary syphilis   Active Problems:   * No active hospital problems. *    Plan: Continue IV hydration Continue IV antibiotics Will transfuse 2 units of PRBC GI consult Occult stool test Supportive care.    LOS: 6 days   Naythen Heikkila 09/24/2012, 7:57 AM

## 2012-09-25 ENCOUNTER — Encounter (HOSPITAL_COMMUNITY): Payer: Self-pay | Admitting: *Deleted

## 2012-09-25 ENCOUNTER — Encounter (HOSPITAL_COMMUNITY)
Admission: EM | Disposition: A | Discharge status: Home Health | Payer: Self-pay | Source: Home / Self Care | Attending: Internal Medicine

## 2012-09-25 DIAGNOSIS — K449 Diaphragmatic hernia without obstruction or gangrene: Secondary | ICD-10-CM

## 2012-09-25 DIAGNOSIS — K2940 Chronic atrophic gastritis without bleeding: Secondary | ICD-10-CM

## 2012-09-25 DIAGNOSIS — D649 Anemia, unspecified: Secondary | ICD-10-CM

## 2012-09-25 HISTORY — PX: ESOPHAGOGASTRODUODENOSCOPY: SHX5428

## 2012-09-25 LAB — CBC
MCH: 30.4 pg (ref 26.0–34.0)
MCHC: 34.7 g/dL (ref 30.0–36.0)
Platelets: 175 10*3/uL (ref 150–400)
RBC: 3.35 MIL/uL — ABNORMAL LOW (ref 4.22–5.81)
WBC: 5.7 10*3/uL (ref 4.0–10.5)

## 2012-09-25 LAB — TYPE AND SCREEN: Unit division: 0

## 2012-09-25 SURGERY — EGD (ESOPHAGOGASTRODUODENOSCOPY)
Anesthesia: Moderate Sedation

## 2012-09-25 MED ORDER — ENSURE PUDDING PO PUDG: 1.0000 | Freq: Three times a day (TID) | ORAL | Status: DC

## 2012-09-25 MED ORDER — MIDAZOLAM HCL 5 MG/5ML IJ SOLN
INTRAMUSCULAR | Status: DC | PRN
  Administered 2012-09-25: 2 mg via INTRAVENOUS

## 2012-09-25 MED ORDER — BUTAMBEN-TETRACAINE-BENZOCAINE 2-2-14 % EX AERO
INHALATION_SPRAY | CUTANEOUS | Status: DC | PRN
  Administered 2012-09-25: 2 via TOPICAL

## 2012-09-25 MED ORDER — STERILE WATER FOR IRRIGATION IR SOLN
Status: DC | PRN
  Administered 2012-09-25: 10:00:00

## 2012-09-25 MED ORDER — MIDAZOLAM HCL 5 MG/5ML IJ SOLN
INTRAMUSCULAR | Status: AC
  Filled 2012-09-25: qty 10

## 2012-09-25 MED ORDER — ENSURE COMPLETE PO LIQD
237.0000 mL | Freq: Two times a day (BID) | ORAL | Status: DC
  Administered 2012-09-26: 237 mL via ORAL

## 2012-09-25 MED ORDER — ONDANSETRON HCL 4 MG/2ML IJ SOLN
INTRAMUSCULAR | Status: AC
  Filled 2012-09-25: qty 2

## 2012-09-25 MED ORDER — MEPERIDINE HCL 100 MG/ML IJ SOLN
INTRAMUSCULAR | Status: DC | PRN
  Administered 2012-09-25: 50 mg via INTRAVENOUS

## 2012-09-25 MED ORDER — ONDANSETRON HCL 4 MG/2ML IJ SOLN
INTRAMUSCULAR | Status: DC | PRN
  Administered 2012-09-25: 4 mg via INTRAVENOUS

## 2012-09-25 MED ORDER — MEPERIDINE HCL 100 MG/ML IJ SOLN
INTRAMUSCULAR | Status: AC
  Filled 2012-09-25: qty 2

## 2012-09-25 MED ORDER — PROMETHAZINE HCL 25 MG/ML IJ SOLN
INTRAMUSCULAR | Status: AC
  Filled 2012-09-25: qty 1

## 2012-09-25 MED ORDER — SODIUM CHLORIDE 0.9 % IJ SOLN
INTRAMUSCULAR | Status: AC
  Filled 2012-09-25: qty 10

## 2012-09-25 NOTE — Progress Notes (Signed)
Subjective: Patient got 2 units of PRBC. His occult blood is +. He is scheduled for EGD today. Vital signs in last 24 hours: Temp:  [97.5 F (36.4 C)-98.6 F (37 C)] 98.2 F (36.8 C) (09/24 0643) Pulse Rate:  [60-93] 60 (09/24 0643) Resp:  [18-20] 20 (09/24 0643) BP: (102-155)/(70-90) 155/79 mmHg (09/24 0643) SpO2:  [96 %-99 %] 98 % (09/24 0732) Weight change:  Last BM Date: 09/24/12  Intake/Output from previous day: 09/23 0701 - 09/24 0700 In: 1820.8 [P.O.:720; I.V.:750; Blood:350.8] Out: -   PHYSICAL EXAM General appearance: alert, cachectic and no distress Resp: clear to auscultation bilaterally Cardio: S1, S2 normal GI: soft, non-tender; bowel sounds normal; no masses,  no organomegaly Extremities: extremities normal, atraumatic, no cyanosis or edema  Lab Results:    @labtest @ [facility] No results found for this basename: PHART, PCO2, PO2ART, TCO2, HCO3,  in the last 72 hours CULTURES Recent Results (from the past 240 hour(s))  URINE CULTURE     Status: None   Collection Time    09/18/12  7:47 PM      Result Value Range Status   Specimen Description URINE, CLEAN CATCH   Final   Special Requests NONE   Final   Culture  Setup Time     Final   Value: 09/19/2012 14:07     Performed at [facility] Count     Final   Value: >=100,000 COLONIES/ML     Performed at [facility]   Culture     Final   Value: ESCHERICHIA COLI     PROTEUS MIRABILIS     Performed at [facility]   Report Status 09/22/2012 FINAL   Final   Organism ID, Bacteria ESCHERICHIA COLI   Final   Organism ID, Bacteria PROTEUS MIRABILIS   Final  CULTURE, BLOOD (ROUTINE X 2)     Status: None   Collection Time    09/18/12  9:25 PM      Result Value Range Status   Specimen Description BLOOD RIGHT ANTECUBITAL   Final   Special Requests BOTTLES DRAWN AEROBIC AND ANAEROBIC 10CC EACH   Final   Culture NO GROWTH 5 DAYS   Final   Report Status 09/23/2012 FINAL   Final   CULTURE, BLOOD (ROUTINE X 2)     Status: None   Collection Time    09/18/12  9:38 PM      Result Value Range Status   Specimen Description BLOOD RIGHT HAND   Final   Special Requests BOTTLES DRAWN AEROBIC AND ANAEROBIC [facility] EACH   Final   Culture NO GROWTH 5 DAYS   Final   Report Status 09/23/2012 FINAL   Final   Studies/Results: No results found.  Medications: I have reviewed the patient's current medications.  Assesment: 1. UTI  2. Orthostatic Hypotension  3. Alcohol Abuse  4. H/O COPD  5. H/O DVT  6. Severe Protein Calory malnutrition  7. Anemia etiology unclear 8. Tertiary syphilis   Active Problems:   * No active hospital problems. *    Plan: Continue IV hydration Continue IV antibiotics GI consult appreciated EGD as planned. Supportive care.    LOS: 7 days   Jermaine [name] 09/25/2012, 8:23 AM

## 2012-09-25 NOTE — Progress Notes (Signed)
Patient confused, disoriented to place and time, trying to get OOB, disoriented to situation, safety sitter requested for safety of patient, bed alarm on, safety sitter at bedside.

## 2012-09-25 NOTE — Op Note (Signed)
[facility] [address] [place], [address]   ENDOSCOPY PROCEDURE REPORT  PATIENT: [name], Jermaine  MR#: [id number] BIRTHDATE: [birth year] , 80  yrs. old GENDER: Male ENDOSCOPIST: R.  Roetta Sessions, MD FACP FACG REFERRED BY:  Glenice Laine, M.D. PROCEDURE DATE:  09/25/2012 PROCEDURE:     EGD with gastric biopsy  INDICATIONS:    Reported melena; acute on chronic anemia  INFORMED CONSENT:   The risks, benefits, limitations, alternatives and imponderables have been discussed.  The potential for biopsy, esophogeal dilation, etc. have also been reviewed.  Questions have been answered.  All parties agreeable.  Please see the history and physical in the medical record for more information.  MEDICATIONS:  Versed 2 mg IV and Demerol 50 mg IV ; Phenergan 12.5 mg IV and Zofran 4 mg. Cetacaine spray.  DESCRIPTION OF PROCEDURE:   The EG-[id number] ([id number])  endoscope was introduced through the mouth and advanced to the second portion of the duodenum without difficulty or limitations.  The mucosal surfaces were surveyed very carefully during advancement of the scope and upon withdrawal.  Retroflexion view of the proximal stomach and esophagogastric junction was performed.      FINDINGS:  Normal esophagus/no varices. Stomach empty. 3 cm hiatal hernia. Normal-appearing gastric mucosa. Patent pylorus. Examination of bulb and second portion revealed duodenal bulbar erosions only.  THERAPEUTIC / DIAGNOSTIC MANEUVERS PERFORMED:  Biopsies of the gastric mucosa taken to assess for H. pylori infection   COMPLICATIONS:  None  IMPRESSION:  Hiatal hernia; duodenal bulbar erosions-status post gastric biopsy  RECOMMENDATIONS:    Followup on pathology. We will arrange for an outpatient colonoscopy. Thanks for consult    _______________________________ R. Roetta Sessions, MD FACP [facility] eSigned:  R. Roetta Sessions, MD FACP [place] 09/25/2012 10:26 AM     CC:

## 2012-09-26 ENCOUNTER — Telehealth: Payer: Self-pay | Admitting: Gastroenterology

## 2012-09-26 ENCOUNTER — Encounter: Payer: Self-pay | Admitting: Internal Medicine

## 2012-09-26 ENCOUNTER — Encounter (HOSPITAL_COMMUNITY): Payer: Self-pay | Admitting: Internal Medicine

## 2012-09-26 LAB — HEMOGLOBIN AND HEMATOCRIT, BLOOD
HCT: 30 % — ABNORMAL LOW (ref 39.0–52.0)
Hemoglobin: 10.2 g/dL — ABNORMAL LOW (ref 13.0–17.0)

## 2012-09-26 MED ORDER — PENICILLIN G BENZATHINE 1200000 UNIT/2ML IM SUSP: 2.4000 10*6.[IU] | INTRAMUSCULAR | Status: DC

## 2012-09-26 MED ORDER — CIPROFLOXACIN HCL 500 MG PO TABS: 500.0000 mg | ORAL_TABLET | Freq: Two times a day (BID) | ORAL | Status: DC

## 2012-09-26 MED ORDER — PREDNISONE 20 MG PO TABS: 10.0000 mg | ORAL_TABLET | Freq: Every day | ORAL | Status: DC

## 2012-09-26 MED ORDER — PANTOPRAZOLE SODIUM 40 MG PO TBEC: 40.0000 mg | DELAYED_RELEASE_TABLET | Freq: Every day | ORAL | Status: DC

## 2012-09-26 NOTE — Telephone Encounter (Signed)
Please arrange outpatient visit with myself to discuss colonoscopy. Recently discharged from hospital

## 2012-09-26 NOTE — Telephone Encounter (Signed)
Pt aware of OV on 10/16 at 2 with AS and appt card was mailed

## 2012-09-26 NOTE — Progress Notes (Signed)
UR Chart Review Completed  

## 2012-09-26 NOTE — Discharge Summary (Signed)
Physician Discharge Summary  Patient ID: Jermaine [name] MRN: [id number] DOB/AGE: 1931/02/16 79 y.o. Primary Care Physician:Cristal Qadir, MD Admit date: 09/18/2012 Discharge date: 09/26/2012    Discharge Diagnoses:  1. UTI  2. Orthostatic Hypotension  3. Alcohol Abuse  4. H/O COPD  5. H/O DVT  6. Severe Protein Calory malnutrition  7. Anemia Secondary to acute blood loss 8. Tertiary syphilis 9. GI bleed (duodenal bulbar erosions)    Active Problems:   * No active hospital problems. *     Medication List         Acetaminophen 500 MG coapsule  Take 500 mg by mouth as needed for fever or pain.     albuterol (2.5 MG/3ML) 0.083% nebulizer solution  Commonly known as:  PROVENTIL  Take 2.5 mg by nebulization every 6 (six) hours as needed. For shortness of breath     ciprofloxacin 500 MG tablet  Commonly known as:  CIPRO  Take 1 tablet (500 mg total) by mouth 2 (two) times daily.     pantoprazole 40 MG tablet  Commonly known as:  PROTONIX  Take 1 tablet (40 mg total) by mouth daily at 12 noon.     penicillin g benzathine 1200000 UNIT/2ML Susp injection  Commonly known as:  BICILLIN LA  Inject 4 mLs (2.4 Million Units total) into the muscle once a week.     predniSONE 20 MG tablet  Commonly known as:  DELTASONE  Take 0.5 tablets (10 mg total) by mouth daily with breakfast.        Discharged Condition: Stable     Consults: GI  Significant Diagnostic Studies: Dg Chest 1 View  09/18/2012   CLINICAL DATA:  Hypertension, shortness of Breath.  EXAM: CHEST - 1 VIEW  COMPARISON:  07/18/2012  IMPRESSION: Hyperinflation and chronic interstitial changes. No acute disease.   Electronically Signed   By: Oley Balm M.D.   On: 09/18/2012 20:15    Lab Results: Basic Metabolic Panel: No results found for this basename: NA, K, CL, CO2, GLUCOSE, BUN, CREATININE, CALCIUM, MG, PHOS,  in the last 72 hours Liver Function Tests: No results found for this basename: AST, ALT,  ALKPHOS, BILITOT, PROT, ALBUMIN,  in the last 72 hours   CBC:  Recent Labs  09/24/12 0629 09/25/12 0632 09/26/12 0546  WBC 4.4 5.7  --   HGB 7.8* 10.2* 10.2*  HCT 23.2* 29.4* 30.0*  MCV 87.9 87.8  --   PLT 187 175  --     Recent Results (from the past 240 hour(s))  URINE CULTURE     Status: None   Collection Time    09/18/12  7:47 PM      Result Value Range Status   Specimen Description URINE, CLEAN CATCH   Final   Special Requests NONE   Final   Culture  Setup Time     Final   Value: 09/19/2012 14:07     Performed at [facility] Count     Final   Value: >=100,000 COLONIES/ML     Performed at [facility]   Culture     Final   Value: ESCHERICHIA COLI     PROTEUS MIRABILIS     Performed at [facility]   Report Status 09/22/2012 FINAL   Final   Organism ID, Bacteria ESCHERICHIA COLI   Final   Organism ID, Bacteria PROTEUS MIRABILIS   Final  CULTURE, BLOOD (ROUTINE X 2)     Status: None  Collection Time    09/18/12  9:25 PM      Result Value Range Status   Specimen Description BLOOD RIGHT ANTECUBITAL   Final   Special Requests BOTTLES DRAWN AEROBIC AND ANAEROBIC 10CC EACH   Final   Culture NO GROWTH 5 DAYS   Final   Report Status 09/23/2012 FINAL   Final  CULTURE, BLOOD (ROUTINE X 2)     Status: None   Collection Time    09/18/12  9:38 PM      Result Value Range Status   Specimen Description BLOOD RIGHT HAND   Final   Special Requests BOTTLES DRAWN AEROBIC AND ANAEROBIC [facility] EACH   Final   Culture NO GROWTH 5 DAYS   Final   Report Status 09/23/2012 FINAL   Final     Hospital Course:  This is a 79 years old male patient with history of multiple medical illnesses was admitted due to shortness of breath and generalized weakness. He was found to have UTI and COPD with exacerbation. Patient was started on iv antibiotics and nebulizer treatment. Patient dropped his H/H while in hospital and was found to have GI bleed. GI consult was  done EDG performed patient was found to have duodenal pulp erosion. He was transfused 2 units of PRBC and patient remained stable. He will be discharged with home health.   Discharge Exam: Blood pressure 154/84, pulse 59, temperature 97.7 F (36.5 C), temperature source Oral, resp. rate 20, height 5\' 11"  (1.803 m), weight 54.432 kg (120 lb), SpO2 98.00%.   Disposition:  home        Follow-up Information   Follow up with [facility]. (RN, PT, Aide, SW)    Contact information:   [address] [place] [address] [phone]      Follow up with [facility], [facility] In 1 week.   Specialty:  Internal Medicine   Contact information:   [address] [place] [address] [phone]       Signed: Avon Gully  09/26/2012, 8:23 AM

## 2012-09-30 ENCOUNTER — Encounter: Payer: Self-pay | Admitting: Internal Medicine

## 2012-10-17 ENCOUNTER — Other Ambulatory Visit: Payer: Self-pay | Admitting: Internal Medicine

## 2012-10-17 ENCOUNTER — Ambulatory Visit (INDEPENDENT_AMBULATORY_CARE_PROVIDER_SITE_OTHER): Payer: Medicare Other | Admitting: Gastroenterology

## 2012-10-17 ENCOUNTER — Encounter: Payer: Self-pay | Admitting: Gastroenterology

## 2012-10-17 VITALS — BP 119/75 | HR 67 | Temp 98.3°F | Ht 71.0 in | Wt 113.6 lb

## 2012-10-17 DIAGNOSIS — D649 Anemia, unspecified: Secondary | ICD-10-CM

## 2012-10-17 DIAGNOSIS — R195 Other fecal abnormalities: Secondary | ICD-10-CM

## 2012-10-17 LAB — CBC
HCT: 38 % — ABNORMAL LOW (ref 39.0–52.0)
Hemoglobin: 12.7 g/dL — ABNORMAL LOW (ref 13.0–17.0)
RBC: 4.36 MIL/uL (ref 4.22–5.81)
WBC: 6.5 10*3/uL (ref 4.0–10.5)

## 2012-10-17 MED ORDER — PEG 3350-KCL-NA BICARB-NACL 420 G PO SOLR: 4000.0000 mL | ORAL | Status: DC

## 2012-10-17 NOTE — Assessment & Plan Note (Signed)
Recently inpatient due to acute on chronic anemia in the setting of intermittent NSAIDs and chronic ETOH abuse. EGD only performed with revealed hiatal hernia and duodenal bulbar erosions. Negative H.pylori on path. Last colonoscopy, if he has ever had one, is unclear. Recommend lower GI evaluation due to anemia and heme positive stool. As of note, no rectal bleeding or changes in bowel habits. He does note intermittent black stool; it is unclear if this is true melena. May ultimately need capsule study to complete work-up.  Proceed with TCS with Dr. Jena [name] in near future: the risks, benefits, and alternatives have been discussed with the patient in detail. The patient states understanding and desires to proceed. Phenergan 12.5 mg IV on call due to hx ETOH abuse Consider capsule  Recheck CBC, iron, ferritin today. Add H.pylori serology

## 2012-10-17 NOTE — Patient Instructions (Signed)
Please have blood work done. We will call with the results.  We are setting you up for a colonoscopy with Dr. Jena Gauss in the near future. Further recommendations to follow!

## 2012-10-17 NOTE — Assessment & Plan Note (Signed)
TCS as planned.  

## 2012-10-17 NOTE — Progress Notes (Signed)
Referring Provider: Fanta, Tesfaye, MD Primary Care Physician:  FANTA,TESFAYE, MD Primary GI: Dr. Rourk   Chief Complaint  Patient presents with  . Follow-up    HPI:   Jermaine [name] returns today in hospital follow-up to discuss a colonoscopy. He was seen in Sept 2014 with acute on chronic anemia in the setting of intermittent NSAID use and ETOH abuse. EGD during admission revealed hiatal hernia with duodenal bulbar erosions. Gastric biopsy negative for H.pylori. Heme positive during admission.  No abdominal pain. Only joint discomfort. Denies constipation or diarrhea. No rectal bleeding. States intermittent black stool. On Prednisone. Good appetite. Weight has actually increased. Unclear if he has ever had a colonoscopy; if so, this was in the remote past.    Past Medical History  Diagnosis Date  . COPD (chronic obstructive pulmonary disease)   . Chronic hip pain     Avascular necrosis, bilateral hips.  . DVT (deep venous thrombosis)   . PE (pulmonary embolism) May/2012.  . Alcohol abuse   . Elevated PSA     Lost to followup.  . Macrocytic anemia   . Lytic lesion of bone on x-ray May/2012.    Negative bone scan.  . Bilateral pneumonia May/2012.  . Thoracic compression fracture   . Tobacco abuse   . Alcohol withdrawal syndrome 06/10/2011  . Delirium, drug-induced 06/2011    From Ativan  . Abnormal thyroid function test 06/2011.    Normal TSH and slightly low free T4.  . Hypoglycemia   . Dysphagia   . Syncope and collapse   . Hematuria   . Spinal stenosis   . Cancer     malignant neoplasm of prostate  . Venous thrombosis   . 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 Chronic back pain     Past Surgical History  Procedure Laterality Date  . Total hip arthroplasty      Left hip.  . Tonsillectomy      as child  . Cystoscopy  10/17/2011    Procedure: CYSTOSCOPY;  Surgeon: John J Wrenn, MD;  Location: [facility];  Service: Urology;  Laterality: N/A;   Cystoscopy/Orchiectomy/Transrectal Ultrasound Guided Prostate Biopsy  . Orchiectomy  10/17/2011    Procedure: ORCHIECTOMY;  Surgeon: John J Wrenn, MD;  Location: [facility];  Service: Urology;  Laterality: Bilateral;  . Prostate biopsy  10/17/2011    Procedure: BIOPSY TRANSRECTAL ULTRASONIC PROSTATE (TUBP);  Surgeon: John J Wrenn, MD;  Location: [facility];  Service: Urology;  Laterality: N/A;  . Esophagogastroduodenoscopy N/A 09/25/2012    RMR:Hiatal hernia; duodenal bulbar erosions-status post gastric biopsy, Revealed moderate chronic inactive gastritis, negative H.pylori    Current Outpatient Prescriptions  Medication Sig Dispense Refill  . Acetaminophen 500 MG coapsule Take 500 mg by mouth as needed for fever or pain.      . albuterol (PROVENTIL) (2.5 MG/3ML) 0.083% nebulizer solution Take 2.5 mg by nebulization every 6 (six) hours as needed. For shortness of breath      . pantoprazole (PROTONIX) 40 MG tablet Take 1 tablet (40 mg total) by mouth daily at 12 noon.  30 tablet  3  . penicillin g benzathine (BICILLIN LA) 1200000 UNIT/2ML SUSP injection Inject 4 mLs (2.4 Million Units total) into the muscle once a week.  2 mL  2  . predniSONE (DELTASONE) 20 MG tablet Take 0.5 tablets (10 mg total) by mouth daily with breakfast.  20 tablet  0   No current facility-administered medications for this visit.      Allergies as of 10/17/2012  . (No Known Allergies)    Family History  Problem Relation Age of Onset  . Colon cancer Neg Hx     History   Social History  . Marital Status: Single    Spouse Name: N/A    Number of Children: N/A  . Years of Education: N/A   Social History Main Topics  . Smoking status: Current Every Day Smoker -- 0.50 packs/day for 60 years    Types: Cigarettes  . Smokeless tobacco: Never Used  . Alcohol Use: 24.0 oz/week    40 Shots of liquor per week     Comment: Drinks at least a pint of gin or vodka a day  . Drug Use: No  . Sexual Activity: None   Other Topics  Concern  . None   Social History Narrative   Lives alone. 1 son, age 45.    Review of Systems: Gen: Denies fever, chills, anorexia. Denies fatigue, weakness, weight loss.  CV: Denies chest pain, palpitations, syncope, peripheral edema, and claudication. Resp: +DOE GI: see HPI Derm: Denies rash, itching, dry skin Psych: Denies depression, anxiety, memory loss, confusion. No homicidal or suicidal ideation.  Heme: Denies bruising, bleeding, and enlarged lymph nodes.  Physical Exam: BP 119/75  Pulse 67  Temp(Src) 98.3 F (36.8 C) (Oral)  Ht 5' 11" (1.803 m)  Wt 113 lb 9.6 oz (51.529 kg)  BMI 15.85 kg/m2 General:   Alert and oriented. No distress noted. Pleasant and cooperative.  Head:  Normocephalic and atraumatic. Eyes:  Conjuctiva clear without scleral icterus. Mouth:  Oral mucosa pink and moist. Heart:  S1, S2 present Abdomen:  +BS, soft, non-tender and non-distended. No rebound or guarding. No HSM or masses noted. Msk:  Slight kyphosis Extremities:  Without edema. Neurologic:  Alert and  oriented x4;  grossly normal neurologically. Skin:  Intact without significant lesions or rashes. Psych:  Alert and cooperative. Normal mood and affect.   Lab Results  Component Value Date   WBC 5.7 09/25/2012   HGB 10.2* 09/26/2012   HCT 30.0* 09/26/2012   MCV 87.8 09/25/2012   PLT 175 09/25/2012     

## 2012-10-18 LAB — FERRITIN: Ferritin: 78 ng/mL (ref 22–322)

## 2012-10-18 LAB — H. PYLORI ANTIBODY, IGG: H Pylori IgG: 1.63 {ISR} — ABNORMAL HIGH

## 2012-10-18 LAB — IRON: Iron: 53 ug/dL (ref 42–165)

## 2012-10-21 NOTE — Progress Notes (Signed)
cc'd to pcp 

## 2012-10-23 ENCOUNTER — Other Ambulatory Visit: Payer: Self-pay | Admitting: Gastroenterology

## 2012-10-23 ENCOUNTER — Encounter (HOSPITAL_COMMUNITY): Payer: Self-pay | Admitting: Pharmacy Technician

## 2012-10-23 MED ORDER — AMOXICILL-CLARITHRO-LANSOPRAZ PO MISC: Freq: Two times a day (BID) | ORAL | Status: DC

## 2012-10-23 NOTE — Progress Notes (Signed)
Quick Note:  Hgb improving from 10 to 12.  Iron and ferritin levels improved and actually normal now.  H.plyori serology positive.  Let's treat with Prevpac. Hold Protonix while on Prevpac. I have sent this to the pharmacy. ______

## 2012-11-06 ENCOUNTER — Encounter (HOSPITAL_COMMUNITY)
Admission: RE | Disposition: A | Discharge status: Home / Self Care | Payer: Self-pay | Source: Ambulatory Visit | Attending: Internal Medicine

## 2012-11-06 ENCOUNTER — Ambulatory Visit (HOSPITAL_COMMUNITY)
Admission: RE | Admit: 2012-11-06 | Discharge: 2012-11-06 | Disposition: A | Discharge status: Home / Self Care | Payer: Medicare Other | Source: Ambulatory Visit | Attending: Internal Medicine | Admitting: Internal Medicine

## 2012-11-06 ENCOUNTER — Encounter (HOSPITAL_COMMUNITY): Payer: Self-pay | Admitting: *Deleted

## 2012-11-06 DIAGNOSIS — R195 Other fecal abnormalities: Secondary | ICD-10-CM | POA: Insufficient documentation

## 2012-11-06 DIAGNOSIS — K5730 Diverticulosis of large intestine without perforation or abscess without bleeding: Secondary | ICD-10-CM

## 2012-11-06 DIAGNOSIS — J4489 Other specified chronic obstructive pulmonary disease: Secondary | ICD-10-CM | POA: Insufficient documentation

## 2012-11-06 DIAGNOSIS — D134 Benign neoplasm of liver: Secondary | ICD-10-CM

## 2012-11-06 DIAGNOSIS — D126 Benign neoplasm of colon, unspecified: Secondary | ICD-10-CM | POA: Insufficient documentation

## 2012-11-06 DIAGNOSIS — D649 Anemia, unspecified: Secondary | ICD-10-CM

## 2012-11-06 DIAGNOSIS — D135 Benign neoplasm of extrahepatic bile ducts: Secondary | ICD-10-CM

## 2012-11-06 DIAGNOSIS — J449 Chronic obstructive pulmonary disease, unspecified: Secondary | ICD-10-CM | POA: Insufficient documentation

## 2012-11-06 HISTORY — PX: COLONOSCOPY: SHX5424

## 2012-11-06 SURGERY — COLONOSCOPY
Anesthesia: Moderate Sedation

## 2012-11-06 MED ORDER — ONDANSETRON HCL 4 MG/2ML IJ SOLN
INTRAMUSCULAR | Status: AC
  Filled 2012-11-06: qty 2

## 2012-11-06 MED ORDER — ONDANSETRON HCL 4 MG/2ML IJ SOLN
INTRAMUSCULAR | Status: DC | PRN
  Administered 2012-11-06: 4 mg via INTRAVENOUS

## 2012-11-06 MED ORDER — MIDAZOLAM HCL 5 MG/5ML IJ SOLN
INTRAMUSCULAR | Status: DC | PRN
  Administered 2012-11-06: 1 mg via INTRAVENOUS
  Administered 2012-11-06: 2 mg via INTRAVENOUS

## 2012-11-06 MED ORDER — MEPERIDINE HCL 100 MG/ML IJ SOLN
INTRAMUSCULAR | Status: DC | PRN
  Administered 2012-11-06: 50 mg via INTRAVENOUS

## 2012-11-06 MED ORDER — SODIUM CHLORIDE 0.9 % IJ SOLN
INTRAMUSCULAR | Status: AC
  Filled 2012-11-06: qty 10

## 2012-11-06 MED ORDER — PROMETHAZINE HCL 25 MG/ML IJ SOLN
12.5000 mg | Freq: Once | INTRAMUSCULAR | Status: AC
  Administered 2012-11-06: 12.5 mg via INTRAVENOUS
  Filled 2012-11-06: qty 1

## 2012-11-06 MED ORDER — SODIUM CHLORIDE 0.9 % IV SOLN
INTRAVENOUS | Status: DC
  Administered 2012-11-06: 09:00:00 via INTRAVENOUS

## 2012-11-06 MED ORDER — MEPERIDINE HCL 100 MG/ML IJ SOLN
INTRAMUSCULAR | Status: AC
  Filled 2012-11-06: qty 2

## 2012-11-06 MED ORDER — SIMETHICONE 40 MG/0.6ML PO SUSP
ORAL | Status: DC | PRN
  Administered 2012-11-06: 09:00:00

## 2012-11-06 MED ORDER — MIDAZOLAM HCL 5 MG/5ML IJ SOLN
INTRAMUSCULAR | Status: AC
  Filled 2012-11-06: qty 10

## 2012-11-06 NOTE — Op Note (Signed)
[facility] [address] [place], [address]   COLONOSCOPY PROCEDURE REPORT  PATIENT: Jermaine [name], Jermaine [name]  MR#:         [id number] BIRTHDATE: 18-Oct-1931 , 81  yrs. old GENDER: Male ENDOSCOPIST: R.  Roetta Sessions, MD FACP FACG REFERRED BY:  Glenice Laine, M.D. PROCEDURE DATE:  11/06/2012 PROCEDURE:      Colonoscopy with multiple snare polypectomies  INDICATIONS:   Hemoccult-positive stool  INFORMED CONSENT:  The risks, benefits, alternatives and imponderables including but not limited to bleeding, perforation as well as the possibility of a missed lesion have been reviewed.  The potential for biopsy, lesion removal, etc. have also been discussed.  Questions have been answered.  All parties agreeable. Please see the history and physical in the medical record for more information.  MEDICATIONS:   Versed 3 mg IV and Demerol 50 mg IV in divided doses. Phenergan 12.5 mg IV and Zofran 4 mg IV  DESCRIPTION OF PROCEDURE:  After a digital rectal exam was performed, the EC-3890Li ([id number])  colonoscope was advanced from the anus through the rectum and colon to the area of the cecum, ileocecal valve and appendiceal orifice.  The cecum was deeply intubated.  These structures were well-seen and photographed for the record.  From the level of the cecum and ileocecal valve, the scope was slowly and cautiously withdrawn.  The mucosal surfaces were carefully surveyed utilizing scope tip deflection to facilitate fold flattening as needed.  The scope was pulled down into the rectum where a thorough examination including retroflexion was performed.    FINDINGS:  Adequate preparation. Normal rectum. Scattered left-sided diverticula.  The patient had (3) pedunculated polyps at the junction of the descending and sigmoid segments (about 40 cm from the anal verge). The largest polyp was approximately 1.25 cm in dimensions. Patient also had (2) 8mm polyps at the hepatic  flexure; remainder of colonic mucosa appeared normal.  THERAPEUTIC / DIAGNOSTIC MANEUVERS PERFORMED:  The above-mentioned polyps were hot snare removed and recovered with a Roth net.  COMPLICATIONS: none  CECAL WITHDRAWAL TIME:  32 minutes  IMPRESSION:  Multiple colonic polyps removed as described above. Colonic diverticulosis.  RECOMMENDATIONS: Followup on pathology.   _______________________________ eSigned:  R. Roetta Sessions, MD FACP [facility] 11/06/2012 10:19 AM   CC:    PATIENT NAME:  [name], [name] MR#: [id number]

## 2012-11-06 NOTE — H&P (View-Only) (Signed)
Referring Provider: Avon Gully, MD Primary Care Physician:  Avon Gully, MD Primary GI: Dr. Jena Gauss   Chief Complaint  Patient presents with  . Follow-up    HPI:   Jermaine [name] returns today in hospital follow-up to discuss a colonoscopy. He was seen in Sept 2014 with acute on chronic anemia in the setting of intermittent NSAID use and ETOH abuse. EGD during admission revealed hiatal hernia with duodenal bulbar erosions. Gastric biopsy negative for H.pylori. Heme positive during admission.  No abdominal pain. Only joint discomfort. Denies constipation or diarrhea. No rectal bleeding. States intermittent black stool. On Prednisone. Good appetite. Weight has actually increased. Unclear if he has ever had a colonoscopy; if so, this was in the remote past.    Past Medical History  Diagnosis Date  . COPD (chronic obstructive pulmonary disease)   . Chronic hip pain     Avascular necrosis, bilateral hips.  . DVT (deep venous thrombosis)   . PE (pulmonary embolism) May/2012.  Marland Kitchen Alcohol abuse   . Elevated PSA     Lost to followup.  . Macrocytic anemia   . Lytic lesion of bone on x-ray May/2012.    Negative bone scan.  . Bilateral pneumonia May/2012.  Marland Kitchen Thoracic compression fracture   . Tobacco abuse   . Alcohol withdrawal syndrome 06/10/2011  . Delirium, drug-induced 06/2011    From Ativan  . Abnormal thyroid function test 06/2011.    Normal TSH and slightly low free T4.  Marland Kitchen Hypoglycemia   . Dysphagia   . Syncope and collapse   . Hematuria   . Spinal stenosis   . Cancer     malignant neoplasm of prostate  . Venous thrombosis   . 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 Chronic back pain     Past Surgical History  Procedure Laterality Date  . Total hip arthroplasty      Left hip.  . Tonsillectomy      as child  . Cystoscopy  10/17/2011    Procedure: CYSTOSCOPY;  Surgeon: Anner Crete, MD;  Location: [facility];  Service: Urology;  Laterality: N/A;   Cystoscopy/Orchiectomy/Transrectal Ultrasound Guided Prostate Biopsy  . Orchiectomy  10/17/2011    Procedure: ORCHIECTOMY;  Surgeon: Anner Crete, MD;  Location: [facility];  Service: Urology;  Laterality: Bilateral;  . Prostate biopsy  10/17/2011    Procedure: BIOPSY TRANSRECTAL ULTRASONIC PROSTATE (TUBP);  Surgeon: Anner Crete, MD;  Location: [facility];  Service: Urology;  Laterality: N/A;  . Esophagogastroduodenoscopy N/A 09/25/2012    WRU:EAVWUJ hernia; duodenal bulbar erosions-status post gastric biopsy, Revealed moderate chronic inactive gastritis, negative H.pylori    Current Outpatient Prescriptions  Medication Sig Dispense Refill  . Acetaminophen 500 MG coapsule Take 500 mg by mouth as needed for fever or pain.      Marland Kitchen albuterol (PROVENTIL) (2.5 MG/3ML) 0.083% nebulizer solution Take 2.5 mg by nebulization every 6 (six) hours as needed. For shortness of breath      . pantoprazole (PROTONIX) 40 MG tablet Take 1 tablet (40 mg total) by mouth daily at 12 noon.  30 tablet  3  . penicillin g benzathine (BICILLIN LA) 1200000 UNIT/2ML SUSP injection Inject 4 mLs (2.4 Million Units total) into the muscle once a week.  2 mL  2  . predniSONE (DELTASONE) 20 MG tablet Take 0.5 tablets (10 mg total) by mouth daily with breakfast.  20 tablet  0   No current facility-administered medications for this visit.  Allergies as of 10/17/2012  . (No Known Allergies)    Family History  Problem Relation Age of Onset  . Colon cancer Neg Hx     History   Social History  . Marital Status: Single    Spouse Name: N/A    Number of Children: N/A  . Years of Education: N/A   Social History Main Topics  . Smoking status: Current Every Day Smoker -- 0.50 packs/day for 60 years    Types: Cigarettes  . Smokeless tobacco: Never Used  . Alcohol Use: 24.0 oz/week    40 Shots of liquor per week     Comment: Drinks at least a pint of gin or vodka a day  . Drug Use: No  . Sexual Activity: None   Other Topics  Concern  . None   Social History Narrative   Lives alone. 1 son, age 69.    Review of Systems: Gen: Denies fever, chills, anorexia. Denies fatigue, weakness, weight loss.  CV: Denies chest pain, palpitations, syncope, peripheral edema, and claudication. Resp: +DOE GI: see HPI Derm: Denies rash, itching, dry skin Psych: Denies depression, anxiety, memory loss, confusion. No homicidal or suicidal ideation.  Heme: Denies bruising, bleeding, and enlarged lymph nodes.  Physical Exam: BP 119/75  Pulse 67  Temp(Src) 98.3 F (36.8 C) (Oral)  Ht 5\' 11"  (1.803 m)  Wt 113 lb 9.6 oz (51.529 kg)  BMI 15.85 kg/m2 General:   Alert and oriented. No distress noted. Pleasant and cooperative.  Head:  Normocephalic and atraumatic. Eyes:  Conjuctiva clear without scleral icterus. Mouth:  Oral mucosa pink and moist. Heart:  S1, S2 present Abdomen:  +BS, soft, non-tender and non-distended. No rebound or guarding. No HSM or masses noted. Msk:  Slight kyphosis Extremities:  Without edema. Neurologic:  Alert and  oriented x4;  grossly normal neurologically. Skin:  Intact without significant lesions or rashes. Psych:  Alert and cooperative. Normal mood and affect.   Lab Results  Component Value Date   WBC 5.7 09/25/2012   HGB 10.2* 09/26/2012   HCT 30.0* 09/26/2012   MCV 87.8 09/25/2012   PLT 175 09/25/2012

## 2012-11-06 NOTE — Interval H&P Note (Signed)
History and Physical Interval Note:  11/06/2012 9:13 AM  [name]  has presented today for surgery, with the diagnosis of ANEMIA AND HEME POSITIVE STOOL  The various methods of treatment have been discussed with the patient and family. After consideration of risks, benefits and other options for treatment, the patient has consented to  Procedure(s) with comments: COLONOSCOPY (N/A) - 9:15 as a surgical intervention .  The patient's history has been reviewed, patient examined, no change in status, stable for surgery.  I have reviewed the patient's chart and labs.  Questions were answered to the patient's satisfaction.     No change. H. pylori treatment has been prescribed. Colonoscopy for Hemoccult-positive stool today.The risks, benefits, limitations, alternatives and imponderables have been reviewed with the patient. Questions have been answered. All parties are agreeable.   Jermaine [name]

## 2012-11-08 ENCOUNTER — Encounter: Payer: Self-pay | Admitting: Internal Medicine

## 2012-11-12 ENCOUNTER — Encounter (HOSPITAL_COMMUNITY): Payer: Self-pay | Admitting: Internal Medicine

## 2012-11-19 ENCOUNTER — Encounter (HOSPITAL_COMMUNITY): Payer: Self-pay | Admitting: Emergency Medicine

## 2012-11-19 ENCOUNTER — Emergency Department (HOSPITAL_COMMUNITY)
Admission: EM | Admit: 2012-11-19 | Discharge: 2012-11-19 | Disposition: A | Discharge status: Home / Self Care | Payer: Medicare Other | Attending: Emergency Medicine | Admitting: Emergency Medicine

## 2012-11-19 DIAGNOSIS — Z8781 Personal history of (healed) traumatic fracture: Secondary | ICD-10-CM | POA: Insufficient documentation

## 2012-11-19 DIAGNOSIS — Z8701 Personal history of pneumonia (recurrent): Secondary | ICD-10-CM | POA: Insufficient documentation

## 2012-11-19 DIAGNOSIS — Z862 Personal history of diseases of the blood and blood-forming organs and certain disorders involving the immune mechanism: Secondary | ICD-10-CM | POA: Insufficient documentation

## 2012-11-19 DIAGNOSIS — J4489 Other specified chronic obstructive pulmonary disease: Secondary | ICD-10-CM | POA: Insufficient documentation

## 2012-11-19 DIAGNOSIS — G8929 Other chronic pain: Secondary | ICD-10-CM | POA: Insufficient documentation

## 2012-11-19 DIAGNOSIS — Z79899 Other long term (current) drug therapy: Secondary | ICD-10-CM | POA: Insufficient documentation

## 2012-11-19 DIAGNOSIS — J449 Chronic obstructive pulmonary disease, unspecified: Secondary | ICD-10-CM | POA: Insufficient documentation

## 2012-11-19 DIAGNOSIS — F17200 Nicotine dependence, unspecified, uncomplicated: Secondary | ICD-10-CM | POA: Insufficient documentation

## 2012-11-19 DIAGNOSIS — N611 Abscess of the breast and nipple: Secondary | ICD-10-CM

## 2012-11-19 DIAGNOSIS — Z8739 Personal history of other diseases of the musculoskeletal system and connective tissue: Secondary | ICD-10-CM | POA: Insufficient documentation

## 2012-11-19 DIAGNOSIS — Z86711 Personal history of pulmonary embolism: Secondary | ICD-10-CM | POA: Insufficient documentation

## 2012-11-19 DIAGNOSIS — Z8546 Personal history of malignant neoplasm of prostate: Secondary | ICD-10-CM | POA: Insufficient documentation

## 2012-11-19 DIAGNOSIS — Z792 Long term (current) use of antibiotics: Secondary | ICD-10-CM | POA: Insufficient documentation

## 2012-11-19 DIAGNOSIS — Z86718 Personal history of other venous thrombosis and embolism: Secondary | ICD-10-CM | POA: Insufficient documentation

## 2012-11-19 DIAGNOSIS — N61 Mastitis without abscess: Secondary | ICD-10-CM | POA: Insufficient documentation

## 2012-11-19 DIAGNOSIS — Z8639 Personal history of other endocrine, nutritional and metabolic disease: Secondary | ICD-10-CM | POA: Insufficient documentation

## 2012-11-19 MED ORDER — MUPIROCIN 2 % EX OINT: TOPICAL_OINTMENT | CUTANEOUS | Status: DC

## 2012-11-19 MED ORDER — SULFAMETHOXAZOLE-TMP DS 800-160 MG PO TABS: 1.0000 | ORAL_TABLET | Freq: Two times a day (BID) | ORAL | Status: DC

## 2012-11-19 MED ORDER — SULFAMETHOXAZOLE-TMP DS 800-160 MG PO TABS
1.0000 | ORAL_TABLET | Freq: Once | ORAL | Status: AC
  Administered 2012-11-19: 1 via ORAL
  Filled 2012-11-19: qty 1

## 2012-11-19 MED ORDER — ACETAMINOPHEN 500 MG PO TABS
500.0000 mg | ORAL_TABLET | Freq: Once | ORAL | Status: AC
  Administered 2012-11-19: 500 mg via ORAL
  Filled 2012-11-19: qty 1

## 2012-11-19 NOTE — ED Provider Notes (Signed)
CSN: [id number]     Arrival date & time 11/19/12  0913 History   First MD Initiated Contact with Patient 11/19/12 0930     Chief Complaint  Patient presents with  . Abscess   (Consider location/radiation/quality/duration/timing/severity/associated sxs/prior Treatment) Patient is a 79 y.o. male presenting with abscess. The history is provided by the patient and a relative.  Abscess Abscess location: 1 o'clock position of right areola. Size:  3 cm Abscess quality: draining, itching, painful and redness   Abscess quality: no fluctuance, no induration and no warmth   Red streaking: no   Duration:  1 week Progression:  Improving Pain details:    Quality:  Throbbing   Severity:  Mild   Timing:  Constant   Progression:  Improving Chronicity:  New Context: not diabetes   Relieved by:  Nothing Worsened by:  Nothing tried Ineffective treatments:  None tried Associated symptoms: no fatigue, no fever, no headaches, no nausea and no vomiting   Risk factors: no prior abscess     Patient reports noticing a "blister" next to the right nipple for one week.  States that area was painful and itching.  Noticed that it began draining on the day prior to ed arrival.  He denies fever, chills, swelling, or drainage from the nipple  Past Medical History  Diagnosis Date  . COPD (chronic obstructive pulmonary disease)   . Chronic hip pain     Avascular necrosis, bilateral hips.  . DVT (deep venous thrombosis)   . PE (pulmonary embolism) May/2012.  Marland Kitchen Alcohol abuse   . Elevated PSA     Lost to followup.  . Macrocytic anemia   . Lytic lesion of bone on x-ray May/2012.    Negative bone scan.  . Bilateral pneumonia May/2012.  Marland Kitchen Thoracic compression fracture   . Tobacco abuse   . Alcohol withdrawal syndrome 06/10/2011  . Delirium, drug-induced 06/2011    From Ativan  . Abnormal thyroid function test 06/2011.    Normal TSH and slightly low free T4.  Marland Kitchen Hypoglycemia   . Dysphagia   . Syncope and  collapse   . Hematuria   . Spinal stenosis   . Cancer     malignant neoplasm of prostate  . Venous thrombosis   . 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 Chronic back pain    Past Surgical History  Procedure Laterality Date  . Total hip arthroplasty      Left hip.  . Tonsillectomy      as child  . Cystoscopy  10/17/2011    Procedure: CYSTOSCOPY;  Surgeon: Anner Crete, MD;  Location: [facility];  Service: Urology;  Laterality: N/A;  Cystoscopy/Orchiectomy/Transrectal Ultrasound Guided Prostate Biopsy  . Orchiectomy  10/17/2011    Procedure: ORCHIECTOMY;  Surgeon: Anner Crete, MD;  Location: [facility];  Service: Urology;  Laterality: Bilateral;  . Prostate biopsy  10/17/2011    Procedure: BIOPSY TRANSRECTAL ULTRASONIC PROSTATE (TUBP);  Surgeon: Anner Crete, MD;  Location: [facility];  Service: Urology;  Laterality: N/A;  . Esophagogastroduodenoscopy N/A 09/25/2012    ION:GEXBMW hernia; duodenal bulbar erosions-status post gastric biopsy, Revealed moderate chronic inactive gastritis, negative H.pylori  . Colonoscopy N/A 11/06/2012    Procedure: COLONOSCOPY;  Surgeon: Corbin Ade, MD;  Location: [facility];  Service: Endoscopy;  Laterality: N/A;  9:15   Family History  Problem Relation Age of Onset  . Colon cancer Father    History  Substance Use Topics  .  Smoking status: Current Every Day Smoker -- 0.50 packs/day for 60 years    Types: Cigarettes  . Smokeless tobacco: Never Used  . Alcohol Use: 24.0 oz/week    40 Shots of liquor per week     Comment: hx of ETOH abuse in past, none since September 2014    Review of Systems  Constitutional: Negative for fever, chills and fatigue.  Gastrointestinal: Negative for nausea and vomiting.  Musculoskeletal: Negative for arthralgias and joint swelling.  Skin: Positive for color change.       Abscess   Neurological: Negative for headaches.  Hematological: Negative for adenopathy.  All other systems reviewed and are  negative.    Allergies  Review of patient's allergies indicates no known allergies.  Home Medications   Current Outpatient Rx  Name  Route  Sig  Dispense  Refill  . Acetaminophen 500 MG coapsule   Oral   Take 500 mg by mouth as needed for fever or pain.         Marland Kitchen amoxicillin-clarithromycin-lansoprazole (PREVPAC) combo pack   Oral   Take by mouth 2 (two) times daily. Follow package directions.   1 kit   0   . ipratropium-albuterol (DUONEB) 0.5-2.5 (3) MG/3ML SOLN   Inhalation   Inhale 3 mLs into the lungs every 6 (six) hours as needed.          . pantoprazole (PROTONIX) 40 MG tablet   Oral   Take 1 tablet (40 mg total) by mouth daily at 12 noon.   30 tablet   3   . polyethylene glycol-electrolytes (TRILYTE) 420 G solution   Oral   Take 4,000 mLs by mouth as directed.   4000 mL   0    BP 184/89  Pulse 54  Temp(Src) 97.9 F (36.6 C)  Resp 18  Ht 5\' 11"  (1.803 m)  Wt 115 lb (52.164 kg)  BMI 16.05 kg/m2  SpO2 100% Physical Exam  Nursing note and vitals reviewed. Constitutional: He is oriented to person, place, and time. He appears well-developed and well-nourished. No distress.  HENT:  Head: Normocephalic and atraumatic.  Cardiovascular: Normal rate, regular rhythm and normal heart sounds.   No murmur heard. Pulmonary/Chest: Effort normal and breath sounds normal. No respiratory distress. He exhibits tenderness.  Neurological: He is alert and oriented to person, place, and time. He exhibits normal muscle tone. Coordination normal.  Skin: Skin is warm and dry. There is erythema.  Abscess to the right areola at the 1 o;clock position.  Mild surrounding erythema.  No significant induration.  No lymphangitis.  Nipple appears nml, no discharge    ED Course  Procedures (including critical care time) Labs Review Labs Reviewed - No data to display Imaging Review No results found.  EKG Interpretation   None       MDM    Pt is well appearing,  non-toxic.  Abscess recently began draining.  Moderate purulent material present on patient's shirt.  Possible abscess vs insect bite.  Plan includes warm soaks, Bactrim and Bactroban cream.  Pt and family agree to close f/u with his PMD for recheck or return here if sx's worsen.  Pt appears stable for d/c  Lallie Strahm L. Trisha Mangle, PA-C 11/20/12 2113

## 2012-11-19 NOTE — ED Notes (Signed)
Pt c/o blister to right areola x 1 week, worsening x 1 day

## 2012-11-21 NOTE — ED Provider Notes (Signed)
Medical screening examination/treatment/procedure(s) were conducted as a shared visit with non-physician practitioner(s) and myself.  I personally evaluated the patient during the encounter.  EKG Interpretation   None      Abscess is presently draining. No incision and drainage necessary. Rx Bactrim  Donnetta Hutching, MD 11/21/12 1319

## 2013-01-20 ENCOUNTER — Emergency Department (HOSPITAL_COMMUNITY): Payer: Medicare Other

## 2013-01-20 ENCOUNTER — Encounter (HOSPITAL_COMMUNITY): Payer: Self-pay | Admitting: Emergency Medicine

## 2013-01-20 ENCOUNTER — Emergency Department (HOSPITAL_COMMUNITY)
Admission: EM | Admit: 2013-01-20 | Discharge: 2013-01-20 | Disposition: A | Discharge status: Home / Self Care | Payer: Medicare Other | Attending: Emergency Medicine | Admitting: Emergency Medicine

## 2013-01-20 DIAGNOSIS — Z8701 Personal history of pneumonia (recurrent): Secondary | ICD-10-CM | POA: Insufficient documentation

## 2013-01-20 DIAGNOSIS — R443 Hallucinations, unspecified: Secondary | ICD-10-CM

## 2013-01-20 DIAGNOSIS — Z8546 Personal history of malignant neoplasm of prostate: Secondary | ICD-10-CM | POA: Insufficient documentation

## 2013-01-20 DIAGNOSIS — R42 Dizziness and giddiness: Secondary | ICD-10-CM | POA: Insufficient documentation

## 2013-01-20 DIAGNOSIS — Z86711 Personal history of pulmonary embolism: Secondary | ICD-10-CM | POA: Insufficient documentation

## 2013-01-20 DIAGNOSIS — Z79899 Other long term (current) drug therapy: Secondary | ICD-10-CM | POA: Insufficient documentation

## 2013-01-20 DIAGNOSIS — R4182 Altered mental status, unspecified: Secondary | ICD-10-CM | POA: Insufficient documentation

## 2013-01-20 DIAGNOSIS — Z86718 Personal history of other venous thrombosis and embolism: Secondary | ICD-10-CM | POA: Insufficient documentation

## 2013-01-20 DIAGNOSIS — Z8739 Personal history of other diseases of the musculoskeletal system and connective tissue: Secondary | ICD-10-CM | POA: Insufficient documentation

## 2013-01-20 DIAGNOSIS — Z87448 Personal history of other diseases of urinary system: Secondary | ICD-10-CM | POA: Insufficient documentation

## 2013-01-20 DIAGNOSIS — G8929 Other chronic pain: Secondary | ICD-10-CM | POA: Insufficient documentation

## 2013-01-20 DIAGNOSIS — J209 Acute bronchitis, unspecified: Secondary | ICD-10-CM | POA: Insufficient documentation

## 2013-01-20 DIAGNOSIS — J449 Chronic obstructive pulmonary disease, unspecified: Secondary | ICD-10-CM | POA: Insufficient documentation

## 2013-01-20 DIAGNOSIS — F17200 Nicotine dependence, unspecified, uncomplicated: Secondary | ICD-10-CM | POA: Insufficient documentation

## 2013-01-20 DIAGNOSIS — Z862 Personal history of diseases of the blood and blood-forming organs and certain disorders involving the immune mechanism: Secondary | ICD-10-CM | POA: Insufficient documentation

## 2013-01-20 DIAGNOSIS — Z8781 Personal history of (healed) traumatic fracture: Secondary | ICD-10-CM | POA: Insufficient documentation

## 2013-01-20 DIAGNOSIS — J4489 Other specified chronic obstructive pulmonary disease: Secondary | ICD-10-CM | POA: Insufficient documentation

## 2013-01-20 DIAGNOSIS — J40 Bronchitis, not specified as acute or chronic: Secondary | ICD-10-CM

## 2013-01-20 LAB — COMPREHENSIVE METABOLIC PANEL
ALBUMIN: 3.2 g/dL — AB (ref 3.5–5.2)
ALK PHOS: 79 U/L (ref 39–117)
AST: 15 U/L (ref 0–37)
BUN: 22 mg/dL (ref 6–23)
CALCIUM: 9.5 mg/dL (ref 8.4–10.5)
CO2: 21 mEq/L (ref 19–32)
Chloride: 95 mEq/L — ABNORMAL LOW (ref 96–112)
Creatinine, Ser: 0.96 mg/dL (ref 0.50–1.35)
GFR calc non Af Amer: 77 mL/min — ABNORMAL LOW (ref >90)
GFR, EST AFRICAN AMERICAN: 89 mL/min — AB (ref >90)
Glucose, Bld: 107 mg/dL — ABNORMAL HIGH (ref 70–99)
POTASSIUM: 4 meq/L (ref 3.7–5.3)
SODIUM: 132 meq/L — AB (ref 137–147)
Total Bilirubin: 0.4 mg/dL (ref 0.3–1.2)
Total Protein: 7.3 g/dL (ref 6.0–8.3)

## 2013-01-20 LAB — CBC WITH DIFFERENTIAL/PLATELET
BASOS ABS: 0 10*3/uL (ref 0.0–0.1)
BASOS PCT: 0 % (ref 0–1)
EOS ABS: 0 10*3/uL (ref 0.0–0.7)
EOS PCT: 0 % (ref 0–5)
HCT: 39.8 % (ref 39.0–52.0)
Hemoglobin: 14 g/dL (ref 13.0–17.0)
Lymphocytes Relative: 18 % (ref 12–46)
Lymphs Abs: 1.4 10*3/uL (ref 0.7–4.0)
MCH: 31.4 pg (ref 26.0–34.0)
MCHC: 35.2 g/dL (ref 30.0–36.0)
MCV: 89.2 fL (ref 78.0–100.0)
Monocytes Absolute: 0.8 10*3/uL (ref 0.1–1.0)
Monocytes Relative: 10 % (ref 3–12)
NEUTROS PCT: 72 % (ref 43–77)
Neutro Abs: 5.8 10*3/uL (ref 1.7–7.7)
PLATELETS: 220 10*3/uL (ref 150–400)
RBC: 4.46 MIL/uL (ref 4.22–5.81)
RDW: 15.8 % — AB (ref 11.5–15.5)
WBC: 8 10*3/uL (ref 4.0–10.5)

## 2013-01-20 LAB — URINALYSIS, ROUTINE W REFLEX MICROSCOPIC
GLUCOSE, UA: NEGATIVE mg/dL
Hgb urine dipstick: NEGATIVE
KETONES UR: 15 mg/dL — AB
LEUKOCYTES UA: NEGATIVE
NITRITE: NEGATIVE
PH: 5.5 (ref 5.0–8.0)
Protein, ur: NEGATIVE mg/dL
Specific Gravity, Urine: >1.03 — ABNORMAL HIGH (ref 1.005–1.030)
Urobilinogen, UA: 0.2 mg/dL (ref 0.0–1.0)

## 2013-01-20 LAB — TROPONIN I

## 2013-01-20 MED ORDER — SODIUM CHLORIDE 0.9 % IV BOLUS (SEPSIS)
1000.0000 mL | Freq: Once | INTRAVENOUS | Status: AC
  Administered 2013-01-20: 1000 mL via INTRAVENOUS

## 2013-01-20 MED ORDER — ACETAMINOPHEN 325 MG PO TABS
650.0000 mg | ORAL_TABLET | Freq: Once | ORAL | Status: AC
  Administered 2013-01-20: 650 mg via ORAL
  Filled 2013-01-20: qty 2

## 2013-01-20 MED ORDER — AZITHROMYCIN 250 MG PO TABS: 250.0000 mg | ORAL_TABLET | Freq: Every day | ORAL | Status: DC

## 2013-01-20 NOTE — ED Provider Notes (Addendum)
TIME SEEN: 10:27  CHIEF COMPLAINT: altered mental status  HPI: HPI Comments: Jermaine [name] is a 80 y.o. male with hx of COPD, alcohol abuse who presents to the Emergency Department with family members complaining of altered mental status that started last night. Patient's son and daughter-in-law report patient was hallucinating last night and seeing people that were not there. He does not have a prior history of dementia. Family reports that he has had productive cough for the past 3 days and fever of 102 last night. He was not given any antipyretics. He is afebrile in the emergency department. Patient reports that he is felt slightly lightheaded today. He has not been eating or drinking as well as normal. Denies any headache, numbness or tingling or focal weakness, chest pain or shortness of breath, vomiting or diarrhea, bloody stool or melena.  FANTA,TESFAYE, MD   ROS: See HPI Constitutional: no fever  Eyes: no drainage  ENT: no runny nose   Cardiovascular:  no chest pain  Resp: no SOB  GI: no vomiting GU: no dysuria Integumentary: no rash  Allergy: no hives  Musculoskeletal: no leg swelling  Neurological: no slurred speech ROS otherwise negative  PAST MEDICAL HISTORY/PAST SURGICAL HISTORY:  Past Medical History  Diagnosis Date  . COPD (chronic obstructive pulmonary disease)   . Chronic hip pain     Avascular necrosis, bilateral hips.  . DVT (deep venous thrombosis)   . PE (pulmonary embolism) May/2012.  Marland Kitchen Alcohol abuse   . Elevated PSA     Lost to followup.  . Macrocytic anemia   . Lytic lesion of bone on x-ray May/2012.    Negative bone scan.  . Bilateral pneumonia May/2012.  Marland Kitchen Thoracic compression fracture   . Tobacco abuse   . Alcohol withdrawal syndrome 06/10/2011  . Delirium, drug-induced 06/2011    From Ativan  . Abnormal thyroid function test 06/2011.    Normal TSH and slightly low free T4.  Marland Kitchen Hypoglycemia   . Dysphagia   . Syncope and collapse   . Hematuria    . Spinal stenosis   . Cancer     malignant neoplasm of prostate  . Venous thrombosis   . 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 Chronic back pain     MEDICATIONS:  Prior to Admission medications   Medication Sig Start Date End Date Taking? Authorizing Provider  ipratropium-albuterol (DUONEB) 0.5-2.5 (3) MG/3ML SOLN Inhale 3 mLs into the lungs every 6 (six) hours as needed (shortness of breath).  10/04/12   Historical Provider, MD  mupirocin ointment (BACTROBAN) 2 % Apply to affected area TID x 10 days 11/19/12   Tammy L. Triplett, PA-C  pantoprazole (PROTONIX) 40 MG tablet Take 1 tablet (40 mg total) by mouth daily at 12 noon. 09/26/12   Rosita Fire, MD  sulfamethoxazole-trimethoprim (BACTRIM DS) 800-160 MG per tablet Take 1 tablet by mouth 2 (two) times daily. For 10 days 11/19/12   Tammy L. Triplett, PA-C    ALLERGIES:  No Known Allergies  SOCIAL HISTORY:  History  Substance Use Topics  . Smoking status: Current Every Day Smoker -- 0.50 packs/day for 60 years    Types: Cigarettes  . Smokeless tobacco: Never Used  . Alcohol Use: 24.0 oz/week    40 Shots of liquor per week     Comment: hx of ETOH abuse in past, none since September 2014    FAMILY HISTORY: Family History  Problem Relation Age of Onset  .  Colon cancer Father     EXAM: BP 112/73  Pulse 110  Temp(Src) 97.8 F (36.6 C) (Oral)  Resp 18  Ht 5\' 11"  (1.803 m)  Wt 120 lb (54.432 kg)  BMI 16.74 kg/m2  SpO2 92% CONSTITUTIONAL: Alert and oriented x3 and responds appropriately to questions. Well-appearing; well-nourished; elderly, no apparent distress HEAD: Normocephalic EYES: Conjunctivae clear, PERRL ENT: normal nose; no rhinorrhea; moist mucous membranes; pharynx without lesions noted NECK: Supple, no meningismus, no LAD  CARD: Tachycardic; S1 and S2 appreciated; no murmurs, no clicks, no rubs, no gallops RESP: Normal chest excursion without splinting or tachypnea; breath sounds clear and equal  bilaterally; no wheezes, no rhonchi, no rales,  ABD/GI: Normal bowel sounds; non-distended; soft, non-tender, no rebound, no guarding BACK:  The back appears normal and is non-tender to palpation, there is no CVA tenderness EXT: Normal ROM in all joints; non-tender to palpation; no edema; normal capillary refill; no cyanosis    SKIN: Normal color for age and race; warm NEURO: Moves all extremities equally; sensation intact diffusely; cranial nerves 2-12 intact PSYCH: The patient's mood and manner are appropriate. Grooming and personal hygiene are appropriate.  MEDICAL DECISION MAKING: Patient here with altered mental status that started last night. He is now back to his baseline. No neurologic deficits on exam. He does have history of fever at home, cough and feeling lightheaded. We'll give IV fluids and reassess. We'll check chest x-ray, urine, labs. Do not feel he needs CT imaging of his head at this time he has no focal neurologic deficits.  ED PROGRESS: Patient's labs are reassuring. His urine shows no sign of infection. His chest x-ray shows a by basilar lung opacity that is stable from prior exam but superimposed bronchitis versus interstitial bronchopneumonia cannot be excluded. Given his history of confusion, fevers and cough will treat for community-acquired pneumonia. Given he is back to his baseline, I feel patient can be discharged home on antibiotics with close outpatient followup. Patient and family verbalized understanding and are comfortable with this plan.     2:51 PM  Patient's tachycardia and hypoxia had improved upon my evaluation. His heart rate was in the 90s with oxygen saturation of 95% on room air. He was still normotensive. Family asking for a "prescription for his knee pain". Patient has a history of chronic knee pain. No new injury. No signs of septic arthritis or cellulitis. Patient has been here in the past for delirium related to medications and has a history of alcohol  abuse. I do not feel narcotics are indicated at this time and have instructed him to take Tylenol at home.   EKG Interpretation    Date/Time:  Monday January 20 2013 10:48:20 EST Ventricular Rate:  106 PR Interval:  218 QRS Duration: 92 QT Interval:  364 QTC Calculation: 483 R Axis:   -68 Text Interpretation:  Sinus tachycardia with 1st degree A-V block Left axis deviation Incomplete right bundle branch block Minimal voltage criteria for LVH, may be normal variant Anteroseptal infarct (cited on or before 22-Sep-2012) Abnormal ECG No significant change since last tracing Confirmed by WARD  DO, KRISTEN ([id number]) on 01/20/2013 1:10:53 PM             Delice Bison Ward, DO 01/20/13 [place], DO 01/20/13 [id number]

## 2013-01-20 NOTE — Discharge Instructions (Signed)

## 2013-01-20 NOTE — ED Notes (Signed)
Pt arrived from home where he is cared for by his family.  Pt stated he has been isck for several days w/ a cold, which is why he is not eating well well. The family stated that he has not been eating for the last 3 days, had a fever and has been "illusional since last night", seeing people who are not there and asking to be put in the bed, when he was already in the bed.

## 2013-01-22 LAB — URINE CULTURE
COLONY COUNT: NO GROWTH
Culture: NO GROWTH

## 2013-01-25 LAB — CULTURE, BLOOD (ROUTINE X 2)
CULTURE: NO GROWTH
Culture: NO GROWTH

## 2013-03-11 ENCOUNTER — Ambulatory Visit (INDEPENDENT_AMBULATORY_CARE_PROVIDER_SITE_OTHER): Payer: Medicare Other | Admitting: Gastroenterology

## 2013-03-11 ENCOUNTER — Encounter (INDEPENDENT_AMBULATORY_CARE_PROVIDER_SITE_OTHER): Payer: Self-pay

## 2013-03-11 ENCOUNTER — Encounter: Payer: Self-pay | Admitting: Gastroenterology

## 2013-03-11 VITALS — BP 139/88 | HR 86 | Temp 98.2°F | Ht 71.0 in | Wt 113.0 lb

## 2013-03-11 DIAGNOSIS — K5900 Constipation, unspecified: Secondary | ICD-10-CM | POA: Insufficient documentation

## 2013-03-11 DIAGNOSIS — R1310 Dysphagia, unspecified: Secondary | ICD-10-CM | POA: Insufficient documentation

## 2013-03-11 NOTE — Progress Notes (Signed)
cc'd to pcp 

## 2013-03-11 NOTE — Assessment & Plan Note (Addendum)
New constipation. Colonoscopy up-to-date. TSH normal last September. Trial of Colace 200 mg daily. Can switch to MiraLax if ineffective. Call with ongoing symptoms.

## 2013-03-11 NOTE — Assessment & Plan Note (Signed)
Recent onset vague dysphagia. No evidence of esophageal stricture at time of EGD. Doubt we are dealing with significant esophageal stricture. Question esophageal dysmotility versus oropharyngeal component. Recommend barium pill esophagram initially.

## 2013-03-11 NOTE — Patient Instructions (Signed)
1. Barium esophageal xray as scheduled. 2. Colace 100mg  tablets, take two at breakfast with full glass of water.  3. If constipation does not improve with Colace, you may try Miralax one capful daily. Both of these medications will take several days to start working and have to be taken regularly. 4. Call if you have ongoing problems with constipation.

## 2013-03-11 NOTE — Progress Notes (Addendum)
Primary Care Physician: Rosita Fire, MD  Primary Gastroenterologist:  Garfield Cornea, MD   Chief Complaint  Patient presents with  . Constipation    HPI: Jermaine [name] is a 80 y.o. male here for further evaluation of constipation and difficulty swallowing. His caregiver felt like he needed appointment for further evaluation these new issues. Symptoms started about 2 weeks ago. He has a good appetite. He notes that it takes him a very long time to eat because he has to chew his food very thoroughly for it to go down. He is able to eat meat however. If he put salt and pepper on history, this provokes him to call for any tries to eat it. He also has coughing when he drinks liquids occasionally. Denies any abdominal pain or heartburn. Notes new constipation. Bowel movement every 3 days or so. Bought Colace but has not started it yet. Denies melena or rectal bleeding.   No current outpatient prescriptions on file.   No current facility-administered medications for this visit.    Allergies as of 03/11/2013  . (No Known Allergies)    . Esophagogastroduodenoscopy N/A 09/25/2012    XBJ:YNWGNF hernia; duodenal bulbar erosions-status post gastric biopsy, Revealed moderate chronic inactive gastritis, negative H.pylori  . Colonoscopy N/A 11/06/2012    AOZ:HYQMVHQI colonic polyps removed as described above/Colonic diverticulosis. Tubular adenomas. TCS 11/2015 if overall health permits.   Past Medical History  Diagnosis Date  . COPD (chronic obstructive pulmonary disease)   . Chronic hip pain     Avascular necrosis, bilateral hips.  . DVT (deep venous thrombosis)   . PE (pulmonary embolism) May/2012.  Marland Kitchen Alcohol abuse   . Elevated PSA     Lost to followup.  . Macrocytic anemia   . Lytic lesion of bone on x-ray May/2012.    Negative bone scan.  . Bilateral pneumonia May/2012.  Marland Kitchen Thoracic compression fracture   . Tobacco abuse   . Alcohol withdrawal syndrome 06/10/2011  . Delirium,  drug-induced 06/2011    From Ativan  . Abnormal thyroid function test 06/2011.    Normal TSH and slightly low free T4.  Marland Kitchen Hypoglycemia   . Dysphagia   . Syncope and collapse   . Hematuria   . Spinal stenosis   . Cancer     malignant neoplasm of prostate  . Venous thrombosis   . 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 Chronic back pain    Past Surgical History  Procedure Laterality Date  . Total hip arthroplasty      Left hip.  . Tonsillectomy      as child  . Cystoscopy  10/17/2011    Procedure: CYSTOSCOPY;  Surgeon: Malka So, MD;  Location: [facility];  Service: Urology;  Laterality: N/A;  Cystoscopy/Orchiectomy/Transrectal Ultrasound Guided Prostate Biopsy  . Orchiectomy  10/17/2011    Procedure: ORCHIECTOMY;  Surgeon: Malka So, MD;  Location: [facility];  Service: Urology;  Laterality: Bilateral;  . Prostate biopsy  10/17/2011    Procedure: BIOPSY TRANSRECTAL ULTRASONIC PROSTATE (TUBP);  Surgeon: Malka So, MD;  Location: [facility];  Service: Urology;  Laterality: N/A;  . Esophagogastroduodenoscopy N/A 09/25/2012    ONG:EXBMWU hernia; duodenal bulbar erosions-status post gastric biopsy, Revealed moderate chronic inactive gastritis, negative H.pylori  . Colonoscopy N/A 11/06/2012    XLK:GMWNUUVO colonic polyps removed as described above/Colonic diverticulosis. Tubular adenomas. TCS 11/2015 if overall health permits.   Family History  Problem Relation Age of  Onset  . Colon cancer Father    History   Social History  . Marital Status: Single    Spouse Name: N/A    Number of Children: N/A  . Years of Education: N/A   Social History Main Topics  . Smoking status: Current Every Day Smoker -- 0.50 packs/day for 60 years    Types: Cigarettes  . Smokeless tobacco: Never Used  . Alcohol Use: 24.0 oz/week    40 Shots of liquor per week     Comment: hx of ETOH abuse in past, none since September 2014  . Drug Use: No  . Sexual Activity: None   Other Topics Concern  .  None   Social History Narrative   Lives alone. 1 son, age 16.    ROS:  General: Negative for anorexia, weight loss, fever, chills, fatigue, weakness. ENT: Negative for hoarseness, difficulty swallowing , nasal congestion. CV: Negative for chest pain, angina, palpitations, dyspnea on exertion, peripheral edema.  Respiratory: Negative for dyspnea at rest, dyspnea on exertion, cough, sputum, wheezing.  GI: See history of present illness. GU:  Negative for dysuria, hematuria, urinary incontinence, urinary frequency, nocturnal urination.  Endo: Negative for unusual weight change.    Physical Examination:   BP 139/88  Pulse 86  Temp(Src) 98.2 F (36.8 C) (Oral)  Ht 5\' 11"  (1.803 m)  Wt 113 lb (51.256 kg)  BMI 15.77 kg/m2  General: Well-nourished, well-developed in no acute distress.  Eyes: No icterus. Mouth: Oropharyngeal mucosa moist and pink , no lesions erythema or exudate. Lungs: Clear to auscultation bilaterally.  Heart: Regular rate and rhythm, no murmurs rubs or gallops.  Abdomen: Bowel sounds are normal, nontender, nondistended, no hepatosplenomegaly or masses, no abdominal bruits or hernia , no rebound or guarding.   Extremities: No lower extremity edema. No clubbing or deformities. Neuro: Alert and oriented x 4   Skin: Warm and dry, no jaundice.   Psych: Alert and cooperative, normal mood and affect.  Labs:  Lab Results  Component Value Date   WBC 8.0 01/20/2013   HGB 14.0 01/20/2013   HCT 39.8 01/20/2013   MCV 89.2 01/20/2013   PLT 220 01/20/2013    Imaging Studies: No results found.

## 2013-03-14 ENCOUNTER — Ambulatory Visit (HOSPITAL_COMMUNITY)
Admission: RE | Admit: 2013-03-14 | Discharge: 2013-03-14 | Disposition: A | Discharge status: Home / Self Care | Payer: Medicare Other | Source: Ambulatory Visit | Attending: Gastroenterology | Admitting: Gastroenterology

## 2013-03-14 DIAGNOSIS — R1310 Dysphagia, unspecified: Secondary | ICD-10-CM | POA: Insufficient documentation

## 2013-03-14 DIAGNOSIS — K224 Dyskinesia of esophagus: Secondary | ICD-10-CM | POA: Insufficient documentation

## 2013-03-14 DIAGNOSIS — K225 Diverticulum of esophagus, acquired: Secondary | ICD-10-CM | POA: Insufficient documentation

## 2013-03-14 DIAGNOSIS — E049 Nontoxic goiter, unspecified: Secondary | ICD-10-CM | POA: Insufficient documentation

## 2013-03-24 ENCOUNTER — Telehealth: Payer: Self-pay

## 2013-03-24 NOTE — Telephone Encounter (Signed)
Pt"s family is calling to get results from his xray he had on Friday. Please advise

## 2013-03-25 ENCOUNTER — Other Ambulatory Visit: Payer: Self-pay | Admitting: Internal Medicine

## 2013-03-25 DIAGNOSIS — K222 Esophageal obstruction: Secondary | ICD-10-CM

## 2013-03-25 NOTE — Telephone Encounter (Signed)
Please see result note 

## 2013-03-28 ENCOUNTER — Encounter (HOSPITAL_COMMUNITY): Payer: Self-pay | Admitting: Pharmacy Technician

## 2013-04-01 ENCOUNTER — Ambulatory Visit (HOSPITAL_COMMUNITY)
Admission: RE | Admit: 2013-04-01 | Discharge: 2013-04-01 | Disposition: A | Discharge status: Home / Self Care | Payer: Medicare Other | Source: Ambulatory Visit | Attending: Internal Medicine | Admitting: Internal Medicine

## 2013-04-01 ENCOUNTER — Encounter (HOSPITAL_COMMUNITY): Payer: Self-pay | Admitting: *Deleted

## 2013-04-01 ENCOUNTER — Encounter (HOSPITAL_COMMUNITY)
Admission: RE | Disposition: A | Discharge status: Home / Self Care | Payer: Self-pay | Source: Ambulatory Visit | Attending: Internal Medicine

## 2013-04-01 DIAGNOSIS — R1310 Dysphagia, unspecified: Secondary | ICD-10-CM | POA: Insufficient documentation

## 2013-04-01 DIAGNOSIS — K319 Disease of stomach and duodenum, unspecified: Secondary | ICD-10-CM

## 2013-04-01 DIAGNOSIS — F17200 Nicotine dependence, unspecified, uncomplicated: Secondary | ICD-10-CM | POA: Insufficient documentation

## 2013-04-01 DIAGNOSIS — Q393 Congenital stenosis and stricture of esophagus: Secondary | ICD-10-CM

## 2013-04-01 DIAGNOSIS — D1330 Benign neoplasm of unspecified part of small intestine: Secondary | ICD-10-CM | POA: Insufficient documentation

## 2013-04-01 DIAGNOSIS — K222 Esophageal obstruction: Secondary | ICD-10-CM | POA: Insufficient documentation

## 2013-04-01 DIAGNOSIS — K449 Diaphragmatic hernia without obstruction or gangrene: Secondary | ICD-10-CM

## 2013-04-01 DIAGNOSIS — J4489 Other specified chronic obstructive pulmonary disease: Secondary | ICD-10-CM | POA: Insufficient documentation

## 2013-04-01 DIAGNOSIS — J449 Chronic obstructive pulmonary disease, unspecified: Secondary | ICD-10-CM | POA: Insufficient documentation

## 2013-04-01 DIAGNOSIS — K3189 Other diseases of stomach and duodenum: Secondary | ICD-10-CM

## 2013-04-01 DIAGNOSIS — Q391 Atresia of esophagus with tracheo-esophageal fistula: Secondary | ICD-10-CM

## 2013-04-01 HISTORY — PX: ESOPHAGOGASTRODUODENOSCOPY (EGD) WITH ESOPHAGEAL DILATION: SHX5812

## 2013-04-01 SURGERY — ESOPHAGOGASTRODUODENOSCOPY (EGD) WITH ESOPHAGEAL DILATION
Anesthesia: Moderate Sedation

## 2013-04-01 MED ORDER — PROMETHAZINE HCL 25 MG/ML IJ SOLN
INTRAMUSCULAR | Status: AC
  Filled 2013-04-01: qty 1

## 2013-04-01 MED ORDER — PROMETHAZINE HCL 25 MG/ML IJ SOLN
12.5000 mg | Freq: Once | INTRAMUSCULAR | Status: AC
  Administered 2013-04-01: 12.5 mg via INTRAVENOUS

## 2013-04-01 MED ORDER — MIDAZOLAM HCL 5 MG/5ML IJ SOLN
INTRAMUSCULAR | Status: AC
  Filled 2013-04-01: qty 10

## 2013-04-01 MED ORDER — ONDANSETRON HCL 4 MG/2ML IJ SOLN
INTRAMUSCULAR | Status: DC | PRN
  Administered 2013-04-01: 4 mg via INTRAVENOUS

## 2013-04-01 MED ORDER — MEPERIDINE HCL 100 MG/ML IJ SOLN
INTRAMUSCULAR | Status: DC | PRN
  Administered 2013-04-01: 50 mg via INTRAVENOUS

## 2013-04-01 MED ORDER — LIDOCAINE VISCOUS 2 % MT SOLN
OROMUCOSAL | Status: AC
  Filled 2013-04-01: qty 15

## 2013-04-01 MED ORDER — MIDAZOLAM HCL 5 MG/5ML IJ SOLN
INTRAMUSCULAR | Status: DC | PRN
  Administered 2013-04-01: 2 mg via INTRAVENOUS

## 2013-04-01 MED ORDER — SODIUM CHLORIDE 0.9 % IJ SOLN
INTRAMUSCULAR | Status: AC
  Filled 2013-04-01: qty 10

## 2013-04-01 MED ORDER — STERILE WATER FOR IRRIGATION IR SOLN
Status: DC | PRN
  Administered 2013-04-01: 11:00:00

## 2013-04-01 MED ORDER — SODIUM CHLORIDE 0.9 % IV SOLN: INTRAVENOUS | Status: DC

## 2013-04-01 MED ORDER — MEPERIDINE HCL 100 MG/ML IJ SOLN
INTRAMUSCULAR | Status: DC
Start: 2013-04-01 — End: 2013-04-01
  Filled 2013-04-01: qty 2

## 2013-04-01 MED ORDER — ONDANSETRON HCL 4 MG/2ML IJ SOLN
INTRAMUSCULAR | Status: AC
  Filled 2013-04-01: qty 2

## 2013-04-01 MED ORDER — LIDOCAINE VISCOUS 2 % MT SOLN
OROMUCOSAL | Status: DC | PRN
  Administered 2013-04-01: 4 mL via OROMUCOSAL

## 2013-04-01 NOTE — Op Note (Signed)
[facility] [address] [place], [address]   ENDOSCOPY PROCEDURE REPORT  PATIENT: Jermaine, [name]  MR#: [id number] BIRTHDATE: 07-07-1931 , 74  yrs. old GENDER: Male ENDOSCOPIST: R.  Garfield Cornea, MD FACP FACG REFERRED BY:  Conni Slipper, M.D. PROCEDURE DATE:  04/01/2013 PROCEDURE:     EGD with Venia Minks dilation followed by snare polypectomy duodenal lesion  INDICATIONS:      esophageal dysphagia; barium pill hung up at the GE junction on BPE  INFORMED CONSENT:   The risks, benefits, limitations, alternatives and imponderables have been discussed.  The potential for biopsy, esophogeal dilation, etc. have also been reviewed.  Questions have been answered.  All parties agreeable.  Please see the history and physical in the medical record for more information.  MEDICATIONS:     Versed 2 mg IV and Demerol 50 mg IV. Phenergan 12.5 mg IV and Zofran 4 mg IV. Xylocaine gel orally.  DESCRIPTION OF PROCEDURE:   The EG-[id number] ([id number])  endoscope was introduced through the mouth and advanced to the second portion of the duodenum without difficulty or limitations.  The mucosal surfaces were surveyed very carefully during advancement of the scope and upon withdrawal.  Retroflexion view of the proximal stomach and esophagogastric junction was performed.      FINDINGS: Noncritical. Schatzki's ring; otherwise, normal esophagus. Stomach empty. 2 cm hiatal hernia.  Abnormal gastric mucosa. Patent pylorus.  The patient had a 5 mm adenomatous appearing sessile polypoid lesion in the bulb with a small central area of ulceration. Please see photos.; However, the remainder of the first and second portion appeared normal.  THERAPEUTIC / DIAGNOSTIC MANEUVERS PERFORMED:  A 56 French Maloney dilator was passed to full insertion easily. A look back revealed no apparent complication related to this maneuver. Subsequently, the duodenal lesion was removed with one pass of a hot  snare cautery.   COMPLICATIONS:  None  IMPRESSION:   Schatzki's ring-status post dilation as described above. Hiatal hernia. Duodenal polypoid lesion-status post snare polypectomy.  RECOMMENDATIONS:   Increase omeprazole to 40 mg daily. Abstinence from alcohol recommended. Followup on pathology. Office visit with [place] in 3 months.    _______________________________ R. Garfield Cornea, MD FACP [facility] eSigned:  R. Garfield Cornea, MD FACP [facility] 04/01/2013 11:17 AM     CC:  PATIENT NAME:  Jermaine, [name] MR#: [id number]

## 2013-04-01 NOTE — Discharge Instructions (Signed)
EGD Discharge instructions Please read the instructions outlined below and refer to this sheet in the next few weeks. These discharge instructions provide you with general information on caring for yourself after you leave the hospital. Your doctor may also give you specific instructions. While your treatment has been planned according to the most current medical practices available, unavoidable complications occasionally occur. If you have any problems or questions after discharge, please call your doctor. ACTIVITY  You may resume your regular activity but move at a slower pace for the next 24 hours.   Take frequent rest periods for the next 24 hours.   Walking will help expel (get rid of) the air and reduce the bloated feeling in your abdomen.   No driving for 24 hours (because of the anesthesia (medicine) used during the test).   You may shower.   Do not sign any important legal documents or operate any machinery for 24 hours (because of the anesthesia used during the test).  NUTRITION  Drink plenty of fluids.   You may resume your normal diet.   Begin with a light meal and progress to your normal diet.   Avoid alcoholic beverages for 24 hours or as instructed by your caregiver.  MEDICATIONS  You may resume your normal medications unless your caregiver tells you otherwise.  WHAT YOU CAN EXPECT TODAY  You may experience abdominal discomfort such as a feeling of fullness or gas pains.  FOLLOW-UP  Your doctor will discuss the results of your test with you.  SEEK IMMEDIATE MEDICAL ATTENTION IF ANY OF THE FOLLOWING OCCUR:  Excessive nausea (feeling sick to your stomach) and/or vomiting.   Severe abdominal pain and distention (swelling).   Trouble swallowing.   Temperature over 101 F (37.8 C).   Rectal bleeding or vomiting of blood.    GERD information provided  Stop drinking alcohol  Increase omeprazole to 40 mg daily  Further recommendations to follow pending  review of pathology report  Office visit with [place] in 3 months  Gastroesophageal Reflux Disease, Adult Gastroesophageal reflux disease (GERD) happens when acid from your stomach flows up into the esophagus. When acid comes in contact with the esophagus, the acid causes soreness (inflammation) in the esophagus. Over time, GERD may create small holes (ulcers) in the lining of the esophagus. CAUSES   Increased body weight. This puts pressure on the stomach, making acid rise from the stomach into the esophagus.  Smoking. This increases acid production in the stomach.  Drinking alcohol. This causes decreased pressure in the lower esophageal sphincter (valve or ring of muscle between the esophagus and stomach), allowing acid from the stomach into the esophagus.  Late evening meals and a full stomach. This increases pressure and acid production in the stomach.  A malformed lower esophageal sphincter. Sometimes, no cause is found. SYMPTOMS   Burning pain in the lower part of the mid-chest behind the breastbone and in the mid-stomach area. This may occur twice a week or more often.  Trouble swallowing.  Sore throat.  Dry cough.  Asthma-like symptoms including chest tightness, shortness of breath, or wheezing. DIAGNOSIS  Your caregiver may be able to diagnose GERD based on your symptoms. In some cases, X-rays and other tests may be done to check for complications or to check the condition of your stomach and esophagus. TREATMENT  Your caregiver may recommend over-the-counter or prescription medicines to help decrease acid production. Ask your caregiver before starting or adding any new medicines.  HOME CARE INSTRUCTIONS  Change the factors that you can control. Ask your caregiver for guidance concerning weight loss, quitting smoking, and alcohol consumption.  Avoid foods and drinks that make your symptoms worse, such as:  Caffeine or alcoholic drinks.  Chocolate.  Peppermint or mint  flavorings.  Garlic and onions.  Spicy foods.  Citrus fruits, such as oranges, lemons, or limes.  Tomato-based foods such as sauce, chili, salsa, and pizza.  Fried and fatty foods.  Avoid lying down for the 3 hours prior to your bedtime or prior to taking a nap.  Eat small, frequent meals instead of large meals.  Wear loose-fitting clothing. Do not wear anything tight around your waist that causes pressure on your stomach.  Raise the head of your bed 6 to 8 inches with wood blocks to help you sleep. Extra pillows will not help.  Only take over-the-counter or prescription medicines for pain, discomfort, or fever as directed by your caregiver.  Do not take aspirin, ibuprofen, or other nonsteroidal anti-inflammatory drugs (NSAIDs). SEEK IMMEDIATE MEDICAL CARE IF:   You have pain in your arms, neck, jaw, teeth, or back.  Your pain increases or changes in intensity or duration.  You develop nausea, vomiting, or sweating (diaphoresis).  You develop shortness of breath, or you faint.  Your vomit is green, yellow, black, or looks like coffee grounds or blood.  Your stool is red, bloody, or black. These symptoms could be signs of other problems, such as heart disease, gastric bleeding, or esophageal bleeding. MAKE SURE YOU:   Understand these instructions.  Will watch your condition.  Will get help right away if you are not doing well or get worse. Document Released: 09/28/2004 Document Revised: 03/13/2011 Document Reviewed: 07/08/2010 [facility] Patient Information 2014 [place], [place].

## 2013-04-01 NOTE — Interval H&P Note (Signed)
History and Physical Interval Note:  04/01/2013 10:40 AM  Jermaine [name]  has presented today for surgery, with the diagnosis of ESOPHAGEAL STRICTURE  The various methods of treatment have been discussed with the patient and family. After consideration of risks, benefits and other options for treatment, the patient has consented to  Procedure(s) with comments: ESOPHAGOGASTRODUODENOSCOPY (EGD) WITH ESOPHAGEAL DILATION (N/A) - 11:30-moved to [place] notified pt as a surgical intervention .  The patient's history has been reviewed, patient examined, no change in status, stable for surgery.  I have reviewed the patient's chart and labs.  Questions were answered to the patient's satisfaction.     No change. EGD with dilation as appropriate today.The risks, benefits, limitations, alternatives and imponderables have been reviewed with the patient. Potential for esophageal dilation, biopsy, etc. have also been reviewed.  Questions have been answered. All parties agreeable.  Manus Rudd

## 2013-04-01 NOTE — H&P (View-Only) (Signed)
Primary Care Physician: Rosita Fire, MD  Primary Gastroenterologist:  Garfield Cornea, MD   Chief Complaint  Patient presents with  . Constipation    HPI: Jermaine [name] is a 80 y.o. male here for further evaluation of constipation and difficulty swallowing. His caregiver felt like he needed appointment for further evaluation these new issues. Symptoms started about 2 weeks ago. He has a good appetite. He notes that it takes him a very long time to eat because he has to chew his food very thoroughly for it to go down. He is able to eat meat however. If he put salt and pepper on history, this provokes him to call for any tries to eat it. He also has coughing when he drinks liquids occasionally. Denies any abdominal pain or heartburn. Notes new constipation. Bowel movement every 3 days or so. Bought Colace but has not started it yet. Denies melena or rectal bleeding.   No current outpatient prescriptions on file.   No current facility-administered medications for this visit.    Allergies as of 03/11/2013  . (No Known Allergies)    . Esophagogastroduodenoscopy N/A 09/25/2012    PPJ:[id number] hernia; duodenal bulbar erosions-status post gastric biopsy, Revealed moderate chronic inactive gastritis, negative H.pylori  . Colonoscopy N/A 11/06/2012    TIW:[id number] colonic polyps removed as described above/Colonic diverticulosis. Tubular adenomas. TCS 11/2015 if overall health permits.   Past Medical History  Diagnosis Date  . COPD (chronic obstructive pulmonary disease)   . Chronic hip pain     Avascular necrosis, bilateral hips.  . DVT (deep venous thrombosis)   . PE (pulmonary embolism) May/2012.  Marland Kitchen Alcohol abuse   . Elevated PSA     Lost to followup.  . Macrocytic anemia   . Lytic lesion of bone on x-ray May/2012.    Negative bone scan.  . Bilateral pneumonia May/2012.  Marland Kitchen Thoracic compression fracture   . Tobacco abuse   . Alcohol withdrawal syndrome 06/10/2011  . Delirium,  drug-induced 06/2011    From Ativan  . Abnormal thyroid function test 06/2011.    Normal TSH and slightly low free T4.  Marland Kitchen Hypoglycemia   . Dysphagia   . Syncope and collapse   . Hematuria   . Spinal stenosis   . Cancer     malignant neoplasm of prostate  . Venous thrombosis   . 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 Chronic back pain    Past Surgical History  Procedure Laterality Date  . Total hip arthroplasty      Left hip.  . Tonsillectomy      as child  . Cystoscopy  10/17/2011    Procedure: CYSTOSCOPY;  Surgeon: Malka So, MD;  Location: [facility];  Service: Urology;  Laterality: N/A;  Cystoscopy/Orchiectomy/Transrectal Ultrasound Guided Prostate Biopsy  . Orchiectomy  10/17/2011    Procedure: ORCHIECTOMY;  Surgeon: Malka So, MD;  Location: [facility];  Service: Urology;  Laterality: Bilateral;  . Prostate biopsy  10/17/2011    Procedure: BIOPSY TRANSRECTAL ULTRASONIC PROSTATE (TUBP);  Surgeon: Malka So, MD;  Location: [facility];  Service: Urology;  Laterality: N/A;  . Esophagogastroduodenoscopy N/A 09/25/2012    ASN:[id number] hernia; duodenal bulbar erosions-status post gastric biopsy, Revealed moderate chronic inactive gastritis, negative H.pylori  . Colonoscopy N/A 11/06/2012    BHA:[id number] colonic polyps removed as described above/Colonic diverticulosis. Tubular adenomas. TCS 11/2015 if overall health permits.   Family History  Problem Relation Age of  Onset  . Colon cancer Father    History   Social History  . Marital Status: Single    Spouse Name: N/A    Number of Children: N/A  . Years of Education: N/A   Social History Main Topics  . Smoking status: Current Every Day Smoker -- 0.50 packs/day for 60 years    Types: Cigarettes  . Smokeless tobacco: Never Used  . Alcohol Use: 24.0 oz/week    40 Shots of liquor per week     Comment: hx of ETOH abuse in past, none since September 2014  . Drug Use: No  . Sexual Activity: None   Other Topics Concern  .  None   Social History Narrative   Lives alone. 1 son, age 37.    ROS:  General: Negative for anorexia, weight loss, fever, chills, fatigue, weakness. ENT: Negative for hoarseness, difficulty swallowing , nasal congestion. CV: Negative for chest pain, angina, palpitations, dyspnea on exertion, peripheral edema.  Respiratory: Negative for dyspnea at rest, dyspnea on exertion, cough, sputum, wheezing.  GI: See history of present illness. GU:  Negative for dysuria, hematuria, urinary incontinence, urinary frequency, nocturnal urination.  Endo: Negative for unusual weight change.    Physical Examination:   BP 139/88  Pulse 86  Temp(Src) 98.2 F (36.8 C) (Oral)  Ht 5\' 11"  (1.803 m)  Wt 113 lb (51.256 kg)  BMI 15.77 kg/m2  General: Well-nourished, well-developed in no acute distress.  Eyes: No icterus. Mouth: Oropharyngeal mucosa moist and pink , no lesions erythema or exudate. Lungs: Clear to auscultation bilaterally.  Heart: Regular rate and rhythm, no murmurs rubs or gallops.  Abdomen: Bowel sounds are normal, nontender, nondistended, no hepatosplenomegaly or masses, no abdominal bruits or hernia , no rebound or guarding.   Extremities: No lower extremity edema. No clubbing or deformities. Neuro: Alert and oriented x 4   Skin: Warm and dry, no jaundice.   Psych: Alert and cooperative, normal mood and affect.  Labs:  Lab Results  Component Value Date   WBC 8.0 01/20/2013   HGB 14.0 01/20/2013   HCT 39.8 01/20/2013   MCV 89.2 01/20/2013   PLT 220 01/20/2013    Imaging Studies: No results found.

## 2013-04-02 ENCOUNTER — Encounter (HOSPITAL_COMMUNITY): Payer: Self-pay | Admitting: Internal Medicine

## 2013-04-02 ENCOUNTER — Encounter: Payer: Self-pay | Admitting: Internal Medicine

## 2013-04-12 ENCOUNTER — Emergency Department (HOSPITAL_COMMUNITY)
Admission: EM | Admit: 2013-04-12 | Discharge: 2013-04-13 | Disposition: A | Discharge status: Home / Self Care | Payer: Medicare Other | Attending: Emergency Medicine | Admitting: Emergency Medicine

## 2013-04-12 ENCOUNTER — Encounter (HOSPITAL_COMMUNITY): Payer: Self-pay | Admitting: Emergency Medicine

## 2013-04-12 ENCOUNTER — Emergency Department (HOSPITAL_COMMUNITY): Payer: Medicare Other

## 2013-04-12 DIAGNOSIS — Z8639 Personal history of other endocrine, nutritional and metabolic disease: Secondary | ICD-10-CM | POA: Insufficient documentation

## 2013-04-12 DIAGNOSIS — Z8739 Personal history of other diseases of the musculoskeletal system and connective tissue: Secondary | ICD-10-CM | POA: Insufficient documentation

## 2013-04-12 DIAGNOSIS — Z8701 Personal history of pneumonia (recurrent): Secondary | ICD-10-CM | POA: Insufficient documentation

## 2013-04-12 DIAGNOSIS — F17200 Nicotine dependence, unspecified, uncomplicated: Secondary | ICD-10-CM | POA: Insufficient documentation

## 2013-04-12 DIAGNOSIS — Z86711 Personal history of pulmonary embolism: Secondary | ICD-10-CM | POA: Insufficient documentation

## 2013-04-12 DIAGNOSIS — Z79899 Other long term (current) drug therapy: Secondary | ICD-10-CM | POA: Insufficient documentation

## 2013-04-12 DIAGNOSIS — Z862 Personal history of diseases of the blood and blood-forming organs and certain disorders involving the immune mechanism: Secondary | ICD-10-CM | POA: Insufficient documentation

## 2013-04-12 DIAGNOSIS — Z8546 Personal history of malignant neoplasm of prostate: Secondary | ICD-10-CM | POA: Insufficient documentation

## 2013-04-12 DIAGNOSIS — J4489 Other specified chronic obstructive pulmonary disease: Secondary | ICD-10-CM | POA: Insufficient documentation

## 2013-04-12 DIAGNOSIS — Z8781 Personal history of (healed) traumatic fracture: Secondary | ICD-10-CM | POA: Insufficient documentation

## 2013-04-12 DIAGNOSIS — Z86718 Personal history of other venous thrombosis and embolism: Secondary | ICD-10-CM | POA: Insufficient documentation

## 2013-04-12 DIAGNOSIS — G8929 Other chronic pain: Secondary | ICD-10-CM | POA: Insufficient documentation

## 2013-04-12 DIAGNOSIS — J449 Chronic obstructive pulmonary disease, unspecified: Secondary | ICD-10-CM | POA: Insufficient documentation

## 2013-04-12 DIAGNOSIS — Z8659 Personal history of other mental and behavioral disorders: Secondary | ICD-10-CM | POA: Insufficient documentation

## 2013-04-12 DIAGNOSIS — F1010 Alcohol abuse, uncomplicated: Secondary | ICD-10-CM | POA: Insufficient documentation

## 2013-04-12 DIAGNOSIS — Z87448 Personal history of other diseases of urinary system: Secondary | ICD-10-CM | POA: Insufficient documentation

## 2013-04-12 LAB — BASIC METABOLIC PANEL
BUN: 12 mg/dL (ref 6–23)
CO2: 25 mEq/L (ref 19–32)
Calcium: 8.9 mg/dL (ref 8.4–10.5)
Chloride: 103 mEq/L (ref 96–112)
Creatinine, Ser: 0.72 mg/dL (ref 0.50–1.35)
GFR, EST NON AFRICAN AMERICAN: 86 mL/min — AB (ref >90)
Glucose, Bld: 88 mg/dL (ref 70–99)
Potassium: 4.1 mEq/L (ref 3.7–5.3)
SODIUM: 141 meq/L (ref 137–147)

## 2013-04-12 LAB — CBC WITH DIFFERENTIAL/PLATELET
BASOS PCT: 1 % (ref 0–1)
Basophils Absolute: 0.1 10*3/uL (ref 0.0–0.1)
Eosinophils Absolute: 0.2 10*3/uL (ref 0.0–0.7)
Eosinophils Relative: 3 % (ref 0–5)
HCT: 36.9 % — ABNORMAL LOW (ref 39.0–52.0)
HEMOGLOBIN: 13 g/dL (ref 13.0–17.0)
LYMPHS PCT: 59 % — AB (ref 12–46)
Lymphs Abs: 3.3 10*3/uL (ref 0.7–4.0)
MCH: 32.1 pg (ref 26.0–34.0)
MCHC: 35.2 g/dL (ref 30.0–36.0)
MCV: 91.1 fL (ref 78.0–100.0)
MONOS PCT: 6 % (ref 3–12)
Monocytes Absolute: 0.4 10*3/uL (ref 0.1–1.0)
NEUTROS ABS: 1.7 10*3/uL (ref 1.7–7.7)
NEUTROS PCT: 31 % — AB (ref 43–77)
Platelets: 214 10*3/uL (ref 150–400)
RBC: 4.05 MIL/uL — ABNORMAL LOW (ref 4.22–5.81)
RDW: 16 % — ABNORMAL HIGH (ref 11.5–15.5)
WBC: 5.6 10*3/uL (ref 4.0–10.5)

## 2013-04-12 LAB — ETHANOL: ALCOHOL ETHYL (B): 205 mg/dL — AB (ref 0–11)

## 2013-04-12 LAB — TROPONIN I

## 2013-04-12 NOTE — ED Provider Notes (Signed)
CSN: [id number]     Arrival date & time 04/12/13  1920 History   First MD Initiated Contact with Patient 04/12/13 1925     Chief Complaint  Patient presents with  . Near Syncope    Level 5 caveat.  Pt is intoxicated.  (Consider location/radiation/quality/duration/timing/severity/associated sxs/prior Treatment) HPI Comments: Jermaine [name] is a 80 y.o. Male with a history of COPD, PE, etoh and substance abuse and history of anemia presenting for evaluation of near syncope.  Patient admits to drinking a pint of alcohol prior to arrival and is clearly intoxicated.  The reason for his ed visit is unclear, according to him, he was left home alone today by his cousin in whose home he lives and he was bored, so drank etoh, denies syncope or near syncope.  He states he is unable to walk and uses a wheelchair at home but is able to transfer himself from wheelchair without assistance.  He reports left hip pain which he states is chronic, but is concerned for possible old injury from a prior fall.  He denies chest pain, shortness of breath, headache, dizziness, nausea, vomiting or fevers..       The history is provided by the patient and the EMS personnel. The history is limited by the condition of the patient.    Past Medical History  Diagnosis Date  . COPD (chronic obstructive pulmonary disease)   . Chronic hip pain     Avascular necrosis, bilateral hips.  . DVT (deep venous thrombosis)   . PE (pulmonary embolism) May/2012.  Marland Kitchen Alcohol abuse   . Elevated PSA     Lost to followup.  . Macrocytic anemia   . Lytic lesion of bone on x-ray May/2012.    Negative bone scan.  . Bilateral pneumonia May/2012.  Marland Kitchen Thoracic compression fracture   . Tobacco abuse   . Alcohol withdrawal syndrome 06/10/2011  . Delirium, drug-induced 06/2011    From Ativan  . Abnormal thyroid function test 06/2011.    Normal TSH and slightly low free T4.  Marland Kitchen Hypoglycemia   . Dysphagia   . Syncope and collapse   . Hematuria    . Spinal stenosis   . Cancer     malignant neoplasm of prostate  . Venous thrombosis   . 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 Chronic back pain    Past Surgical History  Procedure Laterality Date  . Total hip arthroplasty      Left hip.  . Tonsillectomy      as child  . Cystoscopy  10/17/2011    Procedure: CYSTOSCOPY;  Surgeon: Malka So, MD;  Location: [facility];  Service: Urology;  Laterality: N/A;  Cystoscopy/Orchiectomy/Transrectal Ultrasound Guided Prostate Biopsy  . Orchiectomy  10/17/2011    Procedure: ORCHIECTOMY;  Surgeon: Malka So, MD;  Location: [facility];  Service: Urology;  Laterality: Bilateral;  . Prostate biopsy  10/17/2011    Procedure: BIOPSY TRANSRECTAL ULTRASONIC PROSTATE (TUBP);  Surgeon: Malka So, MD;  Location: [facility];  Service: Urology;  Laterality: N/A;  . Esophagogastroduodenoscopy N/A 09/25/2012    GHW:EXHBZJ hernia; duodenal bulbar erosions-status post gastric biopsy, Revealed moderate chronic inactive gastritis, negative H.pylori  . Colonoscopy N/A 11/06/2012    IRC:[id number] colonic polyps removed as described above/Colonic diverticulosis. Tubular adenomas. TCS 11/2015 if overall health permits.  . Esophagogastroduodenoscopy (egd) with esophageal dilation N/A 04/01/2013    Procedure: ESOPHAGOGASTRODUODENOSCOPY (EGD) WITH ESOPHAGEAL DILATION;  Surgeon: Daneil Dolin, MD;  [facility];  Service: Endoscopy;  Laterality: N/A;  11:30-moved to [place] notified pt   Family History  Problem Relation Age of Onset  . Colon cancer Father    History  Substance Use Topics  . Smoking status: Current Every Day Smoker -- 0.50 packs/day for 60 years    Types: Cigarettes  . Smokeless tobacco: Never Used  . Alcohol Use: 24.0 oz/week    40 Shots of liquor per week     Comment: hx of ETOH abuse in past, none since September 2014    Review of Systems  Constitutional: Negative for fever.  HENT: Negative for sore throat.   Eyes:  Negative.   Respiratory: Negative for chest tightness and shortness of breath.   Cardiovascular: Negative for chest pain.  Gastrointestinal: Negative for nausea and abdominal pain.  Genitourinary: Negative.   Musculoskeletal: Positive for arthralgias. Negative for joint swelling and neck pain.  Skin: Negative.  Negative for rash and wound.  Neurological: Negative for dizziness, weakness, light-headedness, numbness and headaches.      Allergies  Review of patient's allergies indicates no known allergies.  Home Medications   Current Outpatient Rx  Name  Route  Sig  Dispense  Refill  . omeprazole (PRILOSEC) 20 MG capsule   Oral   Take 20 mg by mouth daily.         . meloxicam (MOBIC) 7.5 MG tablet   Oral   Take 7.5 mg by mouth daily as needed for pain.          BP 152/76  Pulse 81  Temp(Src) 98 F (36.7 C) (Oral)  Resp 20  Ht 5\' 11"  (1.803 m)  Wt 115 lb (52.164 kg)  BMI 16.05 kg/m2  SpO2 100% Physical Exam  Nursing note and vitals reviewed. Constitutional: He appears well-developed. No distress.  Cachectic. Intoxicated.  Slightly unkempt appearance with no obvious gross neglect.   HENT:  Head: Normocephalic and atraumatic.  Eyes: Conjunctivae are normal.  Neck: Normal range of motion.  Cardiovascular: Normal rate, regular rhythm, normal heart sounds and intact distal pulses.   Pulmonary/Chest: Effort normal and breath sounds normal. He has no wheezes. He exhibits no tenderness.  Abdominal: Soft. Bowel sounds are normal. There is no tenderness. There is no rebound.  Musculoskeletal: Normal range of motion.  Neurological: He is alert.  Skin: Skin is warm and dry.  Psychiatric: He has a normal mood and affect. His speech is slurred. He is slowed. He is not agitated and not combative.    ED Course  Procedures (including critical care time) Labs Review Labs Reviewed  TROPONIN I  BASIC METABOLIC PANEL  CBC WITH DIFFERENTIAL  URINALYSIS, ROUTINE W REFLEX  MICROSCOPIC  ETHANOL  URINE RAPID DRUG SCREEN (HOSP PERFORMED)   Imaging Review No results found.   EKG Interpretation None      MDM   Final diagnoses:  None   Patients labs and/or radiological studies were viewed and considered during the medical decision making and disposition process. At re-exam,  the patient was more lucid with less slurred speech and was able to carry on a more meaningful conversation.  Review of chart showed a pattern of intoxication during ED visits which appears no different with today's visit.  Patient did express concern over being left home alone today which she states happens frequently, although he did not express any concerns over safety in his home.  Given his limited mobility and his history of substance abuse, I did  contact Adult Protective [facility] of [facility] who will plan a home visit for this patient to determine his safety at home.  This was discussed with patient and he is agreeable to this visit.  He states when he used to live in [place] he received home visits from a Education officer, museum but has not had this contact since moving to [facility].  This patient was discussed with Dr. Wilson Singer prior to discharge home.  EMS transportation was arranged for him.    Evalee Jefferson, PA-C 04/14/13 [phone]

## 2013-04-12 NOTE — ED Notes (Signed)
Pt admits to drinking today, denies any pain

## 2013-04-13 LAB — URINALYSIS, ROUTINE W REFLEX MICROSCOPIC
Bilirubin Urine: NEGATIVE
Glucose, UA: NEGATIVE mg/dL
HGB URINE DIPSTICK: NEGATIVE
Ketones, ur: NEGATIVE mg/dL
Leukocytes, UA: NEGATIVE
NITRITE: NEGATIVE
Protein, ur: NEGATIVE mg/dL
SPECIFIC GRAVITY, URINE: 1.01 (ref 1.005–1.030)
Urobilinogen, UA: 0.2 mg/dL (ref 0.0–1.0)
pH: 6 (ref 5.0–8.0)

## 2013-04-13 NOTE — Discharge Instructions (Signed)
Alcohol Intoxication  Alcohol intoxication occurs when you drink enough alcohol that it affects your ability to function. It can be mild or very severe. Drinking a lot of alcohol in a short time is called binge drinking. This can be very harmful. Drinking alcohol can also be more dangerous if you are taking medicines or other drugs. Some of the effects caused by alcohol may include:  · Loss of coordination.  · Changes in mood and behavior.  · Unclear thinking.  · Trouble talking (slurred speech).  · Throwing up (vomiting).  · Confusion.  · Slowed breathing.  · Twitching and shaking (seizures).  · Loss of consciousness.  HOME CARE  · Do not drive after drinking alcohol.  · Drink enough water and fluids to keep your pee (urine) clear or pale yellow. Avoid caffeine.  · Only take medicine as told by your doctor.  GET HELP IF:  · You throw up (vomit) many times.  · You do not feel better after a few days.  · You frequently have alcohol intoxication. Your doctor can help decide if you should see a substance use treatment counselor.  GET HELP RIGHT AWAY IF:  · You become shaky when you stop drinking.  · You have twitching and shaking.  · You throw up blood. It may look bright red or like coffee grounds.  · You notice blood in your poop (bowel movements).  · You become lightheaded or pass out (faint).  MAKE SURE YOU:   · Understand these instructions.  · Will watch your condition.  · Will get help right away if you are not doing well or get worse.  Document Released: 06/07/2007 Document Revised: 08/21/2012 Document Reviewed: 05/24/2012  [employer]® Patient Information ©2014 [employer].

## 2013-04-13 NOTE — ED Notes (Signed)
Adult protective services to see pt tomorrow.

## 2013-04-17 NOTE — ED Provider Notes (Signed)
Medical screening examination/treatment/procedure(s) were performed by non-physician practitioner and as supervising physician I was immediately available for consultation/collaboration.   EKG Interpretation   Date/Time:  Saturday April 12 2013 21:29:06 EDT Ventricular Rate:  59 PR Interval:  246 QRS Duration: 78 QT Interval:  472 QTC Calculation: 467 R Axis:   -61 Text Interpretation:  Sinus bradycardia with marked sinus arrhythmia with  1st degree A-V block Left axis deviation Left ventricular hypertrophy with  repolarization abnormality Abnormal ECG When compared with ECG of  20-Jan-2013 10:48, Vent. rate has decreased BY  47 BPM Incomplete right  bundle branch block is no longer Present Criteria for Anteroseptal infarct  are no longer Present ED PHYSICIAN INTERPRETATION AVAILABLE IN [facility] Confirmed by TEST, Record ([id number]) on 04/14/2013 9:56:12 AM       Virgel Manifold, MD 04/17/13 [phone]

## 2013-04-30 ENCOUNTER — Emergency Department (HOSPITAL_COMMUNITY)
Admission: EM | Admit: 2013-04-30 | Discharge: 2013-04-30 | Disposition: A | Discharge status: Home / Self Care | Payer: Medicare Other | Attending: Emergency Medicine | Admitting: Emergency Medicine

## 2013-04-30 ENCOUNTER — Encounter (HOSPITAL_COMMUNITY): Payer: Self-pay | Admitting: Emergency Medicine

## 2013-04-30 DIAGNOSIS — M25569 Pain in unspecified knee: Secondary | ICD-10-CM

## 2013-04-30 DIAGNOSIS — Z8659 Personal history of other mental and behavioral disorders: Secondary | ICD-10-CM | POA: Insufficient documentation

## 2013-04-30 DIAGNOSIS — J4489 Other specified chronic obstructive pulmonary disease: Secondary | ICD-10-CM | POA: Insufficient documentation

## 2013-04-30 DIAGNOSIS — Z8546 Personal history of malignant neoplasm of prostate: Secondary | ICD-10-CM | POA: Insufficient documentation

## 2013-04-30 DIAGNOSIS — Z86711 Personal history of pulmonary embolism: Secondary | ICD-10-CM | POA: Insufficient documentation

## 2013-04-30 DIAGNOSIS — Z8639 Personal history of other endocrine, nutritional and metabolic disease: Secondary | ICD-10-CM | POA: Insufficient documentation

## 2013-04-30 DIAGNOSIS — R4789 Other speech disturbances: Secondary | ICD-10-CM | POA: Insufficient documentation

## 2013-04-30 DIAGNOSIS — Z862 Personal history of diseases of the blood and blood-forming organs and certain disorders involving the immune mechanism: Secondary | ICD-10-CM | POA: Insufficient documentation

## 2013-04-30 DIAGNOSIS — J449 Chronic obstructive pulmonary disease, unspecified: Secondary | ICD-10-CM | POA: Insufficient documentation

## 2013-04-30 DIAGNOSIS — Z86718 Personal history of other venous thrombosis and embolism: Secondary | ICD-10-CM | POA: Insufficient documentation

## 2013-04-30 DIAGNOSIS — Z8701 Personal history of pneumonia (recurrent): Secondary | ICD-10-CM | POA: Insufficient documentation

## 2013-04-30 DIAGNOSIS — Z87448 Personal history of other diseases of urinary system: Secondary | ICD-10-CM | POA: Insufficient documentation

## 2013-04-30 DIAGNOSIS — M79609 Pain in unspecified limb: Secondary | ICD-10-CM | POA: Insufficient documentation

## 2013-04-30 DIAGNOSIS — Z79899 Other long term (current) drug therapy: Secondary | ICD-10-CM | POA: Insufficient documentation

## 2013-04-30 DIAGNOSIS — Z791 Long term (current) use of non-steroidal anti-inflammatories (NSAID): Secondary | ICD-10-CM | POA: Insufficient documentation

## 2013-04-30 DIAGNOSIS — G8929 Other chronic pain: Secondary | ICD-10-CM | POA: Insufficient documentation

## 2013-04-30 DIAGNOSIS — Z8781 Personal history of (healed) traumatic fracture: Secondary | ICD-10-CM | POA: Insufficient documentation

## 2013-04-30 DIAGNOSIS — F17200 Nicotine dependence, unspecified, uncomplicated: Secondary | ICD-10-CM | POA: Insufficient documentation

## 2013-04-30 MED ORDER — HYDROCODONE-ACETAMINOPHEN 5-325 MG PO TABS
1.0000 | ORAL_TABLET | Freq: Once | ORAL | Status: AC
  Administered 2013-04-30: 1 via ORAL
  Filled 2013-04-30: qty 1

## 2013-04-30 MED ORDER — FOLIC ACID 1 MG PO TABS
1.0000 mg | ORAL_TABLET | Freq: Once | ORAL | Status: AC
  Administered 2013-04-30: 1 mg via ORAL
  Filled 2013-04-30: qty 1

## 2013-04-30 MED ORDER — IBUPROFEN 800 MG PO TABS
800.0000 mg | ORAL_TABLET | Freq: Once | ORAL | Status: AC
  Administered 2013-04-30: 800 mg via ORAL
  Filled 2013-04-30: qty 1

## 2013-04-30 MED ORDER — VITAMIN B-1 100 MG PO TABS
100.0000 mg | ORAL_TABLET | Freq: Once | ORAL | Status: AC
  Administered 2013-04-30: 100 mg via ORAL
  Filled 2013-04-30: qty 1

## 2013-04-30 NOTE — Discharge Instructions (Signed)
As discussed, your evaluation today has been largely reassuring.  But, it is important that you monitor your condition carefully, and do not hesitate to return to the ED if you develop new, or concerning changes in your condition.  Otherwise, please follow-up with your physician for appropriate ongoing care.  Cryotherapy Cryotherapy means treatment with cold. Ice or gel packs can be used to reduce both pain and swelling. Ice is the most helpful within the first 24 to 48 hours after an injury or flareup from overusing a muscle or joint. Sprains, strains, spasms, burning pain, shooting pain, and aches can all be eased with ice. Ice can also be used when recovering from surgery. Ice is effective, has very few side effects, and is safe for most people to use. PRECAUTIONS  Ice is not a safe treatment option for people with:  Raynaud's phenomenon. This is a condition affecting small blood vessels in the extremities. Exposure to cold may cause your problems to return.  Cold hypersensitivity. There are many forms of cold hypersensitivity, including:  Cold urticaria. Red, itchy hives appear on the skin when the tissues begin to warm after being iced.  Cold erythema. This is a red, itchy rash caused by exposure to cold.  Cold hemoglobinuria. Red blood cells break down when the tissues begin to warm after being iced. The hemoglobin that carry oxygen are passed into the urine because they cannot combine with blood proteins fast enough.  Numbness or altered sensitivity in the area being iced. If you have any of the following conditions, do not use ice until you have discussed cryotherapy with your caregiver:  Heart conditions, such as arrhythmia, angina, or chronic heart disease.  High blood pressure.  Healing wounds or open skin in the area being iced.  Current infections.  Rheumatoid arthritis.  Poor circulation.  Diabetes. Ice slows the blood flow in the region it is applied. This is  beneficial when trying to stop inflamed tissues from spreading irritating chemicals to surrounding tissues. However, if you expose your skin to cold temperatures for too long or without the proper protection, you can damage your skin or nerves. Watch for signs of skin damage due to cold. HOME CARE INSTRUCTIONS Follow these tips to use ice and cold packs safely.  Place a dry or damp towel between the ice and skin. A damp towel will cool the skin more quickly, so you may need to shorten the time that the ice is used.  For a more rapid response, add gentle compression to the ice.  Ice for no more than 10 to 20 minutes at a time. The bonier the area you are icing, the less time it will take to get the benefits of ice.  Check your skin after 5 minutes to make sure there are no signs of a poor response to cold or skin damage.  Rest 20 minutes or more in between uses.  Once your skin is numb, you can end your treatment. You can test numbness by very lightly touching your skin. The touch should be so light that you do not see the skin dimple from the pressure of your fingertip. When using ice, most people will feel these normal sensations in this order: cold, burning, aching, and numbness.  Do not use ice on someone who cannot communicate their responses to pain, such as small children or people with dementia. HOW TO MAKE AN ICE PACK Ice packs are the most common way to use ice therapy. Other methods include  ice massage, ice baths, and cryo-sprays. Muscle creams that cause a cold, tingly feeling do not offer the same benefits that ice offers and should not be used as a substitute unless recommended by your caregiver. To make an ice pack, do one of the following:  Place crushed ice or a bag of frozen vegetables in a sealable plastic bag. Squeeze out the excess air. Place this bag inside another plastic bag. Slide the bag into a pillowcase or place a damp towel between your skin and the bag.  Mix 3  parts water with 1 part rubbing alcohol. Freeze the mixture in a sealable plastic bag. When you remove the mixture from the freezer, it will be slushy. Squeeze out the excess air. Place this bag inside another plastic bag. Slide the bag into a pillowcase or place a damp towel between your skin and the bag. SEEK MEDICAL CARE IF:  You develop white spots on your skin. This may give the skin a blotchy (mottled) appearance.  Your skin turns blue or pale.  Your skin becomes waxy or hard.  Your swelling gets worse. MAKE SURE YOU:   Understand these instructions.  Will watch your condition.  Will get help right away if you are not doing well or get worse. Document Released: 08/15/2010 Document Revised: 03/13/2011 Document Reviewed: 08/15/2010 [facility] Patient Information 2014 [place], [place].  Arthralgia Your caregiver has diagnosed you as suffering from an arthralgia. Arthralgia means there is pain in a joint. This can come from many reasons including:  Bruising the joint which causes soreness (inflammation) in the joint.  Wear and tear on the joints which occur as we grow older (osteoarthritis).  Overusing the joint.  Various forms of arthritis.  Infections of the joint. Regardless of the cause of pain in your joint, most of these different pains respond to anti-inflammatory drugs and rest. The exception to this is when a joint is infected, and these cases are treated with antibiotics, if it is a bacterial infection. HOME CARE INSTRUCTIONS   Rest the injured area for as long as directed by your caregiver. Then slowly start using the joint as directed by your caregiver and as the pain allows. Crutches as directed may be useful if the ankles, knees or hips are involved. If the knee was splinted or casted, continue use and care as directed. If an stretchy or elastic wrapping bandage has been applied today, it should be removed and re-applied every 3 to 4 hours. It should not be applied  tightly, but firmly enough to keep swelling down. Watch toes and feet for swelling, bluish discoloration, coldness, numbness or excessive pain. If any of these problems (symptoms) occur, remove the ace bandage and re-apply more loosely. If these symptoms persist, contact your caregiver or return to this location.  For the first 24 hours, keep the injured extremity elevated on pillows while lying down.  Apply ice for 15-20 minutes to the sore joint every couple hours while awake for the first half day. Then 03-04 times per day for the first 48 hours. Put the ice in a plastic bag and place a towel between the bag of ice and your skin.  Wear any splinting, casting, elastic bandage applications, or slings as instructed.  Only take over-the-counter or prescription medicines for pain, discomfort, or fever as directed by your caregiver. Do not use aspirin immediately after the injury unless instructed by your physician. Aspirin can cause increased bleeding and bruising of the tissues.  If you were given  crutches, continue to use them as instructed and do not resume weight bearing on the sore joint until instructed. Persistent pain and inability to use the sore joint as directed for more than 2 to 3 days are warning signs indicating that you should see a caregiver for a follow-up visit as soon as possible. Initially, a hairline fracture (break in bone) may not be evident on X-rays. Persistent pain and swelling indicate that further evaluation, non-weight bearing or use of the joint (use of crutches or slings as instructed), or further X-rays are indicated. X-rays may sometimes not show a small fracture until a week or 10 days later. Make a follow-up appointment with your own caregiver or one to whom we have referred you. A radiologist (specialist in reading X-rays) may read your X-rays. Make sure you know how you are to obtain your X-ray results. Do not assume everything is normal if you do not hear from  [place]. SEEK MEDICAL CARE IF: Bruising, swelling, or pain increases. SEEK IMMEDIATE MEDICAL CARE IF:   Your fingers or toes are numb or blue.  The pain is not responding to medications and continues to stay the same or get worse.  The pain in your joint becomes severe.  You develop a fever over 102 F (38.9 C).  It becomes impossible to move or use the joint. MAKE SURE YOU:   Understand these instructions.  Will watch your condition.  Will get help right away if you are not doing well or get worse. Document Released: 12/19/2004 Document Revised: 03/13/2011 Document Reviewed: 08/07/2007 [facility] Patient Information 2014 [place].

## 2013-04-30 NOTE — ED Notes (Signed)
Jermaine [name], left contact number to call if patient is to be discharged. [phone].

## 2013-04-30 NOTE — ED Notes (Signed)
Urinary retention x 1 month.  bil upper leg pain x 2 days.

## 2013-04-30 NOTE — ED Provider Notes (Signed)
CSN: [id number]     Arrival date & time 04/30/13  1538 History   First MD Initiated Contact with Patient 04/30/13 1733     Chief Complaint  Patient presents with  . Urinary Retention  . Leg Pain     (Consider location/radiation/quality/duration/timing/severity/associated sxs/prior Treatment) HPIPatient presents with concerns of ongoing bilateral knee pain.  He denies falls, trauma, pain in other areas.  He states that he always has pain and, typically better with alcohol. Pain is sore, bilateral, diffuse, worse with motion. Patient appears intoxicated, this is a level V caveat.    Past Medical History  Diagnosis Date  . COPD (chronic obstructive pulmonary disease)   . Chronic hip pain     Avascular necrosis, bilateral hips.  . DVT (deep venous thrombosis)   . PE (pulmonary embolism) May/2012.  Marland Kitchen Alcohol abuse   . Elevated PSA     Lost to followup.  . Macrocytic anemia   . Lytic lesion of bone on x-ray May/2012.    Negative bone scan.  . Bilateral pneumonia May/2012.  Marland Kitchen Thoracic compression fracture   . Tobacco abuse   . Alcohol withdrawal syndrome 06/10/2011  . Delirium, drug-induced 06/2011    From Ativan  . Abnormal thyroid function test 06/2011.    Normal TSH and slightly low free T4.  Marland Kitchen Hypoglycemia   . Dysphagia   . Syncope and collapse   . Hematuria   . Spinal stenosis   . Cancer     malignant neoplasm of prostate  . Venous thrombosis   . 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 Chronic back pain    Past Surgical History  Procedure Laterality Date  . Total hip arthroplasty      Left hip.  . Tonsillectomy      as child  . Cystoscopy  10/17/2011    Procedure: CYSTOSCOPY;  Surgeon: Malka So, MD;  Location: [facility];  Service: Urology;  Laterality: N/A;  Cystoscopy/Orchiectomy/Transrectal Ultrasound Guided Prostate Biopsy  . Orchiectomy  10/17/2011    Procedure: ORCHIECTOMY;  Surgeon: Malka So, MD;  Location: [facility];  Service: Urology;  Laterality:  Bilateral;  . Prostate biopsy  10/17/2011    Procedure: BIOPSY TRANSRECTAL ULTRASONIC PROSTATE (TUBP);  Surgeon: Malka So, MD;  Location: [facility];  Service: Urology;  Laterality: N/A;  . Esophagogastroduodenoscopy N/A 09/25/2012    UVO:ZDGUYQ hernia; duodenal bulbar erosions-status post gastric biopsy, Revealed moderate chronic inactive gastritis, negative H.pylori  . Colonoscopy N/A 11/06/2012    IHK:[id number] colonic polyps removed as described above/Colonic diverticulosis. Tubular adenomas. TCS 11/2015 if overall health permits.  . Esophagogastroduodenoscopy (egd) with esophageal dilation N/A 04/01/2013    Procedure: ESOPHAGOGASTRODUODENOSCOPY (EGD) WITH ESOPHAGEAL DILATION;  Surgeon: Daneil Dolin, MD;  Location: [facility];  Service: Endoscopy;  Laterality: N/A;  11:30-moved to [place] notified pt   Family History  Problem Relation Age of Onset  . Colon cancer Father    History  Substance Use Topics  . Smoking status: Current Every Day Smoker -- 0.50 packs/day for 60 years    Types: Cigarettes  . Smokeless tobacco: Never Used  . Alcohol Use: 24.0 oz/week    40 Shots of liquor per week     Comment: hx of ETOH abuse in past, none since September 2014    Review of Systems  Unable to perform ROS: Other      Allergies  Review of patient's allergies indicates no known allergies.  Home  Medications   Prior to Admission medications   Medication Sig Start Date End Date Taking? Authorizing Provider  meloxicam (MOBIC) 7.5 MG tablet Take 7.5 mg by mouth daily as needed for pain.    Historical Provider, MD  omeprazole (PRILOSEC) 20 MG capsule Take 20 mg by mouth daily.    Historical Provider, MD   BP 98/74  Pulse 106  Temp(Src) 97.9 F (36.6 C)  Resp 20  Wt 113 lb (51.256 kg)  SpO2 95% Physical Exam  Nursing note and vitals reviewed. Constitutional: He is oriented to person, place, and time. He appears well-developed. No distress.  HENT:  Head: Normocephalic and  atraumatic.  Eyes: Conjunctivae and EOM are normal.  Cardiovascular: Normal rate and regular rhythm.   Pulmonary/Chest: Effort normal. No stridor. No respiratory distress.  Abdominal: He exhibits no distension. There is no tenderness. There is no rebound.  Musculoskeletal: He exhibits no edema.  There is diffuse atrophy in all muscles. Both knees have bony prominence, with no effusion, no erythema, no laxity. Patient flexes and extends each knee spontaneously, has 5/5 strength in the knees.   Neurological: He is alert and oriented to person, place, and time.  Skin: Skin is warm and dry.  Psychiatric: His speech is slurred. Cognition and memory are impaired.  Patient appears intoxicated    ED Course  Procedures (including critical care time)   Patient wasn't observed to improve his sobriety, on reassessment he is in no distress. Pain has improved. He is speaking clearly, interacting appropriately. MDM   This patient with a history of chronic pain presents with ongoing bilateral knee pain. Patient is hemodynamically stable, neurologically intact.  After.  Monitoring following his initial presentation with some concern for inebriation, the patient was discharged in stable condition to follow up with primary care    Carmin Muskrat, MD 04/30/13 2157

## 2013-05-10 ENCOUNTER — Emergency Department (HOSPITAL_COMMUNITY): Payer: Medicare Other

## 2013-05-10 ENCOUNTER — Emergency Department (HOSPITAL_COMMUNITY)
Admission: EM | Admit: 2013-05-10 | Discharge: 2013-05-10 | Disposition: A | Discharge status: Home / Self Care | Payer: Medicare Other | Attending: Emergency Medicine | Admitting: Emergency Medicine

## 2013-05-10 ENCOUNTER — Encounter (HOSPITAL_COMMUNITY): Payer: Self-pay | Admitting: Emergency Medicine

## 2013-05-10 DIAGNOSIS — Z8546 Personal history of malignant neoplasm of prostate: Secondary | ICD-10-CM | POA: Insufficient documentation

## 2013-05-10 DIAGNOSIS — Z862 Personal history of diseases of the blood and blood-forming organs and certain disorders involving the immune mechanism: Secondary | ICD-10-CM | POA: Insufficient documentation

## 2013-05-10 DIAGNOSIS — Z87448 Personal history of other diseases of urinary system: Secondary | ICD-10-CM | POA: Insufficient documentation

## 2013-05-10 DIAGNOSIS — M79609 Pain in unspecified limb: Secondary | ICD-10-CM | POA: Insufficient documentation

## 2013-05-10 DIAGNOSIS — Z86718 Personal history of other venous thrombosis and embolism: Secondary | ICD-10-CM | POA: Insufficient documentation

## 2013-05-10 DIAGNOSIS — Z8701 Personal history of pneumonia (recurrent): Secondary | ICD-10-CM | POA: Insufficient documentation

## 2013-05-10 DIAGNOSIS — Z791 Long term (current) use of non-steroidal anti-inflammatories (NSAID): Secondary | ICD-10-CM | POA: Insufficient documentation

## 2013-05-10 DIAGNOSIS — J4489 Other specified chronic obstructive pulmonary disease: Secondary | ICD-10-CM | POA: Insufficient documentation

## 2013-05-10 DIAGNOSIS — Z8639 Personal history of other endocrine, nutritional and metabolic disease: Secondary | ICD-10-CM | POA: Insufficient documentation

## 2013-05-10 DIAGNOSIS — Z79899 Other long term (current) drug therapy: Secondary | ICD-10-CM | POA: Insufficient documentation

## 2013-05-10 DIAGNOSIS — Z86711 Personal history of pulmonary embolism: Secondary | ICD-10-CM | POA: Insufficient documentation

## 2013-05-10 DIAGNOSIS — F17200 Nicotine dependence, unspecified, uncomplicated: Secondary | ICD-10-CM | POA: Insufficient documentation

## 2013-05-10 DIAGNOSIS — M79671 Pain in right foot: Secondary | ICD-10-CM

## 2013-05-10 DIAGNOSIS — G8929 Other chronic pain: Secondary | ICD-10-CM | POA: Insufficient documentation

## 2013-05-10 DIAGNOSIS — Z993 Dependence on wheelchair: Secondary | ICD-10-CM | POA: Insufficient documentation

## 2013-05-10 DIAGNOSIS — M7981 Nontraumatic hematoma of soft tissue: Secondary | ICD-10-CM | POA: Insufficient documentation

## 2013-05-10 DIAGNOSIS — Z8781 Personal history of (healed) traumatic fracture: Secondary | ICD-10-CM | POA: Insufficient documentation

## 2013-05-10 DIAGNOSIS — J449 Chronic obstructive pulmonary disease, unspecified: Secondary | ICD-10-CM | POA: Insufficient documentation

## 2013-05-10 NOTE — ED Notes (Signed)
Pt states he was at the park yesterday. States he sat in his wheelchair the entire time. States nothing bit him that he is aware of. Denies injury

## 2013-05-10 NOTE — ED Provider Notes (Signed)
CSN: [id number]     Arrival date & time 05/10/13  1406 History  This chart was scribed for Jermaine Manifold, MD by Jermaine [name], ED Scribe. This patient was seen in room [facility] and the patient's care was started at 2:55 PM.    Chief Complaint  Patient presents with  . Foot Pain   The history is provided by the patient. No language interpreter was used.   HPI Comments: Jermaine [name] is a 80 y.o. male who presents to the Emergency Department complaining of worsening right foot pain that started today. Pt is also complaining of associated swelling and a bruise to the bottom of his right foot. He states that the pain is brought on by bearing weight. Jermaine [name] describes the pain as a "pressure". Pt is wheel chair bound. He denies any recent falls or injury. Denies any fever, chills, nausea, abdominal pain, chest pain, or SOB. He also denies any pertinent medical history.   Pt reports occasional drinking of alcohol since 1950.  Past Medical History  Diagnosis Date  . COPD (chronic obstructive pulmonary disease)   . Chronic hip pain     Avascular necrosis, bilateral hips.  . DVT (deep venous thrombosis)   . PE (pulmonary embolism) May/2012.  Marland Kitchen Alcohol abuse   . Elevated PSA     Lost to followup.  . Macrocytic anemia   . Lytic lesion of bone on x-ray May/2012.    Negative bone scan.  . Bilateral pneumonia May/2012.  Marland Kitchen Thoracic compression fracture   . Tobacco abuse   . Alcohol withdrawal syndrome 06/10/2011  . Delirium, drug-induced 06/2011    From Ativan  . Abnormal thyroid function test 06/2011.    Normal TSH and slightly low free T4.  Marland Kitchen Hypoglycemia   . Dysphagia   . Syncope and collapse   . Hematuria   . Spinal stenosis   . Cancer     malignant neoplasm of prostate  . Venous thrombosis   . 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 Chronic back pain    Past Surgical History  Procedure Laterality Date  . Total hip arthroplasty      Left hip.  . Tonsillectomy      as  child  . Cystoscopy  10/17/2011    Procedure: CYSTOSCOPY;  Surgeon: Malka So, MD;  Location: [facility];  Service: Urology;  Laterality: N/A;  Cystoscopy/Orchiectomy/Transrectal Ultrasound Guided Prostate Biopsy  . Orchiectomy  10/17/2011    Procedure: ORCHIECTOMY;  Surgeon: Malka So, MD;  Location: [facility];  Service: Urology;  Laterality: Bilateral;  . Prostate biopsy  10/17/2011    Procedure: BIOPSY TRANSRECTAL ULTRASONIC PROSTATE (TUBP);  Surgeon: Malka So, MD;  Location: [facility];  Service: Urology;  Laterality: N/A;  . Esophagogastroduodenoscopy N/A 09/25/2012    NFA:OZHYQM hernia; duodenal bulbar erosions-status post gastric biopsy, Revealed moderate chronic inactive gastritis, negative H.pylori  . Colonoscopy N/A 11/06/2012    VHQ:IONGEXBM colonic polyps removed as described above/Colonic diverticulosis. Tubular adenomas. TCS 11/2015 if overall health permits.  . Esophagogastroduodenoscopy (egd) with esophageal dilation N/A 04/01/2013    Procedure: ESOPHAGOGASTRODUODENOSCOPY (EGD) WITH ESOPHAGEAL DILATION;  Surgeon: Daneil Dolin, MD;  Location: [facility];  Service: Endoscopy;  Laterality: N/A;  11:30-moved to [place] notified pt   Family History  Problem Relation Age of Onset  . Colon cancer Father    History  Substance Use Topics  . Smoking status: Current Every Day Smoker -- 0.50  packs/day for 60 years    Types: Cigarettes  . Smokeless tobacco: Never Used  . Alcohol Use: 24.0 oz/week    40 Shots of liquor per week     Comment: hx of ETOH abuse in past, none since September 2014    Review of Systems  Musculoskeletal: Positive for arthralgias (right foot) and joint swelling (right foot).  Skin: Positive for wound (bruise to the bottom of right foot).  All other systems reviewed and are negative.     Allergies  Review of patient's allergies indicates no known allergies.  Home Medications   Prior to Admission medications   Medication Sig Start Date End  Date Taking? Authorizing Provider  meloxicam (MOBIC) 7.5 MG tablet Take 7.5 mg by mouth daily as needed for pain.    Historical Provider, MD  omeprazole (PRILOSEC) 20 MG capsule Take 20 mg by mouth daily.    Historical Provider, MD   BP 124/90  Pulse 104  Temp(Src) 98.1 F (36.7 C) (Oral)  Resp 16  Ht 5\' 11"  (1.803 m)  Wt 113 lb (51.256 kg)  BMI 15.77 kg/m2  SpO2 99%  Physical Exam  Nursing note and vitals reviewed. Constitutional: He appears well-developed and well-nourished. No distress.  HENT:  Head: Normocephalic and atraumatic.  Eyes: Conjunctivae are normal. Right eye exhibits no discharge. Left eye exhibits no discharge.  Neck: Neck supple.  Cardiovascular: Normal rate, regular rhythm and normal heart sounds.  Exam reveals no gallop and no friction rub.   No murmur heard. Easily palpable DP and PT pulses. Foot is warm to touch. Brisk capillary refill in all toes.    Pulmonary/Chest: Effort normal and breath sounds normal. No respiratory distress.  Abdominal: Soft. He exhibits no distension. There is no tenderness.  Musculoskeletal: He exhibits no edema and no tenderness.  Tenderness to palpation over the base of the 4th and 5th metatarsals. Mild foot swelling as compared to the left.    Neurological: He is alert.  Skin: Skin is warm and dry.  Ecchymosis to the instep of his right foot.   Psychiatric: He has a normal mood and affect. His behavior is normal. Thought content normal.    ED Course  Procedures (including critical care time)  DIAGNOSTIC STUDIES: Oxygen Saturation is 99% on RA, normal by my interpretation.    COORDINATION OF CARE: 2:59 PM-Will order X-Ray of his right foot. Pt advised of plan for treatment and pt agrees.  Imaging Review Dg Foot Complete Right  05/10/2013   CLINICAL DATA:  Right foot pain.  EXAM: RIGHT FOOT COMPLETE - 3+ VIEW  COMPARISON:  None.  FINDINGS: Exam demonstrates moderate diffuse decreased bone mineralization. There is a chronic  fracture of the distal diaphysis of the third metatarsal. There is no acute fracture or dislocation. Small vessel atherosclerotic disease is present.  IMPRESSION: No acute fracture.  Diffuse decreased bone mineralization with old third metatarsal fracture.   Electronically Signed   By: Marin Olp M.D.   On: 05/10/2013 15:22   MDM   Final diagnoses:  Foot pain, right    81yM with foot pain. Denies acute trauma. Chronic appearing fx noted on imaging which correlates with area of tenderness.   I personally preformed the services scribed in my presence. The recorded information has been reviewed is accurate. Jermaine Manifold, MD.    Jermaine Manifold, MD 05/15/13 [phone]

## 2013-05-10 NOTE — ED Notes (Signed)
Pt complain of swelling and bruise to bottom of right foot

## 2013-05-10 NOTE — Discharge Instructions (Signed)
Musculoskeletal Pain °Musculoskeletal pain is muscle and boney aches and pains. These pains can occur in any part of the body. Your caregiver may treat you without knowing the cause of the pain. They may treat you if blood or urine tests, X-rays, and other tests were normal.  °CAUSES °There is often not a definite cause or reason for these pains. These pains may be caused by a type of germ (virus). The discomfort may also come from overuse. Overuse includes working out too hard when your body is not fit. Boney aches also come from weather changes. Bone is sensitive to atmospheric pressure changes. °HOME CARE INSTRUCTIONS  °· Ask when your test results will be ready. Make sure you get your test results. °· Only take over-the-counter or prescription medicines for pain, discomfort, or fever as directed by your caregiver. If you were given medications for your condition, do not drive, operate machinery or power tools, or sign legal documents for 24 hours. Do not drink alcohol. Do not take sleeping pills or other medications that may interfere with treatment. °· Continue all activities unless the activities cause more pain. When the pain lessens, slowly resume normal activities. Gradually increase the intensity and duration of the activities or exercise. °· During periods of severe pain, bed rest may be helpful. Lay or sit in any position that is comfortable. °· Putting ice on the injured area. °· Put ice in a bag. °· Place a towel between your skin and the bag. °· Leave the ice on for 15 to 20 minutes, 3 to 4 times a day. °· Follow up with your caregiver for continued problems and no reason can be found for the pain. If the pain becomes worse or does not go away, it may be necessary to repeat tests or do additional testing. Your caregiver may need to look further for a possible cause. °SEEK IMMEDIATE MEDICAL CARE IF: °· You have pain that is getting worse and is not relieved by medications. °· You develop chest pain  that is associated with shortness or breath, sweating, feeling sick to your stomach (nauseous), or throw up (vomit). °· Your pain becomes localized to the abdomen. °· You develop any new symptoms that seem different or that concern you. °MAKE SURE YOU:  °· Understand these instructions. °· Will watch your condition. °· Will get help right away if you are not doing well or get worse. °Document Released: 12/19/2004 Document Revised: 03/13/2011 Document Reviewed: 08/23/2012 °[employer]® Patient Information ©2014 [employer]. ° °

## 2013-05-21 ENCOUNTER — Emergency Department (HOSPITAL_COMMUNITY)
Admission: EM | Admit: 2013-05-21 | Discharge: 2013-05-21 | Disposition: A | Discharge status: Home / Self Care | Payer: Medicare Other | Attending: Emergency Medicine | Admitting: Emergency Medicine

## 2013-05-21 ENCOUNTER — Encounter (HOSPITAL_COMMUNITY): Payer: Self-pay | Admitting: Emergency Medicine

## 2013-05-21 DIAGNOSIS — Z862 Personal history of diseases of the blood and blood-forming organs and certain disorders involving the immune mechanism: Secondary | ICD-10-CM | POA: Insufficient documentation

## 2013-05-21 DIAGNOSIS — Z86711 Personal history of pulmonary embolism: Secondary | ICD-10-CM | POA: Insufficient documentation

## 2013-05-21 DIAGNOSIS — Z79899 Other long term (current) drug therapy: Secondary | ICD-10-CM | POA: Insufficient documentation

## 2013-05-21 DIAGNOSIS — Z792 Long term (current) use of antibiotics: Secondary | ICD-10-CM | POA: Insufficient documentation

## 2013-05-21 DIAGNOSIS — G8929 Other chronic pain: Secondary | ICD-10-CM | POA: Insufficient documentation

## 2013-05-21 DIAGNOSIS — Z8546 Personal history of malignant neoplasm of prostate: Secondary | ICD-10-CM | POA: Insufficient documentation

## 2013-05-21 DIAGNOSIS — J449 Chronic obstructive pulmonary disease, unspecified: Secondary | ICD-10-CM | POA: Insufficient documentation

## 2013-05-21 DIAGNOSIS — R21 Rash and other nonspecific skin eruption: Secondary | ICD-10-CM

## 2013-05-21 DIAGNOSIS — Z86718 Personal history of other venous thrombosis and embolism: Secondary | ICD-10-CM | POA: Insufficient documentation

## 2013-05-21 DIAGNOSIS — F17200 Nicotine dependence, unspecified, uncomplicated: Secondary | ICD-10-CM | POA: Insufficient documentation

## 2013-05-21 DIAGNOSIS — Z8781 Personal history of (healed) traumatic fracture: Secondary | ICD-10-CM | POA: Insufficient documentation

## 2013-05-21 DIAGNOSIS — L2381 Allergic contact dermatitis due to animal (cat) (dog) dander: Secondary | ICD-10-CM | POA: Insufficient documentation

## 2013-05-21 DIAGNOSIS — Z8739 Personal history of other diseases of the musculoskeletal system and connective tissue: Secondary | ICD-10-CM | POA: Insufficient documentation

## 2013-05-21 DIAGNOSIS — Z8701 Personal history of pneumonia (recurrent): Secondary | ICD-10-CM | POA: Insufficient documentation

## 2013-05-21 DIAGNOSIS — Z87448 Personal history of other diseases of urinary system: Secondary | ICD-10-CM | POA: Insufficient documentation

## 2013-05-21 DIAGNOSIS — Z8639 Personal history of other endocrine, nutritional and metabolic disease: Secondary | ICD-10-CM | POA: Insufficient documentation

## 2013-05-21 DIAGNOSIS — Z791 Long term (current) use of non-steroidal anti-inflammatories (NSAID): Secondary | ICD-10-CM | POA: Insufficient documentation

## 2013-05-21 DIAGNOSIS — J4489 Other specified chronic obstructive pulmonary disease: Secondary | ICD-10-CM | POA: Insufficient documentation

## 2013-05-21 LAB — COMPREHENSIVE METABOLIC PANEL
ALT: 5 U/L (ref 0–53)
AST: 16 U/L (ref 0–37)
Albumin: 3.1 g/dL — ABNORMAL LOW (ref 3.5–5.2)
Alkaline Phosphatase: 73 U/L (ref 39–117)
BUN: 12 mg/dL (ref 6–23)
CO2: 21 meq/L (ref 19–32)
Calcium: 8.9 mg/dL (ref 8.4–10.5)
Chloride: 107 mEq/L (ref 96–112)
Creatinine, Ser: 0.73 mg/dL (ref 0.50–1.35)
GFR calc Af Amer: >90 mL/min (ref >90)
GFR, EST NON AFRICAN AMERICAN: 86 mL/min — AB (ref >90)
Glucose, Bld: 88 mg/dL (ref 70–99)
POTASSIUM: 4.2 meq/L (ref 3.7–5.3)
SODIUM: 141 meq/L (ref 137–147)
Total Bilirubin: 0.5 mg/dL (ref 0.3–1.2)
Total Protein: 6.2 g/dL (ref 6.0–8.3)

## 2013-05-21 LAB — CBC WITH DIFFERENTIAL/PLATELET
Basophils Absolute: 0 10*3/uL (ref 0.0–0.1)
Basophils Relative: 1 % (ref 0–1)
Eosinophils Absolute: 0.6 10*3/uL (ref 0.0–0.7)
Eosinophils Relative: 14 % — ABNORMAL HIGH (ref 0–5)
HEMATOCRIT: 33.8 % — AB (ref 39.0–52.0)
Hemoglobin: 11.7 g/dL — ABNORMAL LOW (ref 13.0–17.0)
LYMPHS PCT: 57 % — AB (ref 12–46)
Lymphs Abs: 2.7 10*3/uL (ref 0.7–4.0)
MCH: 31.7 pg (ref 26.0–34.0)
MCHC: 34.6 g/dL (ref 30.0–36.0)
MCV: 91.6 fL (ref 78.0–100.0)
Monocytes Absolute: 0.4 10*3/uL (ref 0.1–1.0)
Monocytes Relative: 7 % (ref 3–12)
NEUTROS ABS: 1 10*3/uL — AB (ref 1.7–7.7)
Neutrophils Relative %: 21 % — ABNORMAL LOW (ref 43–77)
PLATELETS: 219 10*3/uL (ref 150–400)
RBC: 3.69 MIL/uL — ABNORMAL LOW (ref 4.22–5.81)
RDW: 16.6 % — ABNORMAL HIGH (ref 11.5–15.5)
WBC: 4.8 10*3/uL (ref 4.0–10.5)

## 2013-05-21 MED ORDER — CEPHALEXIN 500 MG PO CAPS: 500.0000 mg | ORAL_CAPSULE | Freq: Three times a day (TID) | ORAL | Status: AC

## 2013-05-21 NOTE — ED Provider Notes (Signed)
CSN: [id number]     Arrival date & time 05/21/13  1030 History   First MD Initiated Contact with Patient 05/21/13 1242     Chief Complaint  Patient presents with  . Rash     (Consider location/radiation/quality/duration/timing/severity/associated sxs/prior Treatment) Patient is a 80 y.o. male presenting with rash. The history is provided by the patient (the pt complains of a rash to his left leg and back).  Rash Location: rash to left leg and back. Severity:  Mild Onset quality:  Gradual Timing:  Constant Progression:  Spreading Chronicity:  New Context: animal contact   Associated symptoms: no abdominal pain, no diarrhea, no fatigue and no headaches     Past Medical History  Diagnosis Date  . COPD (chronic obstructive pulmonary disease)   . Chronic hip pain     Avascular necrosis, bilateral hips.  . DVT (deep venous thrombosis)   . PE (pulmonary embolism) May/2012.  Marland Kitchen Alcohol abuse   . Elevated PSA     Lost to followup.  . Macrocytic anemia   . Lytic lesion of bone on x-ray May/2012.    Negative bone scan.  . Bilateral pneumonia May/2012.  Marland Kitchen Thoracic compression fracture   . Tobacco abuse   . Alcohol withdrawal syndrome 06/10/2011  . Delirium, drug-induced 06/2011    From Ativan  . Abnormal thyroid function test 06/2011.    Normal TSH and slightly low free T4.  Marland Kitchen Hypoglycemia   . Dysphagia   . Syncope and collapse   . Hematuria   . Spinal stenosis   . Cancer     malignant neoplasm of prostate  . Venous thrombosis   . 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 Chronic back pain    Past Surgical History  Procedure Laterality Date  . Total hip arthroplasty      Left hip.  . Tonsillectomy      as child  . Cystoscopy  10/17/2011    Procedure: CYSTOSCOPY;  Surgeon: Malka So, MD;  Location: [facility];  Service: Urology;  Laterality: N/A;  Cystoscopy/Orchiectomy/Transrectal Ultrasound Guided Prostate Biopsy  . Orchiectomy  10/17/2011    Procedure: ORCHIECTOMY;   Surgeon: Malka So, MD;  Location: [facility];  Service: Urology;  Laterality: Bilateral;  . Prostate biopsy  10/17/2011    Procedure: BIOPSY TRANSRECTAL ULTRASONIC PROSTATE (TUBP);  Surgeon: Malka So, MD;  Location: [facility];  Service: Urology;  Laterality: N/A;  . Esophagogastroduodenoscopy N/A 09/25/2012    NWG:NFAOZH hernia; duodenal bulbar erosions-status post gastric biopsy, Revealed moderate chronic inactive gastritis, negative H.pylori  . Colonoscopy N/A 11/06/2012    YQM:VHQIONGE colonic polyps removed as described above/Colonic diverticulosis. Tubular adenomas. TCS 11/2015 if overall health permits.  . Esophagogastroduodenoscopy (egd) with esophageal dilation N/A 04/01/2013    Procedure: ESOPHAGOGASTRODUODENOSCOPY (EGD) WITH ESOPHAGEAL DILATION;  Surgeon: Daneil Dolin, MD;  Location: [facility];  Service: Endoscopy;  Laterality: N/A;  11:30-moved to [place] notified pt   Family History  Problem Relation Age of Onset  . Colon cancer Father    History  Substance Use Topics  . Smoking status: Current Every Day Smoker -- 0.50 packs/day for 60 years    Types: Cigarettes  . Smokeless tobacco: Never Used  . Alcohol Use: 24.0 oz/week    40 Shots of liquor per week     Comment: hx of ETOH abuse in past, none since September 2014    Review of Systems  Constitutional: Negative for appetite  change and fatigue.  HENT: Negative for congestion, ear discharge and sinus pressure.   Eyes: Negative for discharge.  Respiratory: Negative for cough.   Cardiovascular: Negative for chest pain.  Gastrointestinal: Negative for abdominal pain and diarrhea.  Genitourinary: Negative for frequency and hematuria.  Musculoskeletal: Negative for back pain.  Skin: Positive for rash.  Neurological: Negative for seizures and headaches.  Psychiatric/Behavioral: Negative for hallucinations.      Allergies  Review of patient's allergies indicates no known allergies.  Home Medications    Prior to Admission medications   Medication Sig Start Date End Date Taking? Authorizing Provider  meloxicam (MOBIC) 7.5 MG tablet Take 7.5 mg by mouth daily as needed for pain.   Yes Historical Provider, MD  omeprazole (PRILOSEC) 20 MG capsule Take 20 mg by mouth daily.   Yes Historical Provider, MD  cephALEXin (KEFLEX) 500 MG capsule Take 1 capsule (500 mg total) by mouth 3 (three) times daily. 05/21/13   Maudry Diego, MD   BP 166/90  Pulse 45  Temp(Src) 97.6 F (36.4 C) (Oral)  Resp 18  Ht 5\' 10"  (1.778 m)  Wt 115 lb (52.164 kg)  BMI 16.50 kg/m2  SpO2 99% Physical Exam  Constitutional: He is oriented to person, place, and time. He appears well-developed.  HENT:  Head: Normocephalic.  Eyes: Conjunctivae and EOM are normal. No scleral icterus.  Neck: Neck supple. No thyromegaly present.  Cardiovascular: Normal rate and regular rhythm.  Exam reveals no gallop and no friction rub.   No murmur heard. Pulmonary/Chest: No stridor. He has no wheezes. He has no rales. He exhibits no tenderness.  Abdominal: He exhibits no distension. There is no tenderness. There is no rebound.  Musculoskeletal: Normal range of motion. He exhibits no edema.  Lymphadenopathy:    He has no cervical adenopathy.  Neurological: He is oriented to person, place, and time. He exhibits normal muscle tone. Coordination normal.  Skin: Rash noted. No erythema.  Vesicular rash to right upper back and left lower leg  Psychiatric: He has a normal mood and affect. His behavior is normal.    ED Course  Procedures (including critical care time) Labs Review Labs Reviewed  CBC WITH DIFFERENTIAL - Abnormal; Notable for the following:    RBC 3.69 (*)    Hemoglobin 11.7 (*)    HCT 33.8 (*)    RDW 16.6 (*)    Neutrophils Relative % 21 (*)    Neutro Abs 1.0 (*)    Lymphocytes Relative 57 (*)    Eosinophils Relative 14 (*)    All other components within normal limits  COMPREHENSIVE METABOLIC PANEL - Abnormal;  Notable for the following:    Albumin 3.1 (*)    GFR calc non Af Amer 86 (*)    All other components within normal limits    Imaging Review No results found.   EKG Interpretation None      MDM   Final diagnoses:  Rash    Rash unknown cause,  Will cover for bacterial infection with close follow up    Maudry Diego, MD 05/21/13 1506

## 2013-05-21 NOTE — Discharge Instructions (Signed)
Follow up with your md next week. °

## 2013-05-21 NOTE — ED Notes (Signed)
Generalized rash since Sunday.  Some areas are blistered.

## 2013-06-17 ENCOUNTER — Ambulatory Visit: Payer: Medicare Other | Admitting: Gastroenterology

## 2013-07-23 ENCOUNTER — Encounter: Payer: Self-pay | Admitting: Gastroenterology

## 2013-07-23 ENCOUNTER — Ambulatory Visit: Payer: Medicare Other | Admitting: Gastroenterology

## 2013-07-23 ENCOUNTER — Telehealth: Payer: Self-pay | Admitting: Gastroenterology

## 2013-07-23 NOTE — Telephone Encounter (Signed)
Pt was a no show

## 2013-07-23 NOTE — Telephone Encounter (Signed)
No show X 2 since 06/2013.

## 2013-12-22 ENCOUNTER — Encounter (HOSPITAL_COMMUNITY): Payer: Self-pay | Admitting: Emergency Medicine

## 2013-12-22 ENCOUNTER — Emergency Department (HOSPITAL_COMMUNITY): Payer: Medicare Other

## 2013-12-22 ENCOUNTER — Emergency Department (HOSPITAL_COMMUNITY)
Admission: EM | Admit: 2013-12-22 | Discharge: 2013-12-22 | Disposition: A | Discharge status: Home / Self Care | Payer: Medicare Other | Attending: Emergency Medicine | Admitting: Emergency Medicine

## 2013-12-22 DIAGNOSIS — Z86711 Personal history of pulmonary embolism: Secondary | ICD-10-CM | POA: Insufficient documentation

## 2013-12-22 DIAGNOSIS — Z8781 Personal history of (healed) traumatic fracture: Secondary | ICD-10-CM | POA: Insufficient documentation

## 2013-12-22 DIAGNOSIS — Z720 Tobacco use: Secondary | ICD-10-CM | POA: Insufficient documentation

## 2013-12-22 DIAGNOSIS — Y9389 Activity, other specified: Secondary | ICD-10-CM | POA: Insufficient documentation

## 2013-12-22 DIAGNOSIS — Z8701 Personal history of pneumonia (recurrent): Secondary | ICD-10-CM | POA: Insufficient documentation

## 2013-12-22 DIAGNOSIS — Y998 Other external cause status: Secondary | ICD-10-CM | POA: Insufficient documentation

## 2013-12-22 DIAGNOSIS — Z862 Personal history of diseases of the blood and blood-forming organs and certain disorders involving the immune mechanism: Secondary | ICD-10-CM | POA: Insufficient documentation

## 2013-12-22 DIAGNOSIS — W19XXXA Unspecified fall, initial encounter: Secondary | ICD-10-CM

## 2013-12-22 DIAGNOSIS — Z86718 Personal history of other venous thrombosis and embolism: Secondary | ICD-10-CM | POA: Insufficient documentation

## 2013-12-22 DIAGNOSIS — J449 Chronic obstructive pulmonary disease, unspecified: Secondary | ICD-10-CM | POA: Diagnosis not present

## 2013-12-22 DIAGNOSIS — Z792 Long term (current) use of antibiotics: Secondary | ICD-10-CM | POA: Insufficient documentation

## 2013-12-22 DIAGNOSIS — Z8546 Personal history of malignant neoplasm of prostate: Secondary | ICD-10-CM | POA: Insufficient documentation

## 2013-12-22 DIAGNOSIS — Y929 Unspecified place or not applicable: Secondary | ICD-10-CM | POA: Diagnosis not present

## 2013-12-22 DIAGNOSIS — G8929 Other chronic pain: Secondary | ICD-10-CM | POA: Insufficient documentation

## 2013-12-22 DIAGNOSIS — Z79899 Other long term (current) drug therapy: Secondary | ICD-10-CM | POA: Insufficient documentation

## 2013-12-22 DIAGNOSIS — S8002XA Contusion of left knee, initial encounter: Secondary | ICD-10-CM | POA: Insufficient documentation

## 2013-12-22 DIAGNOSIS — S8992XA Unspecified injury of left lower leg, initial encounter: Secondary | ICD-10-CM | POA: Diagnosis present

## 2013-12-22 DIAGNOSIS — T148XXA Other injury of unspecified body region, initial encounter: Secondary | ICD-10-CM

## 2013-12-22 MED ORDER — TRAMADOL HCL 50 MG PO TABS
ORAL_TABLET | ORAL | Status: AC
  Filled 2013-12-22: qty 2

## 2013-12-22 MED ORDER — TRAMADOL HCL 50 MG PO TABS: 50.0000 mg | ORAL_TABLET | Freq: Four times a day (QID) | ORAL | Status: AC | PRN

## 2013-12-22 NOTE — ED Provider Notes (Signed)
CSN: [id number]     Arrival date & time 12/22/13  1652 History   First MD Initiated Contact with Patient 12/22/13 1746     Chief Complaint  Patient presents with  . Knee Pain     (Consider location/radiation/quality/duration/timing/severity/associated sxs/prior Treatment) HPI Comments: Patient presents to the ER with complaints of left knee pain after a fall. Patient reports that he was trying to transition from his wheelchair to his bed last night and forgot to lock the wheelchair. He fell from a seated position to the ground injuring his left knee. He did not hit his head. There was no loss of consciousness. Patient denies neck pain, back pain, chest pain, abdominal pain. He did not injure his arms. He has been noticing increasing pain in the left knee today and has not been able to bear weight.  Patient is a 80 y.o. male presenting with knee pain.  Knee Pain   Past Medical History  Diagnosis Date  . COPD (chronic obstructive pulmonary disease)   . Chronic hip pain     Avascular necrosis, bilateral hips.  . DVT (deep venous thrombosis)   . PE (pulmonary embolism) May/2012.  Marland Kitchen Alcohol abuse   . Elevated PSA     Lost to followup.  . Macrocytic anemia   . Lytic lesion of bone on x-ray May/2012.    Negative bone scan.  . Bilateral pneumonia May/2012.  Marland Kitchen Thoracic compression fracture   . Tobacco abuse   . Alcohol withdrawal syndrome 06/10/2011  . Delirium, drug-induced 06/2011    From Ativan  . Abnormal thyroid function test 06/2011.    Normal TSH and slightly low free T4.  Marland Kitchen Hypoglycemia   . Dysphagia   . Syncope and collapse   . Hematuria   . Spinal stenosis   . Cancer     malignant neoplasm of prostate  . Venous thrombosis   . 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 Chronic back pain    Past Surgical History  Procedure Laterality Date  . Total hip arthroplasty      Left hip.  . Tonsillectomy      as child  . Cystoscopy  10/17/2011    Procedure: CYSTOSCOPY;   Surgeon: Malka So, MD;  Location: [facility];  Service: Urology;  Laterality: N/A;  Cystoscopy/Orchiectomy/Transrectal Ultrasound Guided Prostate Biopsy  . Orchiectomy  10/17/2011    Procedure: ORCHIECTOMY;  Surgeon: Malka So, MD;  Location: [facility];  Service: Urology;  Laterality: Bilateral;  . Prostate biopsy  10/17/2011    Procedure: BIOPSY TRANSRECTAL ULTRASONIC PROSTATE (TUBP);  Surgeon: Malka So, MD;  Location: [facility];  Service: Urology;  Laterality: N/A;  . Esophagogastroduodenoscopy N/A 09/25/2012    YOV:ZCHYIF hernia; duodenal bulbar erosions-status post gastric biopsy, Revealed moderate chronic inactive gastritis, negative H.pylori  . Colonoscopy N/A 11/06/2012    OYD:XAJOINOM colonic polyps removed as described above/Colonic diverticulosis. Tubular adenomas. TCS 11/2015 if overall health permits.  . Esophagogastroduodenoscopy (egd) with esophageal dilation N/A 04/01/2013    Dr. Gareth Morgan ring- s/p dilation,m hiatla hernia, duodenal polypoid lesion, bx= benign brunner's glands   Family History  Problem Relation Age of Onset  . Colon cancer Father    History  Substance Use Topics  . Smoking status: Current Every Day Smoker -- 0.50 packs/day for 60 years    Types: Cigarettes  . Smokeless tobacco: Never Used  . Alcohol Use: 24.0 oz/week    40 Shots of liquor per week  Comment: hx of ETOH abuse in past, none since September 2014    Review of Systems  Musculoskeletal: Positive for arthralgias.  All other systems reviewed and are negative.     Allergies  Review of patient's allergies indicates no known allergies.  Home Medications   Prior to Admission medications   Medication Sig Start Date End Date Taking? Authorizing Provider  cephALEXin (KEFLEX) 500 MG capsule Take 1 capsule (500 mg total) by mouth 3 (three) times daily. Patient not taking: Reported on 12/22/2013 05/21/13   Maudry Diego, MD  meloxicam (MOBIC) 7.5 MG tablet Take 7.5 mg by mouth daily as  needed for pain.    Historical Provider, MD  omeprazole (PRILOSEC) 20 MG capsule Take 20 mg by mouth daily.    Historical Provider, MD  PROAIR HFA 108 (90 BASE) MCG/ACT inhaler Inhale 2 puffs into the lungs every 6 (six) hours as needed for wheezing or shortness of breath.  09/16/13   Historical Provider, MD  traMADol (ULTRAM) 50 MG tablet Take 1 tablet (50 mg total) by mouth every 6 (six) hours as needed. 12/22/13   Orpah Greek, MD  traMADol (ULTRAM) 50 MG tablet Take 1 tablet (50 mg total) by mouth every 6 (six) hours as needed. 12/22/13   Orpah Greek, MD   BP 142/84 mmHg  Pulse 96  Temp(Src) 99.4 F (37.4 C) (Oral)  Resp 18  Ht 5\' 11"  (1.803 m)  Wt 113 lb (51.256 kg)  BMI 15.77 kg/m2  SpO2 96% Physical Exam  Constitutional: He is oriented to person, place, and time. He appears well-developed and well-nourished. No distress.  HENT:  Head: Normocephalic and atraumatic.  Right Ear: Hearing normal.  Left Ear: Hearing normal.  Nose: Nose normal.  Mouth/Throat: Oropharynx is clear and moist and mucous membranes are normal.  Eyes: Conjunctivae and EOM are normal. Pupils are equal, round, and reactive to light.  Neck: Normal range of motion. Neck supple.  Cardiovascular: Regular rhythm, S1 normal and S2 normal.  Exam reveals no gallop and no friction rub.   No murmur heard. Pulmonary/Chest: Effort normal and breath sounds normal. No respiratory distress. He exhibits no tenderness.  Abdominal: Soft. Normal appearance and bowel sounds are normal. There is no hepatosplenomegaly. There is no tenderness. There is no rebound, no guarding, no tenderness at McBurney's point and negative Murphy's sign. No hernia.  Musculoskeletal: Normal range of motion.       Left knee: He exhibits normal range of motion, no swelling, no effusion, no ecchymosis, no deformity, no laceration, no erythema and normal alignment. Tenderness found.  Neurological: He is alert and oriented to person,  place, and time. He has normal strength. No cranial nerve deficit or sensory deficit. Coordination normal. GCS eye subscore is 4. GCS verbal subscore is 5. GCS motor subscore is 6.  Skin: Skin is warm, dry and intact. No rash noted. No cyanosis.  Psychiatric: He has a normal mood and affect. His speech is normal and behavior is normal. Thought content normal.  Nursing note and vitals reviewed.   ED Course  Procedures (including critical care time) Labs Review Labs Reviewed - No data to display  Imaging Review Dg Hip Complete Left  12/22/2013   CLINICAL DATA:  Left hip pain with limited weight-bearing since falling yesterday.  EXAM: LEFT HIP - COMPLETE 2+ VIEW  COMPARISON:  Radiographs 04/12/2013.  FINDINGS: There are stable postsurgical changes status post dynamic screw and plate fixation of the proximal left femur. Mild lucency  surrounding the dynamic screw is stable. There is stable posttraumatic deformity of the proximal left femur with flattening of the left femoral head, likely due to chronic osteonecrosis. The bones are diffusely demineralized. No acute fracture or dislocation identified. Mild irregularity of the left iliopectineal line is unchanged. There are vascular calcifications throughout the pelvis.  IMPRESSION: Stable posttraumatic and postsurgical changes as described. No evidence of acute fracture or dislocation.  If the patient has persistent hip pain or inability to bear weight, follow up imaging may be warranted as hip/pelvic fractures can be radiographically occult in the elderly.   Electronically Signed   By: Camie Patience M.D.   On: 12/22/2013 18:44   Dg Knee Complete 4 Views Left  12/22/2013   CLINICAL DATA:  Initial evaluation for knee pain.  Acute.  EXAM: LEFT KNEE - COMPLETE 4+ VIEW  COMPARISON:  None.  FINDINGS: No acute fracture or dislocation. There is severe tricompartmental degenerative osteoarthrosis as evidenced by joint space narrowing, subchondral sclerosis, and  osteophytosis. Diffuse osteopenia present. No acute soft tissue abnormality. Scattered vascular calcifications noted within the upper calf.  IMPRESSION: 1. No acute fracture dislocation. Please note that severe osteopenia somewhat limits evaluation for subtle nondisplaced fractures. 2. Advanced tricompartmental degenerative osteoarthrosis.   Electronically Signed   By: Jeannine Boga M.D.   On: 12/22/2013 17:35     EKG Interpretation None      MDM   Final diagnoses:  Fall  Contusion    Patient had a fall from his wheelchair yesterday injuring the left knee. Patient indicates that the pain is in the front and side of the knee, but there is no swelling or deformity. No effusion. No overlying erythema, no abrasion or laceration. X-ray was negative. Patient was sent back to radiology for next day to ensure that this was not referred pain. He does have a previous fracture repair that shows no evidence of acute injury. Patient reassured, will be treated with analgesia. Follow-up with primary doctor.    Orpah Greek, MD 12/22/13 2036

## 2013-12-22 NOTE — ED Notes (Signed)
Pt reports fell last night on left knee trying to get into the bed. Pt denies loc,hitting head. Pt reports not able to bear weight on left leg since last night. No obvious deformity noted.

## 2013-12-22 NOTE — ED Notes (Signed)
Called Jermaine [name] per pt for ride home. She stated she would come get pt.

## 2013-12-22 NOTE — Discharge Instructions (Signed)
Contusion °A contusion is a deep bruise. Contusions are the result of an injury that caused bleeding under the skin. The contusion may turn blue, purple, or yellow. Minor injuries will give you a painless contusion, but more severe contusions may stay painful and swollen for a few weeks.  °CAUSES  °A contusion is usually caused by a blow, trauma, or direct force to an area of the body. °SYMPTOMS  °· Swelling and redness of the injured area. °· Bruising of the injured area. °· Tenderness and soreness of the injured area. °· Pain. °DIAGNOSIS  °The diagnosis can be made by taking a history and physical exam. An X-ray, CT scan, or MRI may be needed to determine if there were any associated injuries, such as fractures. °TREATMENT  °Specific treatment will depend on what area of the body was injured. In general, the best treatment for a contusion is resting, icing, elevating, and applying cold compresses to the injured area. Over-the-counter medicines may also be recommended for pain control. Ask your caregiver what the best treatment is for your contusion. °HOME CARE INSTRUCTIONS  °· Put ice on the injured area. °¨ Put ice in a plastic bag. °¨ Place a towel between your skin and the bag. °¨ Leave the ice on for 15-20 minutes, 3-4 times a day, or as directed by your health care provider. °· Only take over-the-counter or prescription medicines for pain, discomfort, or fever as directed by your caregiver. Your caregiver may recommend avoiding anti-inflammatory medicines (aspirin, ibuprofen, and naproxen) for 48 hours because these medicines may increase bruising. °· Rest the injured area. °· If possible, elevate the injured area to reduce swelling. °SEEK IMMEDIATE MEDICAL CARE IF:  °· You have increased bruising or swelling. °· You have pain that is getting worse. °· Your swelling or pain is not relieved with medicines. °MAKE SURE YOU:  °· Understand these instructions. °· Will watch your condition. °· Will get help right  away if you are not doing well or get worse. °Document Released: 09/28/2004 Document Revised: 12/24/2012 Document Reviewed: 10/24/2010 °ExitCare® Patient Information ©2015 [employer]. This information is not intended to replace advice given to you by your health care provider. Make sure you discuss any questions you have with your health care provider. ° °

## 2014-04-20 ENCOUNTER — Emergency Department (HOSPITAL_COMMUNITY)
Admission: EM | Admit: 2014-04-20 | Discharge: 2014-04-20 | Disposition: A | Discharge status: Home / Self Care | Payer: Medicare Other | Attending: Emergency Medicine | Admitting: Emergency Medicine

## 2014-04-20 ENCOUNTER — Encounter (HOSPITAL_COMMUNITY): Payer: Self-pay | Admitting: Emergency Medicine

## 2014-04-20 ENCOUNTER — Emergency Department (HOSPITAL_COMMUNITY): Payer: Medicare Other

## 2014-04-20 DIAGNOSIS — Z86718 Personal history of other venous thrombosis and embolism: Secondary | ICD-10-CM | POA: Insufficient documentation

## 2014-04-20 DIAGNOSIS — Z86711 Personal history of pulmonary embolism: Secondary | ICD-10-CM | POA: Insufficient documentation

## 2014-04-20 DIAGNOSIS — Z792 Long term (current) use of antibiotics: Secondary | ICD-10-CM | POA: Insufficient documentation

## 2014-04-20 DIAGNOSIS — Z8639 Personal history of other endocrine, nutritional and metabolic disease: Secondary | ICD-10-CM | POA: Diagnosis not present

## 2014-04-20 DIAGNOSIS — G8929 Other chronic pain: Secondary | ICD-10-CM | POA: Insufficient documentation

## 2014-04-20 DIAGNOSIS — M25552 Pain in left hip: Secondary | ICD-10-CM | POA: Diagnosis not present

## 2014-04-20 DIAGNOSIS — Z8739 Personal history of other diseases of the musculoskeletal system and connective tissue: Secondary | ICD-10-CM | POA: Diagnosis not present

## 2014-04-20 DIAGNOSIS — Z8701 Personal history of pneumonia (recurrent): Secondary | ICD-10-CM | POA: Insufficient documentation

## 2014-04-20 DIAGNOSIS — Z8546 Personal history of malignant neoplasm of prostate: Secondary | ICD-10-CM | POA: Diagnosis not present

## 2014-04-20 DIAGNOSIS — Y998 Other external cause status: Secondary | ICD-10-CM | POA: Insufficient documentation

## 2014-04-20 DIAGNOSIS — Y9389 Activity, other specified: Secondary | ICD-10-CM | POA: Diagnosis not present

## 2014-04-20 DIAGNOSIS — S79912A Unspecified injury of left hip, initial encounter: Secondary | ICD-10-CM | POA: Diagnosis not present

## 2014-04-20 DIAGNOSIS — Y9289 Other specified places as the place of occurrence of the external cause: Secondary | ICD-10-CM | POA: Insufficient documentation

## 2014-04-20 DIAGNOSIS — J449 Chronic obstructive pulmonary disease, unspecified: Secondary | ICD-10-CM | POA: Insufficient documentation

## 2014-04-20 DIAGNOSIS — M1612 Unilateral primary osteoarthritis, left hip: Secondary | ICD-10-CM | POA: Diagnosis not present

## 2014-04-20 DIAGNOSIS — W1839XA Other fall on same level, initial encounter: Secondary | ICD-10-CM | POA: Diagnosis not present

## 2014-04-20 DIAGNOSIS — Z720 Tobacco use: Secondary | ICD-10-CM | POA: Diagnosis not present

## 2014-04-20 DIAGNOSIS — Z862 Personal history of diseases of the blood and blood-forming organs and certain disorders involving the immune mechanism: Secondary | ICD-10-CM | POA: Insufficient documentation

## 2014-04-20 DIAGNOSIS — M25559 Pain in unspecified hip: Secondary | ICD-10-CM

## 2014-04-20 MED ORDER — OXYCODONE-ACETAMINOPHEN 5-325 MG PO TABS
2.0000 | ORAL_TABLET | Freq: Once | ORAL | Status: AC
  Administered 2014-04-20: 2 via ORAL
  Filled 2014-04-20: qty 2

## 2014-04-20 NOTE — Progress Notes (Signed)
CSW met with pt at bedside. Family was present. Nurse CM was present. Patient confirms that he presents to [facility] due to falling while trying to transfer himself from his wheelchair.   Granddaughter states that pt lives with her in [place]. She states that the pt has fell x2 in the past 6 months. Also, she states that pt is wheelchair bound and may have knee surgery.  Patient informed CSW that he can feed and dress himself independently. However, he states he needs assistance with bathing. Granddaughter expressed concerns about having someone at home with the pt while making transfers. Nurse CM spoke the family about possible home health arrangements.  CSW gave granddaughter a list of ALF. Granddaughter states that she will file for medicaid for the pt.  Family and pt state that they do not have any questions.  Willette Brace [phone] ED CSW 04/20/2014 10:44 PM

## 2014-04-20 NOTE — ED Notes (Signed)
Pt given half cup of coffee

## 2014-04-20 NOTE — ED Notes (Signed)
Patient transported to X-ray 

## 2014-04-20 NOTE — ED Notes (Signed)
Social worker at bedside.

## 2014-04-20 NOTE — Discharge Instructions (Signed)

## 2014-04-20 NOTE — ED Notes (Signed)
Returned from CT.

## 2014-04-20 NOTE — ED Notes (Signed)
Pt not in room.

## 2014-04-20 NOTE — ED Notes (Signed)
Patient is resting comfortably, visitors at bedside

## 2014-04-20 NOTE — ED Notes (Signed)
Bed: HK06 Expected date:  Expected time:  Means of arrival:  Comments: EMS- 81yo M, fall, no deformity

## 2014-04-20 NOTE — Progress Notes (Addendum)
CARE MANAGEMENT ED NOTE 04/20/2014  Patient:  Jermaine [name], Jermaine [name]   Account Number:  [id number]  Date Initiated:  04/20/2014  Documentation initiated by:  Livia Snellen  Subjective/Objective Assessment:   Patient presents to Ed with fall while transferring from his wheelchair to the couch.     Subjective/Objective Assessment Detail:     Action/Plan:   Discharge home with home health services   Action/Plan Detail:   Anticipated DC Date:  04/20/2014     Status Recommendation to Physician:   Result of Recommendation:    Other ED [place]  Other  Outpatient Services - Pt will follow up   [place]   Choice offered to / List presented to:  C-4 Adult Children  DME arranged  [place]     DME agency  [place] arranged  HH-1 RN  [place]      [place].    Status of service:  Completed, signed off  ED Comments:   ED Comments Detail:  EDCM spoke to patient and his grand daughter [name] [phone] at bedside.  Per [name], patient now lives with her in [place].  Patient is non ambulatory at home, uses his wheelchair for mobility.  Patient does nto have home health services at this time.  Patient reports his pcp is located in [place], Dr. Legrand Rams.  Patient reports he hasn't been able to ambulate because, "I got a knee problem."  Patient reports he is supposed to have surgery on his left knee at some point.  Patient's left knee causes him pain.  Patient has a bedside commode and a wheelchair at home.  Patient's grand daughter requesting a walker and shower chair (the kind that swings out of the shower). Patient's grand daughter reports they would like to find a place where patient can receive 24 hour care.  [facility] encouraged patient to apply for Medicaid for admission into ALF.   Patient reports he applied for Medicaid but was denied.  EDCM provided [employer] of home health agencies in [facility], list of private duty nursing agencies, printed information about [facility], printed information about [facility] [employer], contact information for [employer] and explained services for each.  EDCM informed patient's grand daughter that someone will have to be present in the home to allow home health agency to come in. EDCM informed patient and his family that home health agency of their choice has 24-48 hours to contact them and that private duty nursing services will be an out of pocket expense for them.  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  EDCM encouraged [name] to contact [facility] for [facility] assistance or placement as well.  [name] plans on being with the patient tonite and tomorrow.  Discussed patient with EDP who placed home health orders for RN, PT, OT, aide and Education officer, museum.  [facility] faxed home health orders to [facility] at 2205pm with confirmation of receipt at 2208pm. No further EDCM needs at this time.   04/22/2014 A. Wanya Bangura RNCM 1746pm EDCM called and spoke to patient's grand daughter [name] for follow up.  Per [name], the [facility] nurse will be coming within the next three days.  She reports she will be going to [facility] location to pick up shower chair and  walker.  She reports she is speaking to [place] for assistance presently.  She reports she has not called care patrol yet but she will.  [name] thankful for follow up phone call.  No further EDCM needs at this time.

## 2014-04-20 NOTE — ED Notes (Signed)
MD at bedside. 

## 2014-04-20 NOTE — ED Notes (Signed)
Pt from home. Pt reports he fell while transferrring from his wheelchair to his couch. Fell on bottom. Pt complains of L hip discomfort. No deformity. Pt moving both legs equally.

## 2014-04-20 NOTE — ED Provider Notes (Signed)
CSN: [id number]     Arrival date & time 04/20/14  1726 History   First MD Initiated Contact with Patient 04/20/14 1734     Chief Complaint  Patient presents with  . Fall  . Hip Pain     (Consider location/radiation/quality/duration/timing/severity/associated sxs/prior Treatment) HPI   81 year old male with left hip pain. Acute onset just before arrival. Patient was transferring from his wheelchair to his couch when he fell onto his butt. He is having persistent left pain since then. No acute numbness or tingling. He is able to range it although with increased pain. Patient is close to bed bound at baseline.  Past Medical History  Diagnosis Date  . COPD (chronic obstructive pulmonary disease)   . Chronic hip pain     Avascular necrosis, bilateral hips.  . DVT (deep venous thrombosis)   . PE (pulmonary embolism) May/2012.  Marland Kitchen Alcohol abuse   . Elevated PSA     Lost to followup.  . Macrocytic anemia   . Lytic lesion of bone on x-ray May/2012.    Negative bone scan.  . Bilateral pneumonia May/2012.  Marland Kitchen Thoracic compression fracture   . Tobacco abuse   . Alcohol withdrawal syndrome 06/10/2011  . Delirium, drug-induced 06/2011    From Ativan  . Abnormal thyroid function test 06/2011.    Normal TSH and slightly low free T4.  Marland Kitchen Hypoglycemia   . Dysphagia   . Syncope and collapse   . Hematuria   . Spinal stenosis   . Cancer     malignant neoplasm of prostate  . Venous thrombosis   . 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 Chronic back pain    Past Surgical History  Procedure Laterality Date  . Total hip arthroplasty      Left hip.  . Tonsillectomy      as child  . Cystoscopy  10/17/2011    Procedure: CYSTOSCOPY;  Surgeon: Malka So, MD;  Location: [facility];  Service: Urology;  Laterality: N/A;  Cystoscopy/Orchiectomy/Transrectal Ultrasound Guided Prostate Biopsy  . Orchiectomy  10/17/2011    Procedure: ORCHIECTOMY;  Surgeon: Malka So, MD;  Location: [facility];   Service: Urology;  Laterality: Bilateral;  . Prostate biopsy  10/17/2011    Procedure: BIOPSY TRANSRECTAL ULTRASONIC PROSTATE (TUBP);  Surgeon: Malka So, MD;  Location: [facility];  Service: Urology;  Laterality: N/A;  . Esophagogastroduodenoscopy N/A 09/25/2012    WUJ:WJXBJY hernia; duodenal bulbar erosions-status post gastric biopsy, Revealed moderate chronic inactive gastritis, negative H.pylori  . Colonoscopy N/A 11/06/2012    NWG:NFAOZHYQ colonic polyps removed as described above/Colonic diverticulosis. Tubular adenomas. TCS 11/2015 if overall health permits.  . Esophagogastroduodenoscopy (egd) with esophageal dilation N/A 04/01/2013    Dr. Gareth Morgan ring- s/p dilation,m hiatla hernia, duodenal polypoid lesion, bx= benign brunner's glands   Family History  Problem Relation Age of Onset  . Colon cancer Father    History  Substance Use Topics  . Smoking status: Current Every Day Smoker -- 0.50 packs/day for 60 years    Types: Cigarettes  . Smokeless tobacco: Never Used  . Alcohol Use: 24.0 oz/week    40 Shots of liquor per week     Comment: hx of ETOH abuse in past, none since September 2014    Review of Systems  All systems reviewed and negative, other than as noted in HPI.   Allergies  Review of patient's allergies indicates no known allergies.  Home Medications   Prior to  Admission medications   Medication Sig Start Date End Date Taking? Authorizing Provider  cephALEXin (KEFLEX) 500 MG capsule Take 1 capsule (500 mg total) by mouth 3 (three) times daily. Patient not taking: Reported on 12/22/2013 05/21/13   Milton Ferguson, MD  PROAIR HFA 108 (90 BASE) MCG/ACT inhaler Inhale 2 puffs into the lungs every 6 (six) hours as needed for wheezing or shortness of breath.  09/16/13   Historical Provider, MD  traMADol (ULTRAM) 50 MG tablet Take 1 tablet (50 mg total) by mouth every 6 (six) hours as needed. Patient not taking: Reported on 04/20/2014 12/22/13   Orpah Greek,  MD  traMADol (ULTRAM) 50 MG tablet Take 1 tablet (50 mg total) by mouth every 6 (six) hours as needed. Patient not taking: Reported on 04/20/2014 12/22/13   Orpah Greek, MD   BP 112/61 mmHg  Pulse 86  Temp(Src) 99 F (37.2 C) (Oral)  Resp 16  SpO2 94% Physical Exam  Constitutional: He appears well-developed and well-nourished. No distress.  Cachectic and frail-appearing  HENT:  Head: Normocephalic and atraumatic.  Eyes: Conjunctivae are normal. Right eye exhibits no discharge. Left eye exhibits no discharge.  Neck: Neck supple.  Cardiovascular: Normal rate, regular rhythm and normal heart sounds.  Exam reveals no gallop and no friction rub.   No murmur heard. Pulmonary/Chest: Effort normal and breath sounds normal. No respiratory distress.  Abdominal: Soft. He exhibits no distension. There is no tenderness.  Musculoskeletal: He exhibits no edema or tenderness.  Evidence of prior left hip surgery. Mild tenderness to palpation over the  Neurological: He is alert.  Skin: Skin is warm and dry.  Psychiatric: He has a normal mood and affect. His behavior is normal. Thought content normal.  Nursing note and vitals reviewed.   ED Course  Procedures (including critical care time) Labs Review Labs Reviewed - No data to display  Imaging Review Ct Hip Left Wo Contrast  04/20/2014   CLINICAL DATA:  Fall while transferring from wheelchair to couch. Left hip pain. Initial encounter.  EXAM: CT OF THE LEFT HIP WITHOUT CONTRAST  TECHNIQUE: Multidetector CT imaging of the left hip was performed according to the standard protocol. Multiplanar CT image reconstructions were also generated.  COMPARISON:  Left hip radiographs earlier today.  FINDINGS: Chronic posttraumatic deformity is again seen of the proximal left femur with dynamic hip screw and lateral plate fixation remaining in place. This fixation hardware results in areas of mild-to-moderate streak artifact. Allowing for this and  limitation of diffuse osteopenia, no acute fracture is identified. There is chronic left hip joint space narrowing. Regions of mixed subchondral sclerosis and lucency in the femoral head with slight femoral head flattening may reflect osteonecrosis. Heterotopic ossification is noted anterior to the proximal femur. Femoral arterial vascular calcification is noted.  IMPRESSION: Postsurgical and posttraumatic changes in the proximal left femur as above. No acute fracture identified.   Electronically Signed   By: Logan Bores   On: 04/20/2014 20:40   Dg Hip Unilat With Pelvis 2-3 Views Left  04/20/2014   CLINICAL DATA:  Acute left hip pain after fall at home. Initial encounter.  EXAM: LEFT HIP (WITH PELVIS) 2-3 VIEWS  COMPARISON:  December 22, 2013.  FINDINGS: Status post surgical internal fixation of old proximal left femoral fracture. No acute fracture or dislocation is noted. Severe degenerative change of left hip is noted. Diffuse osteopenia is noted.  IMPRESSION: Postsurgical and degenerative changes as described above. No acute fracture or dislocation  is noted.   Electronically Signed   By: Marijo Conception, M.D.   On: 04/20/2014 18:24     EKG Interpretation None      MDM   Final diagnoses:  Hip pain    81 year old male with hip pain. Plain films and CT without acute abnormality. File so for discharge. Social worker also met with patient to facilitate possible additional assistance at his residence.  Virgel Manifold, MD 04/22/14 [phone]

## 2014-04-23 DIAGNOSIS — I82409 Acute embolism and thrombosis of unspecified deep veins of unspecified lower extremity: Secondary | ICD-10-CM | POA: Diagnosis not present

## 2014-04-23 DIAGNOSIS — I213 ST elevation (STEMI) myocardial infarction of unspecified site: Secondary | ICD-10-CM | POA: Diagnosis not present

## 2014-04-23 DIAGNOSIS — F1020 Alcohol dependence, uncomplicated: Secondary | ICD-10-CM | POA: Diagnosis not present

## 2014-04-23 DIAGNOSIS — R55 Syncope and collapse: Secondary | ICD-10-CM | POA: Diagnosis not present

## 2014-04-23 DIAGNOSIS — F17210 Nicotine dependence, cigarettes, uncomplicated: Secondary | ICD-10-CM | POA: Diagnosis not present

## 2014-04-23 DIAGNOSIS — Z8546 Personal history of malignant neoplasm of prostate: Secondary | ICD-10-CM | POA: Diagnosis not present

## 2014-04-23 DIAGNOSIS — M25551 Pain in right hip: Secondary | ICD-10-CM | POA: Diagnosis not present

## 2014-04-23 DIAGNOSIS — M25552 Pain in left hip: Secondary | ICD-10-CM | POA: Diagnosis not present

## 2014-04-23 DIAGNOSIS — D509 Iron deficiency anemia, unspecified: Secondary | ICD-10-CM | POA: Diagnosis not present

## 2014-04-23 DIAGNOSIS — J449 Chronic obstructive pulmonary disease, unspecified: Secondary | ICD-10-CM | POA: Diagnosis not present

## 2014-04-23 DIAGNOSIS — Z9181 History of falling: Secondary | ICD-10-CM | POA: Diagnosis not present

## 2014-04-23 DIAGNOSIS — J189 Pneumonia, unspecified organism: Secondary | ICD-10-CM | POA: Diagnosis not present

## 2014-04-24 DIAGNOSIS — Z8546 Personal history of malignant neoplasm of prostate: Secondary | ICD-10-CM | POA: Diagnosis not present

## 2014-04-24 DIAGNOSIS — M25551 Pain in right hip: Secondary | ICD-10-CM | POA: Diagnosis not present

## 2014-04-24 DIAGNOSIS — D509 Iron deficiency anemia, unspecified: Secondary | ICD-10-CM | POA: Diagnosis not present

## 2014-04-24 DIAGNOSIS — J449 Chronic obstructive pulmonary disease, unspecified: Secondary | ICD-10-CM | POA: Diagnosis not present

## 2014-04-24 DIAGNOSIS — R296 Repeated falls: Secondary | ICD-10-CM | POA: Diagnosis not present

## 2014-04-24 DIAGNOSIS — F1020 Alcohol dependence, uncomplicated: Secondary | ICD-10-CM | POA: Diagnosis not present

## 2014-04-24 DIAGNOSIS — I82409 Acute embolism and thrombosis of unspecified deep veins of unspecified lower extremity: Secondary | ICD-10-CM | POA: Diagnosis not present

## 2014-04-24 DIAGNOSIS — Z9181 History of falling: Secondary | ICD-10-CM | POA: Diagnosis not present

## 2014-04-24 DIAGNOSIS — M25552 Pain in left hip: Secondary | ICD-10-CM | POA: Diagnosis not present

## 2014-04-24 DIAGNOSIS — F17210 Nicotine dependence, cigarettes, uncomplicated: Secondary | ICD-10-CM | POA: Diagnosis not present

## 2014-04-27 DIAGNOSIS — J449 Chronic obstructive pulmonary disease, unspecified: Secondary | ICD-10-CM | POA: Diagnosis not present

## 2014-04-27 DIAGNOSIS — F17210 Nicotine dependence, cigarettes, uncomplicated: Secondary | ICD-10-CM | POA: Diagnosis not present

## 2014-04-27 DIAGNOSIS — F1020 Alcohol dependence, uncomplicated: Secondary | ICD-10-CM | POA: Diagnosis not present

## 2014-04-27 DIAGNOSIS — D509 Iron deficiency anemia, unspecified: Secondary | ICD-10-CM | POA: Diagnosis not present

## 2014-04-27 DIAGNOSIS — M25552 Pain in left hip: Secondary | ICD-10-CM | POA: Diagnosis not present

## 2014-04-27 DIAGNOSIS — Z8546 Personal history of malignant neoplasm of prostate: Secondary | ICD-10-CM | POA: Diagnosis not present

## 2014-04-27 DIAGNOSIS — I82409 Acute embolism and thrombosis of unspecified deep veins of unspecified lower extremity: Secondary | ICD-10-CM | POA: Diagnosis not present

## 2014-04-27 DIAGNOSIS — M25551 Pain in right hip: Secondary | ICD-10-CM | POA: Diagnosis not present

## 2014-04-27 DIAGNOSIS — Z9181 History of falling: Secondary | ICD-10-CM | POA: Diagnosis not present

## 2014-04-29 DIAGNOSIS — I82409 Acute embolism and thrombosis of unspecified deep veins of unspecified lower extremity: Secondary | ICD-10-CM | POA: Diagnosis not present

## 2014-04-29 DIAGNOSIS — Z9181 History of falling: Secondary | ICD-10-CM | POA: Diagnosis not present

## 2014-04-29 DIAGNOSIS — Z8546 Personal history of malignant neoplasm of prostate: Secondary | ICD-10-CM | POA: Diagnosis not present

## 2014-04-29 DIAGNOSIS — J449 Chronic obstructive pulmonary disease, unspecified: Secondary | ICD-10-CM | POA: Diagnosis not present

## 2014-04-29 DIAGNOSIS — D509 Iron deficiency anemia, unspecified: Secondary | ICD-10-CM | POA: Diagnosis not present

## 2014-04-29 DIAGNOSIS — M25552 Pain in left hip: Secondary | ICD-10-CM | POA: Diagnosis not present

## 2014-04-29 DIAGNOSIS — F1020 Alcohol dependence, uncomplicated: Secondary | ICD-10-CM | POA: Diagnosis not present

## 2014-04-29 DIAGNOSIS — M25551 Pain in right hip: Secondary | ICD-10-CM | POA: Diagnosis not present

## 2014-04-29 DIAGNOSIS — F17210 Nicotine dependence, cigarettes, uncomplicated: Secondary | ICD-10-CM | POA: Diagnosis not present

## 2014-05-10 ENCOUNTER — Emergency Department (HOSPITAL_COMMUNITY): Payer: Medicare Other

## 2014-05-10 ENCOUNTER — Inpatient Hospital Stay (HOSPITAL_COMMUNITY)
Admission: EM | Admit: 2014-05-10 | Discharge: 2014-06-03 | DRG: 177 | Disposition: E | Payer: Medicare Other | Attending: Internal Medicine | Admitting: Internal Medicine

## 2014-05-10 ENCOUNTER — Encounter (HOSPITAL_COMMUNITY): Payer: Self-pay | Admitting: Emergency Medicine

## 2014-05-10 ENCOUNTER — Other Ambulatory Visit (HOSPITAL_COMMUNITY): Payer: Self-pay

## 2014-05-10 DIAGNOSIS — R911 Solitary pulmonary nodule: Secondary | ICD-10-CM | POA: Diagnosis not present

## 2014-05-10 DIAGNOSIS — I469 Cardiac arrest, cause unspecified: Secondary | ICD-10-CM | POA: Diagnosis not present

## 2014-05-10 DIAGNOSIS — I959 Hypotension, unspecified: Secondary | ICD-10-CM | POA: Diagnosis present

## 2014-05-10 DIAGNOSIS — J9811 Atelectasis: Secondary | ICD-10-CM | POA: Diagnosis not present

## 2014-05-10 DIAGNOSIS — R4020 Unspecified coma: Secondary | ICD-10-CM

## 2014-05-10 DIAGNOSIS — G8929 Other chronic pain: Secondary | ICD-10-CM | POA: Diagnosis not present

## 2014-05-10 DIAGNOSIS — J90 Pleural effusion, not elsewhere classified: Secondary | ICD-10-CM | POA: Diagnosis not present

## 2014-05-10 DIAGNOSIS — Z86711 Personal history of pulmonary embolism: Secondary | ICD-10-CM | POA: Diagnosis not present

## 2014-05-10 DIAGNOSIS — I712 Thoracic aortic aneurysm, without rupture: Secondary | ICD-10-CM | POA: Diagnosis not present

## 2014-05-10 DIAGNOSIS — J690 Pneumonitis due to inhalation of food and vomit: Principal | ICD-10-CM | POA: Diagnosis present

## 2014-05-10 DIAGNOSIS — T68XXXA Hypothermia, initial encounter: Secondary | ICD-10-CM | POA: Diagnosis not present

## 2014-05-10 DIAGNOSIS — R918 Other nonspecific abnormal finding of lung field: Secondary | ICD-10-CM | POA: Diagnosis not present

## 2014-05-10 DIAGNOSIS — I499 Cardiac arrhythmia, unspecified: Secondary | ICD-10-CM | POA: Diagnosis not present

## 2014-05-10 DIAGNOSIS — E43 Unspecified severe protein-calorie malnutrition: Secondary | ICD-10-CM | POA: Diagnosis present

## 2014-05-10 DIAGNOSIS — Z8546 Personal history of malignant neoplasm of prostate: Secondary | ICD-10-CM

## 2014-05-10 DIAGNOSIS — Z86718 Personal history of other venous thrombosis and embolism: Secondary | ICD-10-CM

## 2014-05-10 DIAGNOSIS — R05 Cough: Secondary | ICD-10-CM

## 2014-05-10 DIAGNOSIS — Z993 Dependence on wheelchair: Secondary | ICD-10-CM

## 2014-05-10 DIAGNOSIS — I214 Non-ST elevation (NSTEMI) myocardial infarction: Secondary | ICD-10-CM | POA: Diagnosis not present

## 2014-05-10 DIAGNOSIS — R42 Dizziness and giddiness: Secondary | ICD-10-CM | POA: Diagnosis not present

## 2014-05-10 DIAGNOSIS — Z96642 Presence of left artificial hip joint: Secondary | ICD-10-CM | POA: Diagnosis present

## 2014-05-10 DIAGNOSIS — Z681 Body mass index (BMI) 19 or less, adult: Secondary | ICD-10-CM | POA: Diagnosis not present

## 2014-05-10 DIAGNOSIS — F1020 Alcohol dependence, uncomplicated: Secondary | ICD-10-CM | POA: Diagnosis present

## 2014-05-10 DIAGNOSIS — F17210 Nicotine dependence, cigarettes, uncomplicated: Secondary | ICD-10-CM | POA: Diagnosis present

## 2014-05-10 DIAGNOSIS — E876 Hypokalemia: Secondary | ICD-10-CM

## 2014-05-10 DIAGNOSIS — R55 Syncope and collapse: Secondary | ICD-10-CM | POA: Diagnosis present

## 2014-05-10 DIAGNOSIS — J449 Chronic obstructive pulmonary disease, unspecified: Secondary | ICD-10-CM | POA: Diagnosis present

## 2014-05-10 DIAGNOSIS — I951 Orthostatic hypotension: Secondary | ICD-10-CM | POA: Diagnosis present

## 2014-05-10 DIAGNOSIS — I213 ST elevation (STEMI) myocardial infarction of unspecified site: Secondary | ICD-10-CM | POA: Diagnosis not present

## 2014-05-10 DIAGNOSIS — F1010 Alcohol abuse, uncomplicated: Secondary | ICD-10-CM | POA: Diagnosis not present

## 2014-05-10 DIAGNOSIS — J189 Pneumonia, unspecified organism: Secondary | ICD-10-CM | POA: Diagnosis not present

## 2014-05-10 DIAGNOSIS — E041 Nontoxic single thyroid nodule: Secondary | ICD-10-CM | POA: Diagnosis not present

## 2014-05-10 DIAGNOSIS — R059 Cough, unspecified: Secondary | ICD-10-CM

## 2014-05-10 LAB — CBC WITH DIFFERENTIAL/PLATELET
BASOS ABS: 0.1 10*3/uL (ref 0.0–0.1)
BASOS PCT: 1 % (ref 0–1)
EOS ABS: 0.1 10*3/uL (ref 0.0–0.7)
Eosinophils Relative: 1 % (ref 0–5)
HCT: 33.9 % — ABNORMAL LOW (ref 39.0–52.0)
HEMOGLOBIN: 12 g/dL — AB (ref 13.0–17.0)
Lymphocytes Relative: 50 % — ABNORMAL HIGH (ref 12–46)
Lymphs Abs: 2.4 10*3/uL (ref 0.7–4.0)
MCH: 34.4 pg — AB (ref 26.0–34.0)
MCHC: 35.4 g/dL (ref 30.0–36.0)
MCV: 97.1 fL (ref 78.0–100.0)
Monocytes Absolute: 0.5 10*3/uL (ref 0.1–1.0)
Monocytes Relative: 10 % (ref 3–12)
NEUTROS ABS: 1.9 10*3/uL (ref 1.7–7.7)
Neutrophils Relative %: 38 % — ABNORMAL LOW (ref 43–77)
PLATELETS: 248 10*3/uL (ref 150–400)
RBC: 3.49 MIL/uL — ABNORMAL LOW (ref 4.22–5.81)
RDW: 19.2 % — AB (ref 11.5–15.5)
WBC: 4.9 10*3/uL (ref 4.0–10.5)

## 2014-05-10 LAB — COMPREHENSIVE METABOLIC PANEL
ALBUMIN: 2.9 g/dL — AB (ref 3.5–5.0)
ALK PHOS: 146 U/L — AB (ref 38–126)
ALT: 7 U/L — ABNORMAL LOW (ref 17–63)
AST: 20 U/L (ref 15–41)
Anion gap: 13 (ref 5–15)
BILIRUBIN TOTAL: 1.6 mg/dL — AB (ref 0.3–1.2)
BUN: 10 mg/dL (ref 6–20)
CHLORIDE: 96 mmol/L — AB (ref 101–111)
CO2: 28 mmol/L (ref 22–32)
Calcium: 7.7 mg/dL — ABNORMAL LOW (ref 8.9–10.3)
Creatinine, Ser: 0.83 mg/dL (ref 0.61–1.24)
GFR calc Af Amer: >60 mL/min (ref >60)
GFR calc non Af Amer: >60 mL/min (ref >60)
Glucose, Bld: 88 mg/dL (ref 70–99)
POTASSIUM: 2.7 mmol/L — AB (ref 3.5–5.1)
SODIUM: 137 mmol/L (ref 135–145)
TOTAL PROTEIN: 6.9 g/dL (ref 6.5–8.1)

## 2014-05-10 LAB — ETHANOL

## 2014-05-10 LAB — TROPONIN I: Troponin I: <0.03 ng/mL (ref <0.031)

## 2014-05-10 LAB — MAGNESIUM: Magnesium: 1.2 mg/dL — ABNORMAL LOW (ref 1.7–2.4)

## 2014-05-10 MED ORDER — ONDANSETRON HCL 4 MG/2ML IJ SOLN
4.0000 mg | Freq: Four times a day (QID) | INTRAMUSCULAR | Status: DC | PRN
  Administered 2014-05-13: 4 mg via INTRAVENOUS
  Filled 2014-05-10: qty 2

## 2014-05-10 MED ORDER — SODIUM CHLORIDE 0.9 % IV BOLUS (SEPSIS)
1000.0000 mL | Freq: Once | INTRAVENOUS | Status: AC
Start: 2014-05-10 — End: 2014-05-10
  Administered 2014-05-10: 1000 mL via INTRAVENOUS

## 2014-05-10 MED ORDER — ONDANSETRON HCL 4 MG PO TABS: 4.0000 mg | ORAL_TABLET | Freq: Four times a day (QID) | ORAL | Status: DC | PRN

## 2014-05-10 MED ORDER — FOLIC ACID 1 MG PO TABS
1.0000 mg | ORAL_TABLET | Freq: Every day | ORAL | Status: DC
  Administered 2014-05-10 – 2014-05-14 (×5): 1 mg via ORAL
  Filled 2014-05-10 (×5): qty 1

## 2014-05-10 MED ORDER — ACETAMINOPHEN 650 MG RE SUPP: 650.0000 mg | Freq: Four times a day (QID) | RECTAL | Status: DC | PRN

## 2014-05-10 MED ORDER — LORAZEPAM 2 MG/ML IJ SOLN
0.0000 mg | Freq: Four times a day (QID) | INTRAMUSCULAR | Status: AC
Start: 2014-05-10 — End: 2014-05-12

## 2014-05-10 MED ORDER — LORAZEPAM 2 MG/ML IJ SOLN
1.0000 mg | Freq: Four times a day (QID) | INTRAMUSCULAR | Status: AC | PRN
  Administered 2014-05-12: 1 mg via INTRAVENOUS
  Filled 2014-05-10 (×2): qty 1

## 2014-05-10 MED ORDER — SODIUM CHLORIDE 0.9 % IV SOLN
INTRAVENOUS | Status: DC
  Administered 2014-05-10 – 2014-05-12 (×4): via INTRAVENOUS

## 2014-05-10 MED ORDER — POTASSIUM CHLORIDE 10 MEQ/100ML IV SOLN
10.0000 meq | INTRAVENOUS | Status: DC
  Administered 2014-05-10: 10 meq via INTRAVENOUS
  Filled 2014-05-10: qty 100

## 2014-05-10 MED ORDER — VITAMIN B-1 100 MG PO TABS
100.0000 mg | ORAL_TABLET | Freq: Every day | ORAL | Status: DC
  Administered 2014-05-10 – 2014-05-14 (×5): 100 mg via ORAL
  Filled 2014-05-10 (×5): qty 1

## 2014-05-10 MED ORDER — LORAZEPAM 1 MG PO TABS: 1.0000 mg | ORAL_TABLET | Freq: Four times a day (QID) | ORAL | Status: AC | PRN

## 2014-05-10 MED ORDER — ACETAMINOPHEN 325 MG PO TABS
650.0000 mg | ORAL_TABLET | Freq: Four times a day (QID) | ORAL | Status: DC | PRN
  Administered 2014-05-13: 650 mg via ORAL
  Filled 2014-05-10: qty 2

## 2014-05-10 MED ORDER — THIAMINE HCL 100 MG/ML IJ SOLN: 100.0000 mg | Freq: Every day | INTRAMUSCULAR | Status: DC

## 2014-05-10 MED ORDER — POTASSIUM CHLORIDE CRYS ER 20 MEQ PO TBCR
40.0000 meq | EXTENDED_RELEASE_TABLET | ORAL | Status: DC
  Filled 2014-05-10: qty 2

## 2014-05-10 MED ORDER — POTASSIUM CHLORIDE 20 MEQ/15ML (10%) PO SOLN
40.0000 meq | Freq: Three times a day (TID) | ORAL | Status: AC
  Administered 2014-05-10 – 2014-05-11 (×3): 40 meq via ORAL
  Filled 2014-05-10 (×3): qty 30

## 2014-05-10 MED ORDER — LORAZEPAM 2 MG/ML IJ SOLN
0.0000 mg | Freq: Two times a day (BID) | INTRAMUSCULAR | Status: AC
  Administered 2014-05-13: 1 mg via INTRAVENOUS

## 2014-05-10 MED ORDER — SODIUM CHLORIDE 0.9 % IV SOLN
1000.0000 mL | Freq: Once | INTRAVENOUS | Status: AC
  Administered 2014-05-10: 1000 mL via INTRAVENOUS

## 2014-05-10 MED ORDER — SENNOSIDES-DOCUSATE SODIUM 8.6-50 MG PO TABS: 1.0000 | ORAL_TABLET | Freq: Every evening | ORAL | Status: DC | PRN

## 2014-05-10 MED ORDER — SODIUM CHLORIDE 0.9 % IJ SOLN
3.0000 mL | Freq: Two times a day (BID) | INTRAMUSCULAR | Status: DC
  Administered 2014-05-12 – 2014-05-13 (×2): 3 mL via INTRAVENOUS

## 2014-05-10 MED ORDER — HEPARIN SODIUM (PORCINE) 5000 UNIT/ML IJ SOLN
5000.0000 [IU] | Freq: Three times a day (TID) | INTRAMUSCULAR | Status: DC
  Administered 2014-05-10 – 2014-05-14 (×9): 5000 [IU] via SUBCUTANEOUS
  Filled 2014-05-10 (×9): qty 1

## 2014-05-10 MED ORDER — ADULT MULTIVITAMIN W/MINERALS CH
1.0000 | ORAL_TABLET | Freq: Every day | ORAL | Status: DC
  Administered 2014-05-11 – 2014-05-14 (×4): 1 via ORAL
  Filled 2014-05-10 (×5): qty 1

## 2014-05-10 MED ORDER — POTASSIUM CHLORIDE 10 MEQ/100ML IV SOLN
10.0000 meq | INTRAVENOUS | Status: AC
  Administered 2014-05-10 (×2): 10 meq via INTRAVENOUS
  Filled 2014-05-10: qty 100

## 2014-05-10 MED ORDER — SODIUM CHLORIDE 0.9 % IV BOLUS (SEPSIS)
500.0000 mL | Freq: Once | INTRAVENOUS | Status: AC
  Administered 2014-05-10: 500 mL via INTRAVENOUS

## 2014-05-10 NOTE — Progress Notes (Addendum)
Patient refuses to take PO potassium and multi vitamin, states they are too big to swallow, but will not take crushed in apple sauce or pudding, either.  Patient also refusing to attempt orthostatic VS, states he can not stand and will not cooperate for assistance.  Skin assessment also incomplete, patient refuses to remove pants at this time.  Will continue to monitor patient.  Patient irritated at this time and upset d/t arm pain from IV K+ runs.  IV K+ rate slowed and diluted. MD paged to make aware

## 2014-05-10 NOTE — ED Notes (Signed)
Patient reports "passing out twice yesterday while laying on cough. Patient reports dizziness with "hot and cold spells." Patient also reports a "hard, dry cough." Patient states chest only hurts with cough. Denies any other pain.

## 2014-05-10 NOTE — ED Notes (Signed)
CRITICAL VALUE ALERT  Critical value received:  Potassium 2.7  Date of notification:  06/01/2014  Time of notification:  1307 Critical value read back: yes  Nurse who received alert:  Di Kindle, RN MD notified (1st page):  Dr. Jeneen Rinks  Time of first page:  1307  MD notified (2nd page):  Time of second page:  Responding MD:  Dr. Jeneen Rinks  Time MD responded:  [phone]

## 2014-05-10 NOTE — ED Notes (Signed)
Pt states he has not actually passed out. Pt has not been eating. Denies diarrhea or emesis. MD at bedside.

## 2014-05-10 NOTE — ED Notes (Signed)
Pt given meal to eat, okayed with Dr. Jeneen Rinks. Pt sitting up in bed to eat.

## 2014-05-10 NOTE — Progress Notes (Signed)
Pt c/o burning to IV receiving Potassium. Previous nurse decreased rate of IV for pt's comfort. Pt detatched IV with Potassium running and refused to take any further IV Potassium. MD paged and new orders given for PO Potassium.

## 2014-05-10 NOTE — ED Provider Notes (Signed)
CSN: [id number]     Arrival date & time 05/08/2014  1153 History  This chart was scribed for Tanna Furry, MD by Stephania Fragmin, ED Scribe. This patient was seen in room APA18/[facility]18 and the patient's care was started at 12:21 PM.    Chief Complaint  Patient presents with  . Loss of Consciousness   Patient is a 81 y.o. male presenting with syncope. The history is provided by the patient. No language interpreter was used.  Loss of Consciousness Associated symptoms: dizziness and nausea     HPI Comments: Jermaine [name] is a 81 y.o. male who presents to the Emergency Department.  He complains of a severe, dry cough for the last 2 days He states he only ambulates with a wheelchair.  Patient states that this morning, he had gotten out of bed, gotten into his wheelchair, and was about to get coffee from the kitchen when he went felt dizzy. He then moved himself over to the couch.  He states he had 2 episodes yesterday where he was lightheaded when he "fainted".  He states that he was afraid he would pass out. He laid back on the couch for a short time and then felt better. He attempted to get up again to his wheelchair, and he became lightheaded and dizzy. He had to stay on the couch. Apparently he is staying with a friend. Friend became concerned. He was transported here.  Patient states she is not currently taking any medications. Denies any alcohol use. Still smokes a half-pack per day and has for over 60 years.  C Ziegler little over the last 2 days. He had a poor appetite yesterday and vomiting. No diarrhea. No chest pain or abdominal pain.  Past Medical History  Diagnosis Date  . COPD (chronic obstructive pulmonary disease)   . Chronic hip pain     Avascular necrosis, bilateral hips.  . DVT (deep venous thrombosis)   . PE (pulmonary embolism) May/2012.  Marland Kitchen Alcohol abuse   . Elevated PSA     Lost to followup.  . Macrocytic anemia   . Lytic lesion of bone on x-ray May/2012.    Negative bone  scan.  . Bilateral pneumonia May/2012.  Marland Kitchen Thoracic compression fracture   . Tobacco abuse   . Alcohol withdrawal syndrome 06/10/2011  . Delirium, drug-induced 06/2011    From Ativan  . Abnormal thyroid function test 06/2011.    Normal TSH and slightly low free T4.  Marland Kitchen Hypoglycemia   . Dysphagia   . Syncope and collapse   . Hematuria   . Spinal stenosis   . Venous thrombosis   . 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 Chronic back pain   . Cancer     malignant neoplasm of prostate   Past Surgical History  Procedure Laterality Date  . Total hip arthroplasty      Left hip.  . Tonsillectomy      as child  . Cystoscopy  10/17/2011    Procedure: CYSTOSCOPY;  Surgeon: Malka So, MD;  Location: [facility];  Service: Urology;  Laterality: N/A;  Cystoscopy/Orchiectomy/Transrectal Ultrasound Guided Prostate Biopsy  . Orchiectomy  10/17/2011    Procedure: ORCHIECTOMY;  Surgeon: Malka So, MD;  Location: [facility];  Service: Urology;  Laterality: Bilateral;  . Prostate biopsy  10/17/2011    Procedure: BIOPSY TRANSRECTAL ULTRASONIC PROSTATE (TUBP);  Surgeon: Malka So, MD;  Location: [facility];  Service: Urology;  Laterality: N/A;  .  Esophagogastroduodenoscopy N/A 09/25/2012    PPJ:KDTOIZ hernia; duodenal bulbar erosions-status post gastric biopsy, Revealed moderate chronic inactive gastritis, negative H.pylori  . Colonoscopy N/A 11/06/2012    TIW:PYKDXIPJ colonic polyps removed as described above/Colonic diverticulosis. Tubular adenomas. TCS 11/2015 if overall health permits.  . Esophagogastroduodenoscopy (egd) with esophageal dilation N/A 04/01/2013    Dr. Gareth Morgan ring- s/p dilation,m hiatla hernia, duodenal polypoid lesion, bx= benign brunner's glands   Family History  Problem Relation Age of Onset  . Colon cancer Father    History  Substance Use Topics  . Smoking status: Current Every Day Smoker -- 0.50 packs/day for 60 years    Types: Cigarettes  . Smokeless tobacco:  Never Used  . Alcohol Use: 24.0 oz/week    40 Shots of liquor per week     Comment: hx of ETOH abuse in past, none since September 2014    Review of Systems  Constitutional: Positive for appetite change and fatigue.  Respiratory: Positive for cough.   Cardiovascular: Positive for syncope.  Gastrointestinal: Positive for nausea.  Neurological: Positive for dizziness and light-headedness.      Allergies  Review of patient's allergies indicates no known allergies.  Home Medications   Prior to Admission medications   Medication Sig Start Date End Date Taking? Authorizing Provider  PROAIR HFA 108 (90 BASE) MCG/ACT inhaler Inhale 2 puffs into the lungs every 6 (six) hours as needed for wheezing or shortness of breath.  09/16/13  Yes Historical Provider, MD  cephALEXin (KEFLEX) 500 MG capsule Take 1 capsule (500 mg total) by mouth 3 (three) times daily. Patient not taking: Reported on 12/22/2013 05/21/13   Milton Ferguson, MD  traMADol (ULTRAM) 50 MG tablet Take 1 tablet (50 mg total) by mouth every 6 (six) hours as needed. Patient not taking: Reported on 04/20/2014 12/22/13   Orpah Greek, MD  traMADol (ULTRAM) 50 MG tablet Take 1 tablet (50 mg total) by mouth every 6 (six) hours as needed. Patient not taking: Reported on 04/20/2014 12/22/13   Orpah Greek, MD   BP 99/70 mmHg  Pulse 85  Temp(Src) 98.2 F (36.8 C) (Oral)  Resp 18  Ht 5\' 11"  (1.803 m)  Wt 130 lb (58.968 kg)  BMI 18.14 kg/m2  SpO2 100% Physical Exam  Constitutional: He is oriented to person, place, and time. No distress.  Thin emaciated-appearing elderly male. No acute distress.  HENT:  Head: Normocephalic.  Nearly edentulous. He has an tubular incisors. Speech somewhat difficult to understand. Mucous membranes slightly dried.  Eyes: Conjunctivae are normal. Pupils are equal, round, and reactive to light. No scleral icterus.  Conjunctivae are not pale. No scleral icterus.  Neck: Normal range of  motion. Neck supple. No thyromegaly present.  Cardiovascular: Normal rate and regular rhythm.  Exam reveals no gallop and no friction rub.   No murmur heard. Pulmonary/Chest: Effort normal and breath sounds normal. No respiratory distress. He has no wheezes. He has no rales.  Thin, the barrel chested. Globally distant breath sounds.  Abdominal: Soft. Bowel sounds are normal. He exhibits no distension. There is no tenderness. There is no rebound.  Musculoskeletal: Normal range of motion.  Neurological: He is alert and oriented to person, place, and time.  Skin: Skin is warm and dry. No rash noted.  Psychiatric: He has a normal mood and affect. His behavior is normal.    ED Course  Procedures (including critical care time)  DIAGNOSTIC STUDIES: Oxygen Saturation is 98% on RA, normal by my interpretation.  COORDINATION OF CARE: 3:58 PM - Discussed treatment plan with pt at bedside which includes ivf, lab and EKG eval and pt agreed to plan.   Labs Review Labs Reviewed  CBC WITH DIFFERENTIAL/PLATELET - Abnormal; Notable for the following:    RBC 3.49 (*)    Hemoglobin 12.0 (*)    HCT 33.9 (*)    MCH 34.4 (*)    RDW 19.2 (*)    Neutrophils Relative % 38 (*)    Lymphocytes Relative 50 (*)    All other components within normal limits  COMPREHENSIVE METABOLIC PANEL - Abnormal; Notable for the following:    Potassium 2.7 (*)    Chloride 96 (*)    Calcium 7.7 (*)    Albumin 2.9 (*)    ALT 7 (*)    Alkaline Phosphatase 146 (*)    Total Bilirubin 1.6 (*)    All other components within normal limits  MAGNESIUM - Abnormal; Notable for the following:    Magnesium 1.2 (*)    All other components within normal limits  COMPREHENSIVE METABOLIC PANEL - Abnormal; Notable for the following:    Potassium 3.4 (*)    Calcium 6.9 (*)    Total Protein 5.3 (*)    Albumin 2.2 (*)    ALT 5 (*)    All other components within normal limits  CBC - Abnormal; Notable for the following:    RBC 2.89  (*)    Hemoglobin 10.0 (*)    HCT 28.2 (*)    MCH 34.6 (*)    RDW 19.3 (*)    All other components within normal limits  CLOSTRIDIUM DIFFICILE BY PCR  TROPONIN I  ETHANOL  BASIC METABOLIC PANEL    Imaging Review Ct Chest Wo Contrast  05/11/2014   CLINICAL DATA:  Acute cough and chest pain.  EXAM: CT CHEST WITHOUT CONTRAST  TECHNIQUE: Multidetector CT imaging of the chest was performed following the standard protocol without IV contrast.  COMPARISON:  CT scan of May 15, 2012.  FINDINGS: No pneumothorax is noted. Large airspace opacity is noted posteriorly in left lower lobe most consistent with pneumonia or possibly atelectasis. Minimal bilateral pleural effusions are noted with adjacent subsegmental atelectasis seen in the right lung base. New 3.2 mm nodule is noted laterally in left upper lobe best seen on image number 33 of series 3. 1.8 cm low density is noted in right thyroid lobe with associated calcification. This is stable compared to prior exam. 4.9 cm ascending thoracic aortic aneurysm is noted which is slightly enlarged compared to prior exam. Atherosclerotic calcifications of coronary arteries are noted. No mediastinal mass or adenopathy is noted. Atherosclerotic calcifications of thoracic aorta and great vessels are noted.  Multiple old compression fractures are noted throughout the thoracic spine. Most of these were present on prior exam. However there is new mild wedge compression deformity of T6 vertebral body with associated rounded sclerosis in its inferior portion. There also noted new rounded sclerotic densities in T7, T11 L1 and L2. This is concerning for possible metastatic disease.  IMPRESSION: Large left posterior basilar opacity is noted most consistent with pneumonia or possibly atelectasis.  Mild bilateral pleural effusions are noted with adjacent subsegmental atelectasis posteriorly in the right lung base.  Stable 1.8 cm low density seen in right thyroid lobe with associated  calcification. Thyroid ultrasound is recommended for further evaluation.  4.9 cm ascending thoracic aortic aneurysm is noted which is slightly enlarged compared to prior exam. Ascending thoracic aortic aneurysm.  Recommend semi-annual imaging followup by CTA or MRA and referral to cardiothoracic surgery if not already obtained. This recommendation follows 2010 ACCF/AHA/AATS/ACR/ASA/SCA/SCAI/SIR/STS/SVM Guidelines for the Diagnosis and Management of Patients With Thoracic Aortic Disease. Circulation. 2010; 121: Z610-R604.  New mild wedge compression deformity of T6 vertebral body is noted which appears to be subacute or chronic. Stable multiple other old compression fractures are noted.  Interval development of multiple sclerotic densities seen throughout the thoracic spine as described above. This is concerning for possible metastatic disease. Bone scan may be performed for further evaluation.  New 3.2 cm nodule is seen in left upper lobe. Given the above described findings, metastatic disease cannot be excluded, and followup CT scan in 3 months is recommended.   Electronically Signed   By: Marijo Conception, M.D.   On: 05/11/2014 09:28   US Soft Tissue Head/neck  05/12/2014   CLINICAL DATA:  81 year old male with a history of right thyroid nodule. Nodule identified on prior chest CT.  EXAM: THYROID ULTRASOUND  TECHNIQUE: Ultrasound examination of the thyroid gland and adjacent soft tissues was performed.  COMPARISON:  Chest CT 05/11/2014  FINDINGS: Right thyroid lobe  Measurements: 5.4 cm x 2.3 cm x 3.1 cm. Dominant right-sided nodule measures 3.0 cm x 1.9 cm x 2.2 cm. Internal reflectors compatible with calcifications.  Left thyroid lobe  Measurements: 5.3 cm x 2.6 cm by 2.2 cm. Single left-sided nodule measures 9 mm.  Isthmus  Thickness: 4 mm.  No nodules visualized.  Lymphadenopathy  None visualized.  IMPRESSION: Multinodular thyroid. Dominant right-sided nodule meets criteria for biopsy. Ultrasound-guided fine  needle aspiration should be considered, as per the consensus statement: Management of Thyroid Nodules Detected at [place]: Society of Radiologists in [place]. Radiology 2005; [id number].  Signed,  Dulcy Fanny. Earleen Newport, DO  [facility]  [facility]   Electronically Signed   By: Corrie Mckusick D.O.   On: 05/12/2014 13:03     EKG Interpretation None      MDM   Final diagnoses:  Cough    Blood pressure down to 80/50. Fluid bolus initiated. He improved to 106. Chest x-ray shows old scarring but no obvious new infiltrates. Potassium low at 2.7.   Discussion:  PT hypotensive and hypokalemic, and describes syncope/near syncope x 3/48 hours.  Admitted for fluids, K+ replacement, and telemetry.    Tanna Furry, MD 05/12/14 [phone]

## 2014-05-10 NOTE — H&P (Addendum)
[facility]          History and Physical    PCP:   Rosita Fire, MD   EDP: Tanna Furry, M.D.  Chief Complaint:  Dizziness, loss of consciousness  HPI: Patient is an 81 year old with history significant for alcohol abuse and chronic pain that has left him wheelchair-bound is also a veteran presents to the hospital today with the above-mentioned complaints. He states that he drinks about half a pint a day and has not been eating or drinking much. When he gets out of bed and transfers into his wheelchair has been feeling very dizzy and today "passed out". He lives with a friend who called 29 who brought him to the hospital. He is found to have a systolic blood pressure in the 80s, appears cachectic and chronically malnourished, is edentulous and is very difficult to understand his speech. In the ED he is also found to have a potassium of 2.7 and we have been asked to admit him for further evaluation and management.  Allergies:  No Known Allergies    Past Medical History  Diagnosis Date  . COPD (chronic obstructive pulmonary disease)   . Chronic hip pain     Avascular necrosis, bilateral hips.  . DVT (deep venous thrombosis)   . PE (pulmonary embolism) May/2012.  Marland Kitchen Alcohol abuse   . Elevated PSA     Lost to followup.  . Macrocytic anemia   . Lytic lesion of bone on x-ray May/2012.    Negative bone scan.  . Bilateral pneumonia May/2012.  Marland Kitchen Thoracic compression fracture   . Tobacco abuse   . Alcohol withdrawal syndrome 06/10/2011  . Delirium, drug-induced 06/2011    From Ativan  . Abnormal thyroid function test 06/2011.    Normal TSH and slightly low free T4.  Marland Kitchen Hypoglycemia   . Dysphagia   . Syncope and collapse   . Hematuria   . Spinal stenosis   . Cancer     malignant neoplasm of prostate  . Venous thrombosis   . 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 Chronic back pain     Past Surgical History  Procedure Laterality Date  . Total hip  arthroplasty      Left hip.  . Tonsillectomy      as child  . Cystoscopy  10/17/2011    Procedure: CYSTOSCOPY;  Surgeon: Malka So, MD;  Location: [facility];  Service: Urology;  Laterality: N/A;  Cystoscopy/Orchiectomy/Transrectal Ultrasound Guided Prostate Biopsy  . Orchiectomy  10/17/2011    Procedure: ORCHIECTOMY;  Surgeon: Malka So, MD;  Location: [facility];  Service: Urology;  Laterality: Bilateral;  . Prostate biopsy  10/17/2011    Procedure: BIOPSY TRANSRECTAL ULTRASONIC PROSTATE (TUBP);  Surgeon: Malka So, MD;  Location: [facility];  Service: Urology;  Laterality: N/A;  . Esophagogastroduodenoscopy N/A 09/25/2012    YKD:XIPJAS hernia; duodenal bulbar erosions-status post gastric biopsy, Revealed moderate chronic inactive gastritis, negative H.pylori  . Colonoscopy N/A 11/06/2012    NKN:LZJQBHAL colonic polyps removed as described above/Colonic diverticulosis. Tubular adenomas. TCS 11/2015 if overall health permits.  . Esophagogastroduodenoscopy (egd) with esophageal dilation N/A 04/01/2013    Dr. Gareth Morgan ring- s/p dilation,m hiatla hernia, duodenal polypoid lesion, bx= benign brunner's glands    Prior to Admission medications   Medication Sig Start Date End Date Taking? Authorizing Provider  PROAIR HFA 108 (90 BASE) MCG/ACT inhaler Inhale  2 puffs into the lungs every 6 (six) hours as needed for wheezing or shortness of breath.  09/16/13  Yes Historical Provider, MD  cephALEXin (KEFLEX) 500 MG capsule Take 1 capsule (500 mg total) by mouth 3 (three) times daily. Patient not taking: Reported on 12/22/2013 05/21/13   Milton Ferguson, MD  traMADol (ULTRAM) 50 MG tablet Take 1 tablet (50 mg total) by mouth every 6 (six) hours as needed. Patient not taking: Reported on 04/20/2014 12/22/13   Orpah Greek, MD  traMADol (ULTRAM) 50 MG tablet Take 1 tablet (50 mg total) by mouth every 6 (six) hours as needed. Patient not taking: Reported on 04/20/2014 12/22/13   Orpah Greek, MD    Social History:  reports that he has been smoking Cigarettes.  He has a 30 pack-year smoking history. He has never used smokeless tobacco. He reports that he drinks about 24.0 oz of alcohol per week. He reports that he does not use illicit drugs.  Family History  Problem Relation Age of Onset  . Colon cancer Father     Review of Systems:  Constitutional: Denies fever, chills, diaphoresis, appetite change and fatigue.  HEENT: Denies photophobia, eye pain, redness, hearing loss, ear pain, congestion, sore throat, rhinorrhea, sneezing, mouth sores, trouble swallowing, neck pain, neck stiffness and tinnitus.   Respiratory: Denies SOB, DOE, cough, chest tightness,  and wheezing.   Cardiovascular: Denies chest pain, palpitations and leg swelling.  Gastrointestinal: Denies nausea, vomiting, abdominal pain, diarrhea, constipation, blood in stool and abdominal distention.  Genitourinary: Denies dysuria, urgency, frequency, hematuria, flank pain and difficulty urinating.  Endocrine: Denies: hot or cold intolerance, sweats, changes in hair or nails, polyuria, polydipsia. Musculoskeletal: Denies myalgias, back pain, joint swelling, arthralgias and gait problem.  Skin: Denies pallor, rash and wound.  Neurological: Denies dizziness, seizures, syncope, weakness, light-headedness, numbness and headaches.  Hematological: Denies adenopathy. Easy bruising, personal or family bleeding history  Psychiatric/Behavioral: Denies suicidal ideation, mood changes, confusion, nervousness, sleep disturbance and agitation   Physical Exam: Blood pressure 115/69, pulse 92, temperature 99.2 F (37.3 C), temperature source Oral, resp. rate 20, height 5' 11"  (1.803 m), weight 58.968 kg (130 lb), SpO2 100 %. General: Alert, awake, oriented 3, no distress, is cachectic and gives the appearance of somebody who is unkempt and chronically malnourished HEENT: Normocephalic, atraumatic, pupils equal round and  reactive to light extraocular movements intact, edentulous. Neck: Supple, no JVD, no lymphadenopathy, no bruits, no goiter. Cardiovascular: Regular rate and rhythm, no murmurs, rubs or gallops Lungs: Clear to auscultation bilaterally. Abdomen: Soft, nontender, nondistended, positive bowel sounds, no masses or organomegaly noted. Extremities: No clubbing, cyanosis or edema, positive pulses, severe contractures Neurologic: Grossly intact and nonfocal.  Labs on Admission:  Results for orders placed or performed during the hospital encounter of 05/04/2014 (from the past 48 hour(s))  CBC with Differential     Status: Abnormal   Collection Time: 05/12/2014 12:00 PM  Result Value Ref Range   WBC 4.9 4.0 - 10.5 K/uL   RBC 3.49 (L) 4.22 - 5.81 MIL/uL   Hemoglobin 12.0 (L) 13.0 - 17.0 g/dL   HCT 33.9 (L) 39.0 - 52.0 %   MCV 97.1 78.0 - 100.0 fL   MCH 34.4 (H) 26.0 - 34.0 pg   MCHC 35.4 30.0 - 36.0 g/dL   RDW 19.2 (H) 11.5 - 15.5 %   Platelets 248 150 - 400 K/uL   Neutrophils Relative % 38 (L) 43 - 77 %   Neutro Abs  1.9 1.7 - 7.7 K/uL   Lymphocytes Relative 50 (H) 12 - 46 %   Lymphs Abs 2.4 0.7 - 4.0 K/uL   Monocytes Relative 10 3 - 12 %   Monocytes Absolute 0.5 0.1 - 1.0 K/uL   Eosinophils Relative 1 0 - 5 %   Eosinophils Absolute 0.1 0.0 - 0.7 K/uL   Basophils Relative 1 0 - 1 %   Basophils Absolute 0.1 0.0 - 0.1 K/uL  Comprehensive metabolic panel     Status: Abnormal   Collection Time: 05/18/2014 12:00 PM  Result Value Ref Range   Sodium 137 135 - 145 mmol/L   Potassium 2.7 (LL) 3.5 - 5.1 mmol/L    Comment: REPEATED TO VERIFY CRITICAL RESULT CALLED TO, READ BACK BY AND VERIFIED WITH: KEITH,J AT 1306 ON 05/09/2014 BY MOSLEY, J    Chloride 96 (L) 101 - 111 mmol/L   CO2 28 22 - 32 mmol/L   Glucose, Bld 88 70 - 99 mg/dL   BUN 10 6 - 20 mg/dL   Creatinine, Ser 0.83 0.61 - 1.24 mg/dL   Calcium 7.7 (L) 8.9 - 10.3 mg/dL   Total Protein 6.9 6.5 - 8.1 g/dL   Albumin 2.9 (L) 3.5 - 5.0 g/dL   AST  20 15 - 41 U/L   ALT 7 (L) 17 - 63 U/L   Alkaline Phosphatase 146 (H) 38 - 126 U/L   Total Bilirubin 1.6 (H) 0.3 - 1.2 mg/dL   GFR calc non Af Amer >60 >60 mL/min   GFR calc Af Amer >60 >60 mL/min    Comment: (NOTE) The eGFR has been calculated using the CKD EPI equation. This calculation has not been validated in all clinical situations. eGFR's persistently <60 mL/min signify possible Chronic Kidney Disease.    Anion gap 13 5 - 15  Troponin I     Status: None   Collection Time: 05/20/2014 12:00 PM  Result Value Ref Range   Troponin I <0.03 <0.031 ng/mL    Comment:        NO INDICATION OF MYOCARDIAL INJURY.   Ethanol     Status: None   Collection Time: 05/26/2014 12:15 PM  Result Value Ref Range   Alcohol, Ethyl (B) <5 <5 mg/dL    Comment:        LOWEST DETECTABLE LIMIT FOR SERUM ALCOHOL IS 11 mg/dL FOR MEDICAL PURPOSES ONLY     Radiological Exams on Admission: Dg Chest 1 View  05/29/2014   CLINICAL DATA:  Loss of consciousness, pt stated he had a hard, dry cough  EXAM: CHEST  1 VIEW  COMPARISON:  01/20/2013  FINDINGS: There is bibasilar lung opacity. On the left, where it is more confluent, this is similar to the prior study. On the right, where it is more linear and reticular, this is improved from the prior exam. This is likely all atelectasis. Pneumonia is possible.  Cardiac silhouette is normal in size. No mediastinal or hilar masses. Bony thorax is diffusely demineralized there is all healed right clavicle fracture.  IMPRESSION: Lung base opacity, left greater than right. This is similar to the prior exam on the left and improved from the prior exam on the right. This is likely atelectasis. Pneumonia is possible.   Electronically Signed   By: Lajean Manes M.D.   On: 05/11/2014 12:44    Assessment/Plan Principal Problem:   Syncope Active Problems:   Orthostasis   ETOH abuse   Protein-calorie malnutrition, severe    Syncope -Secondary  to a combination of orthostasis  and alcohol abuse. -See below for details.  Orthostatic hypotension -Initial blood pressure was 80/50 and arrival to the ED. Has improved with IV fluids. -Agree with admission to continue IV fluids, check orthostatic vital signs in the morning.  Alcohol abuse -Thiamine/folate. -Start CIWA withdrawal protocol.  Hypokalemia -Replete IV and orally, check magnesium level  Severe protein caloric malnutrition -We'll request dietitian evaluation.  DVT prophylaxis -Subcutaneous heparin  CODE STATUS -Full code  Dr. Legrand Rams to assume care in the morning.    Time Spent on Admission: 85 minutes  [facility] Pager: [phone] 05/03/2014, 4:34 PM

## 2014-05-11 ENCOUNTER — Encounter (HOSPITAL_COMMUNITY): Payer: Self-pay

## 2014-05-11 ENCOUNTER — Observation Stay (HOSPITAL_COMMUNITY): Payer: Medicare Other

## 2014-05-11 DIAGNOSIS — I712 Thoracic aortic aneurysm, without rupture: Secondary | ICD-10-CM | POA: Diagnosis not present

## 2014-05-11 DIAGNOSIS — J90 Pleural effusion, not elsewhere classified: Secondary | ICD-10-CM | POA: Diagnosis not present

## 2014-05-11 DIAGNOSIS — J9811 Atelectasis: Secondary | ICD-10-CM | POA: Diagnosis not present

## 2014-05-11 DIAGNOSIS — R918 Other nonspecific abnormal finding of lung field: Secondary | ICD-10-CM | POA: Diagnosis not present

## 2014-05-11 LAB — COMPREHENSIVE METABOLIC PANEL
ALT: 5 U/L — ABNORMAL LOW (ref 17–63)
AST: 20 U/L (ref 15–41)
Albumin: 2.2 g/dL — ABNORMAL LOW (ref 3.5–5.0)
Alkaline Phosphatase: 113 U/L (ref 38–126)
Anion gap: 7 (ref 5–15)
BUN: 11 mg/dL (ref 6–20)
CALCIUM: 6.9 mg/dL — AB (ref 8.9–10.3)
CO2: 26 mmol/L (ref 22–32)
CREATININE: 0.73 mg/dL (ref 0.61–1.24)
Chloride: 106 mmol/L (ref 101–111)
GFR calc Af Amer: >60 mL/min (ref >60)
GFR calc non Af Amer: >60 mL/min (ref >60)
Glucose, Bld: 76 mg/dL (ref 70–99)
Potassium: 3.4 mmol/L — ABNORMAL LOW (ref 3.5–5.1)
SODIUM: 139 mmol/L (ref 135–145)
TOTAL PROTEIN: 5.3 g/dL — AB (ref 6.5–8.1)
Total Bilirubin: 0.9 mg/dL (ref 0.3–1.2)

## 2014-05-11 LAB — CBC
HEMATOCRIT: 28.2 % — AB (ref 39.0–52.0)
Hemoglobin: 10 g/dL — ABNORMAL LOW (ref 13.0–17.0)
MCH: 34.6 pg — ABNORMAL HIGH (ref 26.0–34.0)
MCHC: 35.5 g/dL (ref 30.0–36.0)
MCV: 97.6 fL (ref 78.0–100.0)
PLATELETS: 200 10*3/uL (ref 150–400)
RBC: 2.89 MIL/uL — ABNORMAL LOW (ref 4.22–5.81)
RDW: 19.3 % — ABNORMAL HIGH (ref 11.5–15.5)
WBC: 4.9 10*3/uL (ref 4.0–10.5)

## 2014-05-11 MED ORDER — MAGNESIUM OXIDE 400 (241.3 MG) MG PO TABS
400.0000 mg | ORAL_TABLET | Freq: Every day | ORAL | Status: DC
  Administered 2014-05-11 – 2014-05-14 (×4): 400 mg via ORAL
  Filled 2014-05-11 (×4): qty 1

## 2014-05-11 NOTE — Progress Notes (Signed)
Subjective: Patient was admitted yesterday due to loss of consciousness. He had hypotension. Patient is cathectic and severely malnourished. He lives with a friend and drinks alcohol daily. He is complaining of cough and congestion. His chest x-ray showed lung base opacity, atelectasis versus Pneumonia. No feve or chills.  Objective: Vital signs in last 24 hours: Temp:  [97.5 F (36.4 C)-99.2 F (37.3 C)] 97.5 F (36.4 C) (05/09 8563) Pulse Rate:  [61-120] 61 (05/09 0637) Resp:  [16-29] 20 (05/09 0637) BP: (82-115)/(52-81) 99/80 mmHg (05/09 0637) SpO2:  [95 %-100 %] 99 % (05/09 0637) Weight:  [58.968 kg (130 lb)] 58.968 kg (130 lb) (05/08 1604) Weight change:  Last BM Date: 05/09/14  Intake/Output from previous day: 05/08 0701 - 05/09 0700 In: 1801.7 [P.O.:560; I.V.:1241.7] Out: -   PHYSICAL EXAM General appearance: appears older than stated age, cachectic and slowed mentation Resp: diminished breath sounds bibasilar and rhonchi bibasilar Cardio: S1, S2 normal GI: soft, non-tender; bowel sounds normal; no masses,  no organomegaly Extremities: extremities normal, atraumatic, no cyanosis or edema  Lab Results:  Results for orders placed or performed during the hospital encounter of 05/15/2014 (from the past 48 hour(s))  CBC with Differential     Status: Abnormal   Collection Time: 05/28/2014 12:00 PM  Result Value Ref Range   WBC 4.9 4.0 - 10.5 K/uL   RBC 3.49 (L) 4.22 - 5.81 MIL/uL   Hemoglobin 12.0 (L) 13.0 - 17.0 g/dL   HCT 33.9 (L) 39.0 - 52.0 %   MCV 97.1 78.0 - 100.0 fL   MCH 34.4 (H) 26.0 - 34.0 pg   MCHC 35.4 30.0 - 36.0 g/dL   RDW 19.2 (H) 11.5 - 15.5 %   Platelets 248 150 - 400 K/uL   Neutrophils Relative % 38 (L) 43 - 77 %   Neutro Abs 1.9 1.7 - 7.7 K/uL   Lymphocytes Relative 50 (H) 12 - 46 %   Lymphs Abs 2.4 0.7 - 4.0 K/uL   Monocytes Relative 10 3 - 12 %   Monocytes Absolute 0.5 0.1 - 1.0 K/uL   Eosinophils Relative 1 0 - 5 %   Eosinophils Absolute 0.1 0.0 -  0.7 K/uL   Basophils Relative 1 0 - 1 %   Basophils Absolute 0.1 0.0 - 0.1 K/uL  Comprehensive metabolic panel     Status: Abnormal   Collection Time: 05/22/2014 12:00 PM  Result Value Ref Range   Sodium 137 135 - 145 mmol/L   Potassium 2.7 (LL) 3.5 - 5.1 mmol/L    Comment: REPEATED TO VERIFY CRITICAL RESULT CALLED TO, READ BACK BY AND VERIFIED WITH: KEITH,J AT 1306 ON 05/08/2014 BY MOSLEY, J    Chloride 96 (L) 101 - 111 mmol/L   CO2 28 22 - 32 mmol/L   Glucose, Bld 88 70 - 99 mg/dL   BUN 10 6 - 20 mg/dL   Creatinine, Ser 0.83 0.61 - 1.24 mg/dL   Calcium 7.7 (L) 8.9 - 10.3 mg/dL   Total Protein 6.9 6.5 - 8.1 g/dL   Albumin 2.9 (L) 3.5 - 5.0 g/dL   AST 20 15 - 41 U/L   ALT 7 (L) 17 - 63 U/L   Alkaline Phosphatase 146 (H) 38 - 126 U/L   Total Bilirubin 1.6 (H) 0.3 - 1.2 mg/dL   GFR calc non Af Amer >60 >60 mL/min   GFR calc Af Amer >60 >60 mL/min    Comment: (NOTE) The eGFR has been calculated using the CKD EPI  equation. This calculation has not been validated in all clinical situations. eGFR's persistently <60 mL/min signify possible Chronic Kidney Disease.    Anion gap 13 5 - 15  Troponin I     Status: None   Collection Time: 05/15/2014 12:00 PM  Result Value Ref Range   Troponin I <0.03 <0.031 ng/mL    Comment:        NO INDICATION OF MYOCARDIAL INJURY.   Magnesium     Status: Abnormal   Collection Time: 05/28/2014 12:00 PM  Result Value Ref Range   Magnesium 1.2 (L) 1.7 - 2.4 mg/dL  Ethanol     Status: None   Collection Time: 05/13/2014 12:15 PM  Result Value Ref Range   Alcohol, Ethyl (B) <5 <5 mg/dL    Comment:        LOWEST DETECTABLE LIMIT FOR SERUM ALCOHOL IS 11 mg/dL FOR MEDICAL PURPOSES ONLY   Comprehensive metabolic panel     Status: Abnormal   Collection Time: 05/11/14  5:52 AM  Result Value Ref Range   Sodium 139 135 - 145 mmol/L   Potassium 3.4 (L) 3.5 - 5.1 mmol/L    Comment: DELTA CHECK NOTED   Chloride 106 101 - 111 mmol/L   CO2 26 22 - 32 mmol/L    Glucose, Bld 76 70 - 99 mg/dL   BUN 11 6 - 20 mg/dL   Creatinine, Ser 0.73 0.61 - 1.24 mg/dL   Calcium 6.9 (L) 8.9 - 10.3 mg/dL   Total Protein 5.3 (L) 6.5 - 8.1 g/dL   Albumin 2.2 (L) 3.5 - 5.0 g/dL   AST 20 15 - 41 U/L   ALT 5 (L) 17 - 63 U/L   Alkaline Phosphatase 113 38 - 126 U/L   Total Bilirubin 0.9 0.3 - 1.2 mg/dL   GFR calc non Af Amer >60 >60 mL/min   GFR calc Af Amer >60 >60 mL/min    Comment: (NOTE) The eGFR has been calculated using the CKD EPI equation. This calculation has not been validated in all clinical situations. eGFR's persistently <60 mL/min signify possible Chronic Kidney Disease.    Anion gap 7 5 - 15  CBC     Status: Abnormal   Collection Time: 05/11/14  5:52 AM  Result Value Ref Range   WBC 4.9 4.0 - 10.5 K/uL   RBC 2.89 (L) 4.22 - 5.81 MIL/uL   Hemoglobin 10.0 (L) 13.0 - 17.0 g/dL   HCT 28.2 (L) 39.0 - 52.0 %   MCV 97.6 78.0 - 100.0 fL   MCH 34.6 (H) 26.0 - 34.0 pg   MCHC 35.5 30.0 - 36.0 g/dL   RDW 19.3 (H) 11.5 - 15.5 %   Platelets 200 150 - 400 K/uL    ABGS No results for input(s): PHART, PO2ART, TCO2, HCO3 in the last 72 hours.  Invalid input(s): PCO2 CULTURES No results found for this or any previous visit (from the past 240 hour(s)). Studies/Results: Dg Chest 1 View  05/13/2014   CLINICAL DATA:  Loss of consciousness, pt stated he had a hard, dry cough  EXAM: CHEST  1 VIEW  COMPARISON:  01/20/2013  FINDINGS: There is bibasilar lung opacity. On the left, where it is more confluent, this is similar to the prior study. On the right, where it is more linear and reticular, this is improved from the prior exam. This is likely all atelectasis. Pneumonia is possible.  Cardiac silhouette is normal in size. No mediastinal or hilar masses. Bony thorax is  diffusely demineralized there is all healed right clavicle fracture.  IMPRESSION: Lung base opacity, left greater than right. This is similar to the prior exam on the left and improved from the prior exam  on the right. This is likely atelectasis. Pneumonia is possible.   Electronically Signed   By: Lajean Manes M.D.   On: 05/19/2014 12:44    Medications: I have reviewed the patient's current medications.  Assesment:   Principal Problem:   Syncope Active Problems:   Orthostasis   ETOH abuse   Protein-calorie malnutrition, severe   Hypokalemia lung base opacity   Plan:  Medications reviewed Continue IV fluid and and K+ replacement CT of the chest Socila worker evaluation     Terecia Plaut 05/11/2014, 8:02 AM

## 2014-05-11 NOTE — Evaluation (Signed)
Physical Therapy Evaluation Patient Details Name: Jermaine [name] MRN: [id number] DOB: [birth year] Today's Date: 05/11/2014   History of Present Illness  Patient is an 81 year old with history significant for alcohol abuse and chronic pain that has left him wheelchair-bound is also a veteran presents to the hospital today with the above-mentioned complaints. He states that he drinks about half a pint a day and has not been eating or drinking much. When he gets out of bed and transfers into his wheelchair has been feeling very dizzy and today "passed out". He lives with a friend who called 82 who brought him to the hospital. He is found to have a systolic blood pressure in the 80s, appears cachectic and chronically malnourished, is edentulous and is very difficult to understand his speech. In the ED he is also found to have a potassium of 2.7 and we have been asked to admit him for further evaluation and management.  Clinical Impression  Pt is well known to this therapist from prior admissions.  He continues to appear very frail and emaciated, is w/c dependent.  He has no c/o today and states that he is ready to go to "the rest home".  He is independent in transfers bed to chair which is his baseline.  No further PT is indicated.    Follow Up Recommendations No PT follow up    Equipment Recommendations  None recommended by PT    Recommendations for Other Services   none    Precautions / Restrictions Precautions Precautions: Fall Restrictions Weight Bearing Restrictions: No      Mobility  Bed Mobility Overal bed mobility: Independent                Transfers Overall transfer level: Independent               General transfer comment: pt is able to independently transfer bed to w/c                                      Balance Overall balance assessment: Modified Independent (seated)                                            Pertinent Vitals/Pain Pain Assessment: No/denies pain    Home Living Family/patient expects to be discharged to:: Unsure                 Additional Comments: pt states that he now has no where to live and plans to go to a rest home...he has actually refused this in the past    Prior Function Level of Independence: Independent with assistive device(s)         Comments: independent from a w/c     Hand Dominance   Dominant Hand: Right    Extremity/Trunk Assessment   Upper Extremity Assessment: Generalized weakness           Lower Extremity Assessment: Generalized weakness      Cervical / Trunk Assessment: Kyphotic  Communication   Communication: No difficulties  Cognition Arousal/Alertness: Awake/alert Behavior During Therapy: WFL for tasks assessed/performed Overall Cognitive Status: Within Functional Limits for tasks assessed  Assessment/Plan    PT Assessment Patent does not need any further PT services  PT Diagnosis     PT Problem List    PT Treatment Interventions     PT Goals (Current goals can be found in the Care Plan section) Acute Rehab PT Goals PT Goal Formulation: All assessment and education complete, DC therapy         Barriers to discharge  he states that he has no home to go to                     End of Session Equipment Utilized During Treatment: Gait belt Activity Tolerance: Patient tolerated treatment well Patient left: in bed;with call bell/phone within reach;with bed alarm set           Time: 7195-9747 PT Time Calculation (min) (ACUTE ONLY): 13 min   Charges:   PT Evaluation $Initial PT Evaluation Tier I: 1 Procedure     PT G CodesDemetrios Isaacs L 05/11/2014, 3:30 PM

## 2014-05-11 NOTE — Clinical Social Work Placement (Signed)
   CLINICAL SOCIAL WORK PLACEMENT  NOTE  Date:  05/11/2014  Patient Details  Name: Jermaine [name] MRN: [id number] Date of Birth: 11-Jun-1931  Clinical Social Work is seeking post-discharge placement for this patient at the [facility] level of care (*CSW will initial, date and re-position this form in  chart as items are completed):  Yes   Patient/family provided with [facility] Work Department's list of facilities offering this level of care within the geographic area requested by the patient (or if unable, by the patient's family).  Yes   Patient/family informed of their freedom to choose among providers that offer the needed level of care, that participate in Medicare, Medicaid or managed care program needed by the patient, have an available bed and are willing to accept the patient.  Yes   Patient/family informed of [facility]'s ownership interest in [facility] and [facility], as well as of the fact that they are under no obligation to receive care at these facilities.  PASRR submitted to EDS on       PASRR number received on       Existing PASRR number confirmed on       FL2 transmitted to all facilities in geographic area requested by pt/family on 05/11/14     FL2 transmitted to all facilities within larger geographic area on       Patient informed that his/her managed care company has contracts with or will negotiate with certain facilities, including the following:            Patient/family informed of bed offers received.  Patient chooses bed at       Physician recommends and patient chooses bed at      Patient to be transferred to   on  .  Patient to be transferred to facility by       Patient family notified on   of transfer.  Name of family member notified:        PHYSICIAN       Additional Comment:   Pt private pay. No pasarr required.  _______________________________________________ Salome Arnt,  [place] 05/11/2014, 4:01 PM [phone]

## 2014-05-11 NOTE — Clinical Social Work Note (Signed)
Clinical Social Work Assessment  Patient Details  Name: Jermaine [name] MRN: [id number] Date of Birth: 11-16-1931  Date of referral:  05/11/14               Reason for consult:  Facility Placement, Substance Use/ETOH Abuse                Permission sought to share information with:    Permission granted to share information::     Name::        Agency::     Relationship::     Contact Information:     Housing/Transportation Living arrangements for the past 2 months:  Apartment Source of Information:  Patient Patient Interpreter Needed:  None Criminal Activity/Legal Involvement Pertinent to Current Situation/Hospitalization:  No - Comment as needed Significant Relationships:  None Lives with:  Friends Do you feel safe going back to the place where you live?  No (Pt no longer has a place to go.) Need for family participation in patient care:  No (Coment) (Pt reports no support system.)  Care giving concerns:  Pt has no place to go and no support system.    Social Worker assessment / plan:  CSW met with pt at bedside. Pt alert and oriented and well known to CSW from previous admissions. He is very irritable and becomes defensive with some normal assessment questions. Pt reports he has recently been living with a friend in an apartment. This ended on Sunday when they were "put out." Pt came to ED same day for cough and feeling dizzy. In observation due to syncope. Pt states he has no support from anyone. He does not know where he is going once he leaves hospital. CSW discussed ALF which has been brought up to pt several times in the past. He immediately said, "I am not giving up all my money that I worked my whole life for to go to a facility." Pt's income is above Medicaid limit and he is aware of this. CSW discussed that pt would not have as many needs if he was at facility. He agreed to $800 only. CSW explained that this may be very difficult to achieve and he states, "Then I'll find it  myself." CSW asked if he had already looked into this and he said, "I'm not giving away my secrets." He admits that he would enjoy social interaction at facility. CSW attempted to complete SBIRT, but pt was not very forthcoming about ETOH use. He did admit to drinking a half pint of vodka or gin a day, but said this is only because he does not have anyone to communicate with. He feels if he was at a facility this would not be a problem for him. CSW agreed to initiate bed search, but prepared pt that his payment may need to be reconsidered. Pt became upset again and CSW ended conversation. Per PT, pt is appropriate for ALF/FCH. He transfers to wheelchair at baseline.   Employment status:  Retired Nurse, adult PT Recommendations:  [address] / Referral to community resources:  Other (Comment Required) (ALF)  Patient/Family's Response to care:  Pt agreeable to ALF/FCH at d/c, but very emphatic that he will only spend $800 of his check towards placement.   Patient/Family's Understanding of and Emotional Response to Diagnosis, Current Treatment, and Prognosis:  This was not discussed with pt as he was very irritated with CSW regarding topics during visit.   Emotional Assessment Appearance:  Appears  stated age Attitude/Demeanor/Rapport:  Aggressive (Verbally and/or physically) (verbally) Affect (typically observed):  Defensive, Irritable Orientation:  Oriented to Self, Oriented to Place, Oriented to  Time, Oriented to Situation Alcohol / Substance use:    Psych involvement (Current and /or in the community):  No (Comment)  Discharge Needs  Concerns to be addressed:  Discharge Planning Concerns Readmission within the last 30 days:  No Current discharge risk:  Lack of support system, Homeless Barriers to Discharge:  Continued Medical Work up   [employer], [place] 05/11/2014, 4:06 PM [phone]

## 2014-05-12 ENCOUNTER — Observation Stay (HOSPITAL_COMMUNITY): Payer: Medicare Other

## 2014-05-12 DIAGNOSIS — E079 Disorder of thyroid, unspecified: Secondary | ICD-10-CM | POA: Diagnosis not present

## 2014-05-12 DIAGNOSIS — J690 Pneumonitis due to inhalation of food and vomit: Secondary | ICD-10-CM | POA: Diagnosis not present

## 2014-05-12 DIAGNOSIS — I951 Orthostatic hypotension: Secondary | ICD-10-CM | POA: Diagnosis present

## 2014-05-12 DIAGNOSIS — I213 ST elevation (STEMI) myocardial infarction of unspecified site: Secondary | ICD-10-CM | POA: Diagnosis not present

## 2014-05-12 DIAGNOSIS — E041 Nontoxic single thyroid nodule: Secondary | ICD-10-CM | POA: Diagnosis not present

## 2014-05-12 DIAGNOSIS — F1020 Alcohol dependence, uncomplicated: Secondary | ICD-10-CM | POA: Diagnosis present

## 2014-05-12 DIAGNOSIS — T68XXXA Hypothermia, initial encounter: Secondary | ICD-10-CM | POA: Diagnosis not present

## 2014-05-12 DIAGNOSIS — R05 Cough: Secondary | ICD-10-CM | POA: Diagnosis present

## 2014-05-12 DIAGNOSIS — Z86718 Personal history of other venous thrombosis and embolism: Secondary | ICD-10-CM | POA: Diagnosis not present

## 2014-05-12 DIAGNOSIS — I959 Hypotension, unspecified: Secondary | ICD-10-CM | POA: Diagnosis present

## 2014-05-12 DIAGNOSIS — R55 Syncope and collapse: Secondary | ICD-10-CM | POA: Diagnosis not present

## 2014-05-12 DIAGNOSIS — F17210 Nicotine dependence, cigarettes, uncomplicated: Secondary | ICD-10-CM | POA: Diagnosis present

## 2014-05-12 DIAGNOSIS — R911 Solitary pulmonary nodule: Secondary | ICD-10-CM | POA: Diagnosis present

## 2014-05-12 DIAGNOSIS — G8929 Other chronic pain: Secondary | ICD-10-CM | POA: Diagnosis present

## 2014-05-12 DIAGNOSIS — Z681 Body mass index (BMI) 19 or less, adult: Secondary | ICD-10-CM | POA: Diagnosis not present

## 2014-05-12 DIAGNOSIS — I214 Non-ST elevation (NSTEMI) myocardial infarction: Secondary | ICD-10-CM | POA: Diagnosis not present

## 2014-05-12 DIAGNOSIS — J189 Pneumonia, unspecified organism: Secondary | ICD-10-CM | POA: Diagnosis not present

## 2014-05-12 DIAGNOSIS — Z993 Dependence on wheelchair: Secondary | ICD-10-CM | POA: Diagnosis not present

## 2014-05-12 DIAGNOSIS — E43 Unspecified severe protein-calorie malnutrition: Secondary | ICD-10-CM | POA: Diagnosis not present

## 2014-05-12 DIAGNOSIS — J9811 Atelectasis: Secondary | ICD-10-CM | POA: Diagnosis present

## 2014-05-12 DIAGNOSIS — Z86711 Personal history of pulmonary embolism: Secondary | ICD-10-CM | POA: Diagnosis not present

## 2014-05-12 DIAGNOSIS — I469 Cardiac arrest, cause unspecified: Secondary | ICD-10-CM | POA: Diagnosis not present

## 2014-05-12 DIAGNOSIS — J449 Chronic obstructive pulmonary disease, unspecified: Secondary | ICD-10-CM | POA: Diagnosis present

## 2014-05-12 DIAGNOSIS — Z8546 Personal history of malignant neoplasm of prostate: Secondary | ICD-10-CM | POA: Diagnosis not present

## 2014-05-12 DIAGNOSIS — E042 Nontoxic multinodular goiter: Secondary | ICD-10-CM | POA: Diagnosis not present

## 2014-05-12 DIAGNOSIS — I499 Cardiac arrhythmia, unspecified: Secondary | ICD-10-CM | POA: Diagnosis not present

## 2014-05-12 DIAGNOSIS — Z96642 Presence of left artificial hip joint: Secondary | ICD-10-CM | POA: Diagnosis present

## 2014-05-12 DIAGNOSIS — E876 Hypokalemia: Secondary | ICD-10-CM | POA: Diagnosis present

## 2014-05-12 MED ORDER — DEXTROSE 5 % IV SOLN
1.0000 g | INTRAVENOUS | Status: DC
  Administered 2014-05-12 – 2014-05-14 (×3): 1 g via INTRAVENOUS
  Filled 2014-05-12 (×5): qty 10

## 2014-05-12 MED ORDER — POTASSIUM CHLORIDE 10 MEQ/100ML IV SOLN
10.0000 meq | INTRAVENOUS | Status: AC
  Administered 2014-05-12 (×3): 10 meq via INTRAVENOUS
  Filled 2014-05-12: qty 100

## 2014-05-12 MED ORDER — CEFTRIAXONE SODIUM IN DEXTROSE 20 MG/ML IV SOLN
1.0000 g | INTRAVENOUS | Status: DC
  Filled 2014-05-12 (×2): qty 50

## 2014-05-12 MED ORDER — DEXTROSE 5 % IV SOLN
500.0000 mg | INTRAVENOUS | Status: DC
  Administered 2014-05-12 – 2014-05-14 (×3): 500 mg via INTRAVENOUS
  Filled 2014-05-12 (×4): qty 500

## 2014-05-12 MED ORDER — METHYLPREDNISOLONE SODIUM SUCC 40 MG IJ SOLR
40.0000 mg | Freq: Two times a day (BID) | INTRAMUSCULAR | Status: DC
  Administered 2014-05-12 – 2014-05-14 (×5): 40 mg via INTRAVENOUS
  Filled 2014-05-12 (×5): qty 1

## 2014-05-12 NOTE — Consult Note (Signed)
Consult requested by: Dr .Legrand Rams Consult requested for abnormal chest CT:  HPI: This is an 81 year old who has multiple chronic medical problems including COPD alcohol abuse and chronic pain. He has been essentially wheelchair-bound at home. He presented to the emergency department after passing out at home. When he was seen in the emergency department he was malnourished hypokalemic and hypotensive. He underwent CT of his chest and this shows an extensive infiltrate as well as some other findings including thoracic aortic aneurysm and a lung nodule. He is high risk for aspiration because of alcoholism and his episodes of being unconscious. He says he feels okay. He says he is coughing. No hemoptysis. He denies chest pain  Past Medical History  Diagnosis Date  . COPD (chronic obstructive pulmonary disease)   . Chronic hip pain     Avascular necrosis, bilateral hips.  . DVT (deep venous thrombosis)   . PE (pulmonary embolism) May/2012.  Marland Kitchen Alcohol abuse   . Elevated PSA     Lost to followup.  . Macrocytic anemia   . Lytic lesion of bone on x-ray May/2012.    Negative bone scan.  . Bilateral pneumonia May/2012.  Marland Kitchen Thoracic compression fracture   . Tobacco abuse   . Alcohol withdrawal syndrome 06/10/2011  . Delirium, drug-induced 06/2011    From Ativan  . Abnormal thyroid function test 06/2011.    Normal TSH and slightly low free T4.  Marland Kitchen Hypoglycemia   . Dysphagia   . Syncope and collapse   . Hematuria   . Spinal stenosis   . Venous thrombosis   . 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 Chronic back pain   . Cancer     malignant neoplasm of prostate     Family History  Problem Relation Age of Onset  . Colon cancer Father      History   Social History  . Marital Status: Single    Spouse Name: N/A  . Number of Children: N/A  . Years of Education: N/A   Social History Main Topics  . Smoking status: Current Every Day Smoker -- 0.50 packs/day for 60 years    Types:  Cigarettes  . Smokeless tobacco: Never Used  . Alcohol Use: 24.0 oz/week    40 Shots of liquor per week     Comment: hx of ETOH abuse in past, none since September 2014  . Drug Use: No  . Sexual Activity: Not on file   Other Topics Concern  . None   Social History Narrative   Lives alone. 1 son, age 33.     ROS: No chest pain or hemoptysis. No definite fever.   Objective: Vital signs in last 24 hours: Temp:  [98 F (36.7 C)-98.2 F (36.8 C)] 98.1 F (36.7 C) (05/10 0531) Pulse Rate:  [63-81] 81 (05/10 0531) Resp:  [20] 20 (05/10 0531) BP: (98-123)/(65-78) 111/65 mmHg (05/10 0531) SpO2:  [98 %-100 %] 99 % (05/10 0531) Weight change:  Last BM Date: 05/11/14  Intake/Output from previous day: 05/09 0701 - 05/10 0700 In: 1468.3 [P.O.:480; I.V.:988.3] Out: 500 [Urine:500]  PHYSICAL EXAM His HEENT exam shows he is edentulous. He is very thin. His nose and throat are clear. His neck is supple. His chest shows diminished breath sounds and rhonchi bilaterally. Heart is regular without gallop. His abdomen is soft without masses. Extremities showed no edema  Lab Results: Basic Metabolic Panel:  Recent Labs  05/20/2014 1200 05/11/14 0552  NA 137  139  K 2.7* 3.4*  CL 96* 106  CO2 28 26  GLUCOSE 88 76  BUN 10 11  CREATININE 0.83 0.73  CALCIUM 7.7* 6.9*  MG 1.2*  --    Liver Function Tests:  Recent Labs  05/29/2014 1200 05/11/14 0552  AST 20 20  ALT 7* 5*  ALKPHOS 146* 113  BILITOT 1.6* 0.9  PROT 6.9 5.3*  ALBUMIN 2.9* 2.2*   No results for input(s): LIPASE, AMYLASE in the last 72 hours. No results for input(s): AMMONIA in the last 72 hours. CBC:  Recent Labs  05/27/2014 1200 05/11/14 0552  WBC 4.9 4.9  NEUTROABS 1.9  --   HGB 12.0* 10.0*  HCT 33.9* 28.2*  MCV 97.1 97.6  PLT 248 200   Cardiac Enzymes:  Recent Labs  05/16/2014 1200  TROPONINI <0.03   BNP: No results for input(s): PROBNP in the last 72 hours. D-Dimer: No results for input(s): DDIMER  in the last 72 hours. CBG: No results for input(s): GLUCAP in the last 72 hours. Hemoglobin A1C: No results for input(s): HGBA1C in the last 72 hours. Fasting Lipid Panel: No results for input(s): CHOL, HDL, LDLCALC, TRIG, CHOLHDL, LDLDIRECT in the last 72 hours. Thyroid Function Tests: No results for input(s): TSH, T4TOTAL, FREET4, T3FREE, THYROIDAB in the last 72 hours. Anemia Panel: No results for input(s): VITAMINB12, FOLATE, FERRITIN, TIBC, IRON, RETICCTPCT in the last 72 hours. Coagulation: No results for input(s): LABPROT, INR in the last 72 hours. Urine Drug Screen: Drugs of Abuse     Component Value Date/Time   LABOPIA NONE DETECTED 05/02/2012 1205   COCAINSCRNUR NONE DETECTED 05/02/2012 1205   LABBENZ NONE DETECTED 05/02/2012 1205   AMPHETMU NONE DETECTED 05/02/2012 1205   THCU NONE DETECTED 05/02/2012 1205   LABBARB NONE DETECTED 05/02/2012 1205    Alcohol Level:  Recent Labs  05/18/2014 1215  ETH <5   Urinalysis: No results for input(s): COLORURINE, LABSPEC, PHURINE, GLUCOSEU, HGBUR, BILIRUBINUR, KETONESUR, PROTEINUR, UROBILINOGEN, NITRITE, LEUKOCYTESUR in the last 72 hours.  Invalid input(s): APPERANCEUR Misc. Labs:   ABGS: No results for input(s): PHART, PO2ART, TCO2, HCO3 in the last 72 hours.  Invalid input(s): PCO2   MICROBIOLOGY: No results found for this or any previous visit (from the past 240 hour(s)).  Studies/Results: Dg Chest 1 View  05/31/2014   CLINICAL DATA:  Loss of consciousness, pt stated he had a hard, dry cough  EXAM: CHEST  1 VIEW  COMPARISON:  01/20/2013  FINDINGS: There is bibasilar lung opacity. On the left, where it is more confluent, this is similar to the prior study. On the right, where it is more linear and reticular, this is improved from the prior exam. This is likely all atelectasis. Pneumonia is possible.  Cardiac silhouette is normal in size. No mediastinal or hilar masses. Bony thorax is diffusely demineralized there is all  healed right clavicle fracture.  IMPRESSION: Lung base opacity, left greater than right. This is similar to the prior exam on the left and improved from the prior exam on the right. This is likely atelectasis. Pneumonia is possible.   Electronically Signed   By: Lajean Manes M.D.   On: 05/22/2014 12:44   Ct Chest Wo Contrast  05/11/2014   CLINICAL DATA:  Acute cough and chest pain.  EXAM: CT CHEST WITHOUT CONTRAST  TECHNIQUE: Multidetector CT imaging of the chest was performed following the standard protocol without IV contrast.  COMPARISON:  CT scan of May 15, 2012.  FINDINGS: No  pneumothorax is noted. Large airspace opacity is noted posteriorly in left lower lobe most consistent with pneumonia or possibly atelectasis. Minimal bilateral pleural effusions are noted with adjacent subsegmental atelectasis seen in the right lung base. New 3.2 mm nodule is noted laterally in left upper lobe best seen on image number 33 of series 3. 1.8 cm low density is noted in right thyroid lobe with associated calcification. This is stable compared to prior exam. 4.9 cm ascending thoracic aortic aneurysm is noted which is slightly enlarged compared to prior exam. Atherosclerotic calcifications of coronary arteries are noted. No mediastinal mass or adenopathy is noted. Atherosclerotic calcifications of thoracic aorta and great vessels are noted.  Multiple old compression fractures are noted throughout the thoracic spine. Most of these were present on prior exam. However there is new mild wedge compression deformity of T6 vertebral body with associated rounded sclerosis in its inferior portion. There also noted new rounded sclerotic densities in T7, T11 L1 and L2. This is concerning for possible metastatic disease.  IMPRESSION: Large left posterior basilar opacity is noted most consistent with pneumonia or possibly atelectasis.  Mild bilateral pleural effusions are noted with adjacent subsegmental atelectasis posteriorly in the  right lung base.  Stable 1.8 cm low density seen in right thyroid lobe with associated calcification. Thyroid ultrasound is recommended for further evaluation.  4.9 cm ascending thoracic aortic aneurysm is noted which is slightly enlarged compared to prior exam. Ascending thoracic aortic aneurysm. Recommend semi-annual imaging followup by CTA or MRA and referral to cardiothoracic surgery if not already obtained. This recommendation follows 2010 ACCF/AHA/AATS/ACR/ASA/SCA/SCAI/SIR/STS/SVM Guidelines for the Diagnosis and Management of Patients With Thoracic Aortic Disease. Circulation. 2010; 121: G182-X937.  New mild wedge compression deformity of T6 vertebral body is noted which appears to be subacute or chronic. Stable multiple other old compression fractures are noted.  Interval development of multiple sclerotic densities seen throughout the thoracic spine as described above. This is concerning for possible metastatic disease. Bone scan may be performed for further evaluation.  New 3.2 cm nodule is seen in left upper lobe. Given the above described findings, metastatic disease cannot be excluded, and followup CT scan in 3 months is recommended.   Electronically Signed   By: Marijo Conception, M.D.   On: 05/11/2014 09:28    Medications:  Prior to Admission:  Prescriptions prior to admission  Medication Sig Dispense Refill Last Dose  . PROAIR HFA 108 (90 BASE) MCG/ACT inhaler Inhale 2 puffs into the lungs every 6 (six) hours as needed for wheezing or shortness of breath.   0 05/09/2014 at Unknown time  . cephALEXin (KEFLEX) 500 MG capsule Take 1 capsule (500 mg total) by mouth 3 (three) times daily. (Patient not taking: Reported on 12/22/2013) 21 capsule 0 Completed Course at Unknown time  . traMADol (ULTRAM) 50 MG tablet Take 1 tablet (50 mg total) by mouth every 6 (six) hours as needed. (Patient not taking: Reported on 04/20/2014) 6 tablet 0 Completed Course at Unknown time  . traMADol (ULTRAM) 50 MG tablet  Take 1 tablet (50 mg total) by mouth every 6 (six) hours as needed. (Patient not taking: Reported on 04/20/2014) 15 tablet 0 Completed Course at Unknown time   Scheduled: . azithromycin  500 mg Intravenous Q24H  . cefTRIAXone (ROCEPHIN)  IV  1 g Intravenous Q24H  . folic acid  1 mg Oral Daily  . heparin  5,000 Units Subcutaneous 3 times per day  . LORazepam  0-4 mg Intravenous Q6H  Followed by  . LORazepam  0-4 mg Intravenous Q12H  . magnesium oxide  400 mg Oral Daily  . multivitamin with minerals  1 tablet Oral Daily  . potassium chloride  10 mEq Intravenous Q1 Hr x 3  . sodium chloride  3 mL Intravenous Q12H  . thiamine  100 mg Oral Daily   Or  . thiamine  100 mg Intravenous Daily   Continuous: . sodium chloride 100 mL/hr at 05/12/14 2482   NOI:[id number] **OR** acetaminophen, LORazepam **OR** LORazepam, ondansetron **OR** ondansetron (ZOFRAN) IV, senna-docusate  Assesment:He was admitted with syncope. He has a lung infiltrate that I think is likely aspiration pneumonia. It certainly could be something like a lung cancer as well. He has a history of alcohol abuse and has been unconscious so I think it's most likely aspiration. He has severe protein calorie malnutrition which influences his problems.  Principal Problem:   Syncope Active Problems:   Orthostasis   ETOH abuse   Protein-calorie malnutrition, severe   Hypokalemia    Plan:I agree with initiation of antibiotics. Steroid treatment is appropriate. He will need follow-up x-rays to make sure that he clears the infiltrate but that may take as long as 6 weeks     Wiktoria Hemrick L 05/12/2014, 8:39 AM

## 2014-05-12 NOTE — Progress Notes (Signed)
Subjective: Patient is resting. He is alert and awake. Patient claims he had cough over 3 weeks. No fever or chills. His chest x-ray and CT Scan is showing large opacity with pleural effusion.  Objective: Vital signs in last 24 hours: Temp:  [98 F (36.7 C)-98.2 F (36.8 C)] 98.1 F (36.7 C) (05/10 0531) Pulse Rate:  [63-81] 81 (05/10 0531) Resp:  [20] 20 (05/10 0531) BP: (98-123)/(65-78) 111/65 mmHg (05/10 0531) SpO2:  [98 %-100 %] 99 % (05/10 0531) Weight change:  Last BM Date: 05/11/14  Intake/Output from previous day: 05/09 0701 - 05/10 0700 In: 1468.3 [P.O.:480; I.V.:988.3] Out: 500 [Urine:500]  PHYSICAL EXAM General appearance: appears older than stated age, cachectic and slowed mentation Resp: diminished breath sounds bibasilar and rhonchi bibasilar Cardio: S1, S2 normal GI: soft, non-tender; bowel sounds normal; no masses,  no organomegaly Extremities: extremities normal, atraumatic, no cyanosis or edema  Lab Results:  Results for orders placed or performed during the hospital encounter of 06/02/2014 (from the past 48 hour(s))  CBC with Differential     Status: Abnormal   Collection Time: 05/09/2014 12:00 PM  Result Value Ref Range   WBC 4.9 4.0 - 10.5 K/uL   RBC 3.49 (L) 4.22 - 5.81 MIL/uL   Hemoglobin 12.0 (L) 13.0 - 17.0 g/dL   HCT 33.9 (L) 39.0 - 52.0 %   MCV 97.1 78.0 - 100.0 fL   MCH 34.4 (H) 26.0 - 34.0 pg   MCHC 35.4 30.0 - 36.0 g/dL   RDW 19.2 (H) 11.5 - 15.5 %   Platelets 248 150 - 400 K/uL   Neutrophils Relative % 38 (L) 43 - 77 %   Neutro Abs 1.9 1.7 - 7.7 K/uL   Lymphocytes Relative 50 (H) 12 - 46 %   Lymphs Abs 2.4 0.7 - 4.0 K/uL   Monocytes Relative 10 3 - 12 %   Monocytes Absolute 0.5 0.1 - 1.0 K/uL   Eosinophils Relative 1 0 - 5 %   Eosinophils Absolute 0.1 0.0 - 0.7 K/uL   Basophils Relative 1 0 - 1 %   Basophils Absolute 0.1 0.0 - 0.1 K/uL  Comprehensive metabolic panel     Status: Abnormal   Collection Time: 05/05/2014 12:00 PM  Result  Value Ref Range   Sodium 137 135 - 145 mmol/L   Potassium 2.7 (LL) 3.5 - 5.1 mmol/L    Comment: REPEATED TO VERIFY CRITICAL RESULT CALLED TO, READ BACK BY AND VERIFIED WITH: KEITH,J AT 1306 ON 05/17/2014 BY MOSLEY, J    Chloride 96 (L) 101 - 111 mmol/L   CO2 28 22 - 32 mmol/L   Glucose, Bld 88 70 - 99 mg/dL   BUN 10 6 - 20 mg/dL   Creatinine, Ser 0.83 0.61 - 1.24 mg/dL   Calcium 7.7 (L) 8.9 - 10.3 mg/dL   Total Protein 6.9 6.5 - 8.1 g/dL   Albumin 2.9 (L) 3.5 - 5.0 g/dL   AST 20 15 - 41 U/L   ALT 7 (L) 17 - 63 U/L   Alkaline Phosphatase 146 (H) 38 - 126 U/L   Total Bilirubin 1.6 (H) 0.3 - 1.2 mg/dL   GFR calc non Af Amer >60 >60 mL/min   GFR calc Af Amer >60 >60 mL/min    Comment: (NOTE) The eGFR has been calculated using the CKD EPI equation. This calculation has not been validated in all clinical situations. eGFR's persistently <60 mL/min signify possible Chronic Kidney Disease.    Anion gap 13 5 -  15  Troponin I     Status: None   Collection Time: 05/16/2014 12:00 PM  Result Value Ref Range   Troponin I <0.03 <0.031 ng/mL    Comment:        NO INDICATION OF MYOCARDIAL INJURY.   Magnesium     Status: Abnormal   Collection Time: 05/09/2014 12:00 PM  Result Value Ref Range   Magnesium 1.2 (L) 1.7 - 2.4 mg/dL  Ethanol     Status: None   Collection Time: 05/23/2014 12:15 PM  Result Value Ref Range   Alcohol, Ethyl (B) <5 <5 mg/dL    Comment:        LOWEST DETECTABLE LIMIT FOR SERUM ALCOHOL IS 11 mg/dL FOR MEDICAL PURPOSES ONLY   Comprehensive metabolic panel     Status: Abnormal   Collection Time: 05/11/14  5:52 AM  Result Value Ref Range   Sodium 139 135 - 145 mmol/L   Potassium 3.4 (L) 3.5 - 5.1 mmol/L    Comment: DELTA CHECK NOTED   Chloride 106 101 - 111 mmol/L   CO2 26 22 - 32 mmol/L   Glucose, Bld 76 70 - 99 mg/dL   BUN 11 6 - 20 mg/dL   Creatinine, Ser 0.73 0.61 - 1.24 mg/dL   Calcium 6.9 (L) 8.9 - 10.3 mg/dL   Total Protein 5.3 (L) 6.5 - 8.1 g/dL   Albumin  2.2 (L) 3.5 - 5.0 g/dL   AST 20 15 - 41 U/L   ALT 5 (L) 17 - 63 U/L   Alkaline Phosphatase 113 38 - 126 U/L   Total Bilirubin 0.9 0.3 - 1.2 mg/dL   GFR calc non Af Amer >60 >60 mL/min   GFR calc Af Amer >60 >60 mL/min    Comment: (NOTE) The eGFR has been calculated using the CKD EPI equation. This calculation has not been validated in all clinical situations. eGFR's persistently <60 mL/min signify possible Chronic Kidney Disease.    Anion gap 7 5 - 15  CBC     Status: Abnormal   Collection Time: 05/11/14  5:52 AM  Result Value Ref Range   WBC 4.9 4.0 - 10.5 K/uL   RBC 2.89 (L) 4.22 - 5.81 MIL/uL   Hemoglobin 10.0 (L) 13.0 - 17.0 g/dL   HCT 28.2 (L) 39.0 - 52.0 %   MCV 97.6 78.0 - 100.0 fL   MCH 34.6 (H) 26.0 - 34.0 pg   MCHC 35.5 30.0 - 36.0 g/dL   RDW 19.3 (H) 11.5 - 15.5 %   Platelets 200 150 - 400 K/uL    ABGS No results for input(s): PHART, PO2ART, TCO2, HCO3 in the last 72 hours.  Invalid input(s): PCO2 CULTURES No results found for this or any previous visit (from the past 240 hour(s)). Studies/Results: Dg Chest 1 View  05/24/2014   CLINICAL DATA:  Loss of consciousness, pt stated he had a hard, dry cough  EXAM: CHEST  1 VIEW  COMPARISON:  01/20/2013  FINDINGS: There is bibasilar lung opacity. On the left, where it is more confluent, this is similar to the prior study. On the right, where it is more linear and reticular, this is improved from the prior exam. This is likely all atelectasis. Pneumonia is possible.  Cardiac silhouette is normal in size. No mediastinal or hilar masses. Bony thorax is diffusely demineralized there is all healed right clavicle fracture.  IMPRESSION: Lung base opacity, left greater than right. This is similar to the prior exam on the left  and improved from the prior exam on the right. This is likely atelectasis. Pneumonia is possible.   Electronically Signed   By: Lajean Manes M.D.   On: 05/30/2014 12:44   Ct Chest Wo Contrast  05/11/2014    CLINICAL DATA:  Acute cough and chest pain.  EXAM: CT CHEST WITHOUT CONTRAST  TECHNIQUE: Multidetector CT imaging of the chest was performed following the standard protocol without IV contrast.  COMPARISON:  CT scan of May 15, 2012.  FINDINGS: No pneumothorax is noted. Large airspace opacity is noted posteriorly in left lower lobe most consistent with pneumonia or possibly atelectasis. Minimal bilateral pleural effusions are noted with adjacent subsegmental atelectasis seen in the right lung base. New 3.2 mm nodule is noted laterally in left upper lobe best seen on image number 33 of series 3. 1.8 cm low density is noted in right thyroid lobe with associated calcification. This is stable compared to prior exam. 4.9 cm ascending thoracic aortic aneurysm is noted which is slightly enlarged compared to prior exam. Atherosclerotic calcifications of coronary arteries are noted. No mediastinal mass or adenopathy is noted. Atherosclerotic calcifications of thoracic aorta and great vessels are noted.  Multiple old compression fractures are noted throughout the thoracic spine. Most of these were present on prior exam. However there is new mild wedge compression deformity of T6 vertebral body with associated rounded sclerosis in its inferior portion. There also noted new rounded sclerotic densities in T7, T11 L1 and L2. This is concerning for possible metastatic disease.  IMPRESSION: Large left posterior basilar opacity is noted most consistent with pneumonia or possibly atelectasis.  Mild bilateral pleural effusions are noted with adjacent subsegmental atelectasis posteriorly in the right lung base.  Stable 1.8 cm low density seen in right thyroid lobe with associated calcification. Thyroid ultrasound is recommended for further evaluation.  4.9 cm ascending thoracic aortic aneurysm is noted which is slightly enlarged compared to prior exam. Ascending thoracic aortic aneurysm. Recommend semi-annual imaging followup by CTA or  MRA and referral to cardiothoracic surgery if not already obtained. This recommendation follows 2010 ACCF/AHA/AATS/ACR/ASA/SCA/SCAI/SIR/STS/SVM Guidelines for the Diagnosis and Management of Patients With Thoracic Aortic Disease. Circulation. 2010; 121: H607-P710.  New mild wedge compression deformity of T6 vertebral body is noted which appears to be subacute or chronic. Stable multiple other old compression fractures are noted.  Interval development of multiple sclerotic densities seen throughout the thoracic spine as described above. This is concerning for possible metastatic disease. Bone scan may be performed for further evaluation.  New 3.2 cm nodule is seen in left upper lobe. Given the above described findings, metastatic disease cannot be excluded, and followup CT scan in 3 months is recommended.   Electronically Signed   By: Marijo Conception, M.D.   On: 05/11/2014 09:28    Medications: I have reviewed the patient's current medications.  Assesment:   Principal Problem:   Syncope Active Problems:   Orthostasis   ETOH abuse   Protein-calorie malnutrition, severe   Hypokalemia lung base opacity ? pneumonia  Plan:  Medications reviewed Will start treatment for possible pneumonia Pulmonary consult Will do thyroid ultrasound     Psalm Arman 05/12/2014, 8:15 AM

## 2014-05-12 NOTE — Clinical Social Work Note (Signed)
Pt was notified that [facility] at [facility] can accept pt for $1200 a month. Pt states he will consider but wants to talk to his cousins first. CSW will follow up in AM.    Benay Pike, [place]

## 2014-05-12 NOTE — Progress Notes (Signed)
Nurse called and said Pt was SOB. RT asked if Pt was on O2 and nurse said yes, RT entered the Pt's room and he was on RA and SATS were 100% and HR 78

## 2014-05-12 NOTE — Care Management Note (Signed)
Case Management Note  Patient Details  Name: Jermaine [name] MRN: [id number] Date of Birth: 11-13-1931  Expected Discharge Date:  05/12/14               Expected Discharge Plan:  Assisted Living / Rest Home  In-House Referral:  Clinical Social Work  Discharge planning Services  CM Consult  Post Acute Care Choice:  NA Choice offered to:  NA  DME Arranged:    DME Agency:     HH Arranged:    [place] Agency:     Status of Service:  Completed, signed off  Medicare Important Message Given:    Date Medicare IM Given:    Medicare IM give by:    Date Additional Medicare IM Given:    Additional Medicare Important Message give by:     If discussed at [place] of Stay Meetings, dates discussed:    Additional Comments: Pt admitted with syncopal episode. Pt previously lived in hotel but, per pt was kicked out prior to admission. CSW referred for ALF placement. PT recommends no F/U. No CM needs anticipated.   Sherald Barge, RN 05/12/2014, 1:51 PM

## 2014-05-13 ENCOUNTER — Inpatient Hospital Stay (HOSPITAL_COMMUNITY): Payer: Medicare Other

## 2014-05-13 LAB — BASIC METABOLIC PANEL
Anion gap: 8 (ref 5–15)
BUN: 10 mg/dL (ref 6–20)
CO2: 19 mmol/L — ABNORMAL LOW (ref 22–32)
Calcium: 7.5 mg/dL — ABNORMAL LOW (ref 8.9–10.3)
Chloride: 106 mmol/L (ref 101–111)
Creatinine, Ser: 0.71 mg/dL (ref 0.61–1.24)
GFR calc Af Amer: >60 mL/min (ref >60)
GFR calc non Af Amer: >60 mL/min (ref >60)
Glucose, Bld: 157 mg/dL — ABNORMAL HIGH (ref 70–99)
Potassium: 5.2 mmol/L — ABNORMAL HIGH (ref 3.5–5.1)
SODIUM: 133 mmol/L — AB (ref 135–145)

## 2014-05-13 LAB — TROPONIN I
Troponin I: 0.12 ng/mL — ABNORMAL HIGH (ref <0.031)
Troponin I: 0.14 ng/mL — ABNORMAL HIGH (ref <0.031)

## 2014-05-13 LAB — MRSA PCR SCREENING: MRSA by PCR: NEGATIVE

## 2014-05-13 LAB — CLOSTRIDIUM DIFFICILE BY PCR: Toxigenic C. Difficile by PCR: NEGATIVE

## 2014-05-13 MED ORDER — LIDOCAINE HCL (PF) 2 % IJ SOLN
INTRAMUSCULAR | Status: AC
  Filled 2014-05-13: qty 10

## 2014-05-13 MED ORDER — TUBERCULIN PPD 5 UNIT/0.1ML ID SOLN
5.0000 [IU] | Freq: Once | INTRADERMAL | Status: DC
  Administered 2014-05-13: 5 [IU] via INTRADERMAL
  Filled 2014-05-13: qty 0.1

## 2014-05-13 NOTE — Progress Notes (Signed)
Irregular hear trate note A-Fib. Notified Dr. Legrand Rams new orders EKG and cardiac enzymes. Patient c/o back pain. Patient anxious will not keep bed covers on.

## 2014-05-13 NOTE — Progress Notes (Signed)
He has elevated troponin and some differences in EKG.He is not a good candidate for cath.  However will move to stepdown. Cycle troponins.

## 2014-05-13 NOTE — Clinical Social Work Note (Signed)
Pt requested that CSW call several more facilities, particularly [facility] and [facility]. Neither facility can accept pt. He was notified that only option at this point is [facility].   Benay Pike, [place]

## 2014-05-13 NOTE — Progress Notes (Signed)
Patient has not voided all night.  Bladder scanned patient, only 60cc of fluid showing in bladder scan.  Patient has had poor po intake during the night and encouraged patient to void. Patient states he only voids once a day,and he will use the bathroom when he is ready.

## 2014-05-13 NOTE — Progress Notes (Signed)
Notified Dr. Luan Pulling pt's troponins were elevated at 0.12. Awaiting response.

## 2014-05-13 NOTE — Progress Notes (Signed)
Subjective: Patient is started on IV antibiotics. He was evaluated by pulmonary. He has recurrent cough. No fever, chills or chest pain. His K+ is controlled. Objective: Vital signs in last 24 hours: Temp:  [98.2 F (36.8 C)-98.6 F (37 C)] 98.5 F (36.9 C) (05/11 0438) Pulse Rate:  [85-103] 103 (05/11 0438) Resp:  [18-20] 19 (05/11 0438) BP: (99-120)/(69-86) 120/77 mmHg (05/11 0438) SpO2:  [99 %-100 %] 100 % (05/11 0438) Weight change:  Last BM Date: 05/11/14  Intake/Output from previous day: 05/10 0701 - 05/11 0700 In: 1620 [P.O.:720; I.V.:900] Out: 100 [Urine:100]  PHYSICAL EXAM General appearance: appears older than stated age, cachectic and slowed mentation Resp: diminished breath sounds bibasilar and rhonchi bibasilar Cardio: S1, S2 normal GI: soft, non-tender; bowel sounds normal; no masses,  no organomegaly Extremities: extremities normal, atraumatic, no cyanosis or edema  Lab Results:  Results for orders placed or performed during the hospital encounter of 05/27/2014 (from the past 48 hour(s))  Basic metabolic panel     Status: Abnormal   Collection Time: 05/13/14  5:19 AM  Result Value Ref Range   Sodium 133 (L) 135 - 145 mmol/L   Potassium 5.2 (H) 3.5 - 5.1 mmol/L    Comment: DELTA CHECK NOTED   Chloride 106 101 - 111 mmol/L   CO2 19 (L) 22 - 32 mmol/L   Glucose, Bld 157 (H) 70 - 99 mg/dL   BUN 10 6 - 20 mg/dL   Creatinine, Ser 0.71 0.61 - 1.24 mg/dL   Calcium 7.5 (L) 8.9 - 10.3 mg/dL   GFR calc non Af Amer >60 >60 mL/min   GFR calc Af Amer >60 >60 mL/min    Comment: (NOTE) The eGFR has been calculated using the CKD EPI equation. This calculation has not been validated in all clinical situations. eGFR's persistently <60 mL/min signify possible Chronic Kidney Disease.    Anion gap 8 5 - 15    ABGS No results for input(s): PHART, PO2ART, TCO2, HCO3 in the last 72 hours.  Invalid input(s): PCO2 CULTURES No results found for this or any previous visit  (from the past 240 hour(s)). Studies/Results: Ct Chest Wo Contrast  05/11/2014   CLINICAL DATA:  Acute cough and chest pain.  EXAM: CT CHEST WITHOUT CONTRAST  TECHNIQUE: Multidetector CT imaging of the chest was performed following the standard protocol without IV contrast.  COMPARISON:  CT scan of May 15, 2012.  FINDINGS: No pneumothorax is noted. Large airspace opacity is noted posteriorly in left lower lobe most consistent with pneumonia or possibly atelectasis. Minimal bilateral pleural effusions are noted with adjacent subsegmental atelectasis seen in the right lung base. New 3.2 mm nodule is noted laterally in left upper lobe best seen on image number 33 of series 3. 1.8 cm low density is noted in right thyroid lobe with associated calcification. This is stable compared to prior exam. 4.9 cm ascending thoracic aortic aneurysm is noted which is slightly enlarged compared to prior exam. Atherosclerotic calcifications of coronary arteries are noted. No mediastinal mass or adenopathy is noted. Atherosclerotic calcifications of thoracic aorta and great vessels are noted.  Multiple old compression fractures are noted throughout the thoracic spine. Most of these were present on prior exam. However there is new mild wedge compression deformity of T6 vertebral body with associated rounded sclerosis in its inferior portion. There also noted new rounded sclerotic densities in T7, T11 L1 and L2. This is concerning for possible metastatic disease.  IMPRESSION: Large left posterior basilar opacity  is noted most consistent with pneumonia or possibly atelectasis.  Mild bilateral pleural effusions are noted with adjacent subsegmental atelectasis posteriorly in the right lung base.  Stable 1.8 cm low density seen in right thyroid lobe with associated calcification. Thyroid ultrasound is recommended for further evaluation.  4.9 cm ascending thoracic aortic aneurysm is noted which is slightly enlarged compared to prior exam.  Ascending thoracic aortic aneurysm. Recommend semi-annual imaging followup by CTA or MRA and referral to cardiothoracic surgery if not already obtained. This recommendation follows 2010 ACCF/AHA/AATS/ACR/ASA/SCA/SCAI/SIR/STS/SVM Guidelines for the Diagnosis and Management of Patients With Thoracic Aortic Disease. Circulation. 2010; 121: N003-B048.  New mild wedge compression deformity of T6 vertebral body is noted which appears to be subacute or chronic. Stable multiple other old compression fractures are noted.  Interval development of multiple sclerotic densities seen throughout the thoracic spine as described above. This is concerning for possible metastatic disease. Bone scan may be performed for further evaluation.  New 3.2 cm nodule is seen in left upper lobe. Given the above described findings, metastatic disease cannot be excluded, and followup CT scan in 3 months is recommended.   Electronically Signed   By: Marijo Conception, M.D.   On: 05/11/2014 09:28   US Soft Tissue Head/neck  05/12/2014   CLINICAL DATA:  81 year old male with a history of right thyroid nodule. Nodule identified on prior chest CT.  EXAM: THYROID ULTRASOUND  TECHNIQUE: Ultrasound examination of the thyroid gland and adjacent soft tissues was performed.  COMPARISON:  Chest CT 05/11/2014  FINDINGS: Right thyroid lobe  Measurements: 5.4 cm x 2.3 cm x 3.1 cm. Dominant right-sided nodule measures 3.0 cm x 1.9 cm x 2.2 cm. Internal reflectors compatible with calcifications.  Left thyroid lobe  Measurements: 5.3 cm x 2.6 cm by 2.2 cm. Single left-sided nodule measures 9 mm.  Isthmus  Thickness: 4 mm.  No nodules visualized.  Lymphadenopathy  None visualized.  IMPRESSION: Multinodular thyroid. Dominant right-sided nodule meets criteria for biopsy. Ultrasound-guided fine needle aspiration should be considered, as per the consensus statement: Management of Thyroid Nodules Detected at [place]: Society of Radiologists in [place]. Radiology 2005; [id number].  Signed,  Dulcy Fanny. Earleen Newport, DO  [facility]  [facility]   Electronically Signed   By: Corrie Mckusick D.O.   On: 05/12/2014 13:03    Medications: I have reviewed the patient's current medications.  Assesment:   Principal Problem:   Syncope Active Problems:   Orthostasis   ETOH abuse   Protein-calorie malnutrition, severe   Hypokalemia lung base opacity ? pneumonia  Plan:  Medications reviewed Will start treatment for possible pneumonia Pulmonary consult appreciated Will do Biopsy of thyroid nodule   LOS: 1 day   Mika Griffitts 05/13/2014, 8:09 AM

## 2014-05-13 NOTE — Progress Notes (Signed)
Subjective: He is more confused today. He is still coughing and congested  Objective: Vital signs in last 24 hours: Temp:  [98.2 F (36.8 C)-98.6 F (37 C)] 98.5 F (36.9 C) (05/11 0438) Pulse Rate:  [85-103] 103 (05/11 0438) Resp:  [18-20] 19 (05/11 0438) BP: (99-120)/(69-86) 120/77 mmHg (05/11 0438) SpO2:  [99 %-100 %] 100 % (05/11 0438) Weight change:  Last BM Date: 05/11/14  Intake/Output from previous day: 05/10 0701 - 05/11 0700 In: 1620 [P.O.:720; I.V.:900] Out: 100 [Urine:100]  PHYSICAL EXAM General appearance: alert, cooperative and mild distress Resp: rhonchi bilaterally Cardio: regular rate and rhythm, S1, S2 normal, no murmur, click, rub or gallop GI: soft, non-tender; bowel sounds normal; no masses,  no organomegaly Extremities: extremities normal, atraumatic, no cyanosis or edema  Lab Results:  Results for orders placed or performed during the hospital encounter of 05/15/2014 (from the past 48 hour(s))  Basic metabolic panel     Status: Abnormal   Collection Time: 05/13/14  5:19 AM  Result Value Ref Range   Sodium 133 (L) 135 - 145 mmol/L   Potassium 5.2 (H) 3.5 - 5.1 mmol/L    Comment: DELTA CHECK NOTED   Chloride 106 101 - 111 mmol/L   CO2 19 (L) 22 - 32 mmol/L   Glucose, Bld 157 (H) 70 - 99 mg/dL   BUN 10 6 - 20 mg/dL   Creatinine, Ser 0.71 0.61 - 1.24 mg/dL   Calcium 7.5 (L) 8.9 - 10.3 mg/dL   GFR calc non Af Amer >60 >60 mL/min   GFR calc Af Amer >60 >60 mL/min    Comment: (NOTE) The eGFR has been calculated using the CKD EPI equation. This calculation has not been validated in all clinical situations. eGFR's persistently <60 mL/min signify possible Chronic Kidney Disease.    Anion gap 8 5 - 15    ABGS No results for input(s): PHART, PO2ART, TCO2, HCO3 in the last 72 hours.  Invalid input(s): PCO2 CULTURES No results found for this or any previous visit (from the past 240 hour(s)). Studies/Results: Ct Chest Wo Contrast  05/11/2014    CLINICAL DATA:  Acute cough and chest pain.  EXAM: CT CHEST WITHOUT CONTRAST  TECHNIQUE: Multidetector CT imaging of the chest was performed following the standard protocol without IV contrast.  COMPARISON:  CT scan of May 15, 2012.  FINDINGS: No pneumothorax is noted. Large airspace opacity is noted posteriorly in left lower lobe most consistent with pneumonia or possibly atelectasis. Minimal bilateral pleural effusions are noted with adjacent subsegmental atelectasis seen in the right lung base. New 3.2 mm nodule is noted laterally in left upper lobe best seen on image number 33 of series 3. 1.8 cm low density is noted in right thyroid lobe with associated calcification. This is stable compared to prior exam. 4.9 cm ascending thoracic aortic aneurysm is noted which is slightly enlarged compared to prior exam. Atherosclerotic calcifications of coronary arteries are noted. No mediastinal mass or adenopathy is noted. Atherosclerotic calcifications of thoracic aorta and great vessels are noted.  Multiple old compression fractures are noted throughout the thoracic spine. Most of these were present on prior exam. However there is new mild wedge compression deformity of T6 vertebral body with associated rounded sclerosis in its inferior portion. There also noted new rounded sclerotic densities in T7, T11 L1 and L2. This is concerning for possible metastatic disease.  IMPRESSION: Large left posterior basilar opacity is noted most consistent with pneumonia or possibly atelectasis.  Mild bilateral  pleural effusions are noted with adjacent subsegmental atelectasis posteriorly in the right lung base.  Stable 1.8 cm low density seen in right thyroid lobe with associated calcification. Thyroid ultrasound is recommended for further evaluation.  4.9 cm ascending thoracic aortic aneurysm is noted which is slightly enlarged compared to prior exam. Ascending thoracic aortic aneurysm. Recommend semi-annual imaging followup by CTA or  MRA and referral to cardiothoracic surgery if not already obtained. This recommendation follows 2010 ACCF/AHA/AATS/ACR/ASA/SCA/SCAI/SIR/STS/SVM Guidelines for the Diagnosis and Management of Patients With Thoracic Aortic Disease. Circulation. 2010; 121: T218-C883.  New mild wedge compression deformity of T6 vertebral body is noted which appears to be subacute or chronic. Stable multiple other old compression fractures are noted.  Interval development of multiple sclerotic densities seen throughout the thoracic spine as described above. This is concerning for possible metastatic disease. Bone scan may be performed for further evaluation.  New 3.2 cm nodule is seen in left upper lobe. Given the above described findings, metastatic disease cannot be excluded, and followup CT scan in 3 months is recommended.   Electronically Signed   By: Marijo Conception, M.D.   On: 05/11/2014 09:28   US Soft Tissue Head/neck  05/12/2014   CLINICAL DATA:  81 year old male with a history of right thyroid nodule. Nodule identified on prior chest CT.  EXAM: THYROID ULTRASOUND  TECHNIQUE: Ultrasound examination of the thyroid gland and adjacent soft tissues was performed.  COMPARISON:  Chest CT 05/11/2014  FINDINGS: Right thyroid lobe  Measurements: 5.4 cm x 2.3 cm x 3.1 cm. Dominant right-sided nodule measures 3.0 cm x 1.9 cm x 2.2 cm. Internal reflectors compatible with calcifications.  Left thyroid lobe  Measurements: 5.3 cm x 2.6 cm by 2.2 cm. Single left-sided nodule measures 9 mm.  Isthmus  Thickness: 4 mm.  No nodules visualized.  Lymphadenopathy  None visualized.  IMPRESSION: Multinodular thyroid. Dominant right-sided nodule meets criteria for biopsy. Ultrasound-guided fine needle aspiration should be considered, as per the consensus statement: Management of Thyroid Nodules Detected at [place]: Society of Radiologists in [place]. Radiology 2005; [id number].  Signed,  Dulcy Fanny. Earleen Newport, DO  [facility]  [facility]   Electronically Signed   By: Corrie Mckusick D.O.   On: 05/12/2014 13:03    Medications:  Prior to Admission:  Prescriptions prior to admission  Medication Sig Dispense Refill Last Dose  . PROAIR HFA 108 (90 BASE) MCG/ACT inhaler Inhale 2 puffs into the lungs every 6 (six) hours as needed for wheezing or shortness of breath.   0 05/09/2014 at Unknown time  . cephALEXin (KEFLEX) 500 MG capsule Take 1 capsule (500 mg total) by mouth 3 (three) times daily. (Patient not taking: Reported on 12/22/2013) 21 capsule 0 Completed Course at Unknown time  . traMADol (ULTRAM) 50 MG tablet Take 1 tablet (50 mg total) by mouth every 6 (six) hours as needed. (Patient not taking: Reported on 04/20/2014) 6 tablet 0 Completed Course at Unknown time  . traMADol (ULTRAM) 50 MG tablet Take 1 tablet (50 mg total) by mouth every 6 (six) hours as needed. (Patient not taking: Reported on 04/20/2014) 15 tablet 0 Completed Course at Unknown time   Scheduled: . azithromycin  500 mg Intravenous Q24H  . cefTRIAXone (ROCEPHIN)  IV  1 g Intravenous Q24H  . folic acid  1 mg Oral Daily  . heparin  5,000 Units Subcutaneous 3 times per day  . LORazepam  0-4 mg Intravenous Q12H  .  magnesium oxide  400 mg Oral Daily  . methylPREDNISolone (SOLU-MEDROL) injection  40 mg Intravenous Q12H  . multivitamin with minerals  1 tablet Oral Daily  . sodium chloride  3 mL Intravenous Q12H  . thiamine  100 mg Oral Daily   Or  . thiamine  100 mg Intravenous Daily  . tuberculin  5 Units Intradermal Once   Continuous: . sodium chloride 100 mL/hr at 05/12/14 2179   GVS:[id number] **OR** acetaminophen, LORazepam **OR** LORazepam, ondansetron **OR** ondansetron (ZOFRAN) IV, senna-docusate  Assesment: He has pneumonia and is being treated appropriately for that. He has more confusion which may be related to his chronic alcohol abuse. I believe his pneumonia is likely aspiration.  He  has protein calorie malnutrition which is severe and complicates his course. Principal Problem:   Syncope Active Problems:   Orthostasis   ETOH abuse   Protein-calorie malnutrition, severe   Hypokalemia    Plan: Continue current antibiotics.    LOS: 1 day   Maxten Shuler L 05/13/2014, 8:33 AM

## 2014-05-13 NOTE — Progress Notes (Signed)
Pt transferred down to ICU bed 10. Pt alert and vitals noted. Pt on tele. Nurse given report.

## 2014-05-14 DIAGNOSIS — I469 Cardiac arrest, cause unspecified: Secondary | ICD-10-CM

## 2014-05-14 LAB — GLUCOSE, CAPILLARY: Glucose-Capillary: 100 mg/dL — ABNORMAL HIGH (ref 65–99)

## 2014-05-14 LAB — TROPONIN I: Troponin I: 0.25 ng/mL — ABNORMAL HIGH (ref <0.031)

## 2014-05-14 MED ORDER — HEPARIN (PORCINE) IN NACL 100-0.45 UNIT/ML-% IJ SOLN
650.0000 [IU]/h | INTRAMUSCULAR | Status: DC
  Administered 2014-05-14: 650 [IU]/h via INTRAVENOUS
  Filled 2014-05-14: qty 250

## 2014-05-14 MED ORDER — LORAZEPAM 2 MG/ML IJ SOLN
0.5000 mg | INTRAMUSCULAR | Status: DC | PRN
  Administered 2014-05-14: 0.5 mg via INTRAVENOUS
  Filled 2014-05-14: qty 1

## 2014-05-14 MED ORDER — HEPARIN BOLUS VIA INFUSION
2000.0000 [IU] | Freq: Once | INTRAVENOUS | Status: AC
  Administered 2014-05-14: 2000 [IU] via INTRAVENOUS
  Filled 2014-05-14: qty 2000

## 2014-05-14 MED ORDER — CHLORHEXIDINE GLUCONATE 0.12 % MT SOLN
15.0000 mL | Freq: Two times a day (BID) | OROMUCOSAL | Status: DC
  Administered 2014-05-14: 15 mL via OROMUCOSAL
  Filled 2014-05-14: qty 15

## 2014-05-14 MED ORDER — CETYLPYRIDINIUM CHLORIDE 0.05 % MT LIQD
7.0000 mL | Freq: Two times a day (BID) | OROMUCOSAL | Status: DC
  Administered 2014-05-14: 7 mL via OROMUCOSAL

## 2014-05-19 MED FILL — Medication: Qty: 1 | Status: AC

## 2014-05-25 NOTE — Discharge Summary (Signed)
Physician Discharge Summary  Patient ID: Jermaine [name] MRN: [id number] DOB/AGE: 1931/06/29 81 y.o. Primary Care Physician:Keionna Kinnaird, MD Admit date: 05/11/2014 Discharge date: 05-16-14   Final diagnosis   Principal Problem:   Syncope Active Problems:   Orthostasis   ETOH abuse   Protein-calorie malnutrition, severe   Hypokalemia Pneumonia Left Lung atelectasis tachyarrhythmia Elevated  Troponin Thyroid nodule 4.9 cm aortic aneurysm Lung nodule     Discharged Condition: EXpired    Consults: Pulmonary  Significant Diagnostic Studies: Dg Chest 1 View  05/15/2014   CLINICAL DATA:  Loss of consciousness, pt stated he had a hard, dry cough  EXAM: CHEST  1 VIEW  COMPARISON:  01/20/2013  FINDINGS: There is bibasilar lung opacity. On the left, where it is more confluent, this is similar to the prior study. On the right, where it is more linear and reticular, this is improved from the prior exam. This is likely all atelectasis. Pneumonia is possible.  Cardiac silhouette is normal in size. No mediastinal or hilar masses. Bony thorax is diffusely demineralized there is all healed right clavicle fracture.  IMPRESSION: Lung base opacity, left greater than right. This is similar to the prior exam on the left and improved from the prior exam on the right. This is likely atelectasis. Pneumonia is possible.   Electronically Signed   By: Lajean Manes M.D.   On: 05/23/2014 12:44   Ct Chest Wo Contrast  05/13/2014   ADDENDUM REPORT: 05/13/2014 13:24  ADDENDUM: CORRECTION: The first sentence in the last paragraph of the impression should read "New 3.2 MM nodule is seen in the left upper lobe." The nodule is not 3.2 CM in diameter.   Electronically Signed   By: Lorriane Shire M.D.   On: 05/13/2014 13:24   05/13/2014   CLINICAL DATA:  Acute cough and chest pain.  EXAM: CT CHEST WITHOUT CONTRAST  TECHNIQUE: Multidetector CT imaging of the chest was performed following the standard protocol without  IV contrast.  COMPARISON:  CT scan of May 15, 2012.  FINDINGS: No pneumothorax is noted. Large airspace opacity is noted posteriorly in left lower lobe most consistent with pneumonia or possibly atelectasis. Minimal bilateral pleural effusions are noted with adjacent subsegmental atelectasis seen in the right lung base. New 3.2 mm nodule is noted laterally in left upper lobe best seen on image number 33 of series 3. 1.8 cm low density is noted in right thyroid lobe with associated calcification. This is stable compared to prior exam. 4.9 cm ascending thoracic aortic aneurysm is noted which is slightly enlarged compared to prior exam. Atherosclerotic calcifications of coronary arteries are noted. No mediastinal mass or adenopathy is noted. Atherosclerotic calcifications of thoracic aorta and great vessels are noted.  Multiple old compression fractures are noted throughout the thoracic spine. Most of these were present on prior exam. However there is new mild wedge compression deformity of T6 vertebral body with associated rounded sclerosis in its inferior portion. There also noted new rounded sclerotic densities in T7, T11 L1 and L2. This is concerning for possible metastatic disease.  IMPRESSION: Large left posterior basilar opacity is noted most consistent with pneumonia or possibly atelectasis.  Mild bilateral pleural effusions are noted with adjacent subsegmental atelectasis posteriorly in the right lung base.  Stable 1.8 cm low density seen in right thyroid lobe with associated calcification. Thyroid ultrasound is recommended for further evaluation.  4.9 cm ascending thoracic aortic aneurysm is noted which is slightly enlarged compared to prior exam. Ascending thoracic  aortic aneurysm. Recommend semi-annual imaging followup by CTA or MRA and referral to cardiothoracic surgery if not already obtained. This recommendation follows 2010 ACCF/AHA/AATS/ACR/ASA/SCA/SCAI/SIR/STS/SVM Guidelines for the Diagnosis and  Management of Patients With Thoracic Aortic Disease. Circulation. 2010; 121: X323-F573.  New mild wedge compression deformity of T6 vertebral body is noted which appears to be subacute or chronic. Stable multiple other old compression fractures are noted.  Interval development of multiple sclerotic densities seen throughout the thoracic spine as described above. This is concerning for possible metastatic disease. Bone scan may be performed for further evaluation.  New 3.2 cm nodule is seen in left upper lobe. Given the above described findings, metastatic disease cannot be excluded, and followup CT scan in 3 months is recommended.  Electronically Signed: By: Marijo Conception, M.D. On: 05/11/2014 09:28   US Soft Tissue Head/neck  05/12/2014   CLINICAL DATA:  81 year old male with a history of right thyroid nodule. Nodule identified on prior chest CT.  EXAM: THYROID ULTRASOUND  TECHNIQUE: Ultrasound examination of the thyroid gland and adjacent soft tissues was performed.  COMPARISON:  Chest CT 05/11/2014  FINDINGS: Right thyroid lobe  Measurements: 5.4 cm x 2.3 cm x 3.1 cm. Dominant right-sided nodule measures 3.0 cm x 1.9 cm x 2.2 cm. Internal reflectors compatible with calcifications.  Left thyroid lobe  Measurements: 5.3 cm x 2.6 cm by 2.2 cm. Single left-sided nodule measures 9 mm.  Isthmus  Thickness: 4 mm.  No nodules visualized.  Lymphadenopathy  None visualized.  IMPRESSION: Multinodular thyroid. Dominant right-sided nodule meets criteria for biopsy. Ultrasound-guided fine needle aspiration should be considered, as per the consensus statement: Management of Thyroid Nodules Detected at [place]: Society of Radiologists in [place]. Radiology 2005; [id number].  Signed,  Dulcy Fanny. Earleen Newport, DO  [facility]  [facility]   Electronically Signed   By: Corrie Mckusick D.O.   On: 05/12/2014 13:03   US Thyroid Biopsy  05/13/2014   CLINICAL  DATA:  Thyroid nodule.  EXAM: ULTRASOUND GUIDED NEEDLE ASPIRATE BIOPSY OF THE THYROID GLAND  COMPARISON:  None.  PROCEDURE: Thyroid biopsy was thoroughly discussed with the patient and questions were answered. The benefits, risks, alternatives, and complications were also discussed. The patient understands and wishes to proceed with the procedure. Written consent was obtained.  Ultrasound was performed to localize and [place] an adequate site for the biopsy. The patient was then prepped and draped in a normal sterile fashion. Local anesthesia was provided with 1% lidocaine. Using direct ultrasound guidance, 2 passes were made using needles into the nodule within the right lobe of the thyroid. Ultrasound was used to confirm needle placements on all occasions. Specimens were sent to Pathology for analysis.  Complications:  None  IMPRESSION: Ultrasound guided needle aspirate biopsy performed of the right thyroid nodule.   Electronically Signed   By: Lorriane Shire M.D.   On: 05/13/2014 14:40   Hospital Course  This was an 81 years old male patient with history of multiple medical illnesses was admitted due hypotension and syncopal episode. Patient has a long stonding history of alcohol abuse and he has been drinking heavily few days before admission. Patient was given a bolus of fluid and was admitted under telemetry with iv fluid. His chest x-ray showed left lung opacity. Further evaluation by CT Scan showed multiple findings including left lung pneumonia and atelectasis. He was started on combinations of IV antibiotics and pulmonary consult was done. While patient was in  the floor he he developed tachycardia and slight elevation of troponin. He transferred to ICU  On on call physician and started on heparin drip. Cardiology consult also requested. While patient was in ICU suddenly he went into cardiac arrest and code was called. He was tried to be resuscitated according to ACLS. However, the code was no successful  after 40 minutes of attempt. The code was called off. I called the family and informed the situation.      Signed: Eleanore Junio   05/25/2014, 7:35 AM

## 2014-06-03 NOTE — Progress Notes (Signed)
RN SPOKE W/ PT'S GRANDDAUGHTER AGAIN. SHE HAS LEFT A MESSAGE FOR HER FATHER,THE PT'S SON TO GET IN TOUCH W/ [facility] ASAP. SHE IS NOT COMFORTABLE MAKING DECISION CONCERNING WHICH FUNERAL HOME SHOULD PICK UP PT'S REMAINS. SHE HAS AGREED THAT PT MAY GO TO [place] UNTIL FAMILY MAKES A DECISION. SHE HAS ALSO TAKEN HOME PT'S WALLET.

## 2014-06-03 NOTE — Progress Notes (Signed)
Subjective: Patient developed chest pain and tachyarrhythmia. EKG and cardiac enzymes were done and patient was evaluated by Dr Luan Pulling on call physician. He was moved to ICU and was started on heparin drip. Patient was determined to be not a candidate for cardiac cath. He became also hypotheric. He is confused and disoriented. Objective: Vital signs in last 24 hours: Temp:  [91.5 F (33.1 C)-98.1 F (36.7 C)] 96.6 F (35.9 C) (05/12 0730) Pulse Rate:  [26-141] 103 (05/12 1045) Resp:  [18-34] 32 (05/12 1045) BP: (70-115)/(48-99) 93/65 mmHg (05/12 1045) SpO2:  [85 %-100 %] 100 % (05/12 1045) Weight:  [54.5 kg (120 lb 2.4 oz)] 54.5 kg (120 lb 2.4 oz) (05/12 0500) Weight change:  Last BM Date: 05/13/14  Intake/Output from previous day: 05/11 0701 - 05/12 0700 In: 250 [IV Piggyback:250] Out: -   PHYSICAL EXAM General appearance: cachectic, delirious and slowed mentation Resp: diminished breath sounds bibasilar and rhonchi bibasilar Cardio: S1, S2 normal GI: soft, non-tender; bowel sounds normal; no masses,  no organomegaly Extremities: extremities normal, atraumatic, no cyanosis or edema  Lab Results:  Results for orders placed or performed during the hospital encounter of 05/28/2014 (from the past 48 hour(s))  Basic metabolic panel     Status: Abnormal   Collection Time: 05/13/14  5:19 AM  Result Value Ref Range   Sodium 133 (L) 135 - 145 mmol/L   Potassium 5.2 (H) 3.5 - 5.1 mmol/L    Comment: DELTA CHECK NOTED   Chloride 106 101 - 111 mmol/L   CO2 19 (L) 22 - 32 mmol/L   Glucose, Bld 157 (H) 70 - 99 mg/dL   BUN 10 6 - 20 mg/dL   Creatinine, Ser 0.71 0.61 - 1.24 mg/dL   Calcium 7.5 (L) 8.9 - 10.3 mg/dL   GFR calc non Af Amer >60 >60 mL/min   GFR calc Af Amer >60 >60 mL/min    Comment: (NOTE) The eGFR has been calculated using the CKD EPI equation. This calculation has not been validated in all clinical situations. eGFR's persistently <60 mL/min signify possible Chronic  Kidney Disease.    Anion gap 8 5 - 15  Clostridium Difficile by PCR     Status: None   Collection Time: 05/13/14  9:15 AM  Result Value Ref Range   C difficile by pcr NEGATIVE NEGATIVE  Troponin I (q 6hr x 3)     Status: Abnormal   Collection Time: 05/13/14  4:24 PM  Result Value Ref Range   Troponin I 0.12 (H) <0.031 ng/mL    Comment:        PERSISTENTLY INCREASED TROPONIN VALUES IN THE RANGE OF 0.04-0.49 ng/mL CAN BE SEEN IN:       -UNSTABLE ANGINA       -CONGESTIVE HEART FAILURE       -MYOCARDITIS       -CHEST TRAUMA       -ARRYHTHMIAS       -LATE PRESENTING MYOCARDIAL INFARCTION       -COPD   CLINICAL FOLLOW-UP RECOMMENDED.   MRSA PCR Screening     Status: None   Collection Time: 05/13/14  9:30 PM  Result Value Ref Range   MRSA by PCR NEGATIVE NEGATIVE    Comment:        The GeneXpert MRSA Assay (FDA approved for NASAL specimens only), is one component of a comprehensive MRSA colonization surveillance program. It is not intended to diagnose MRSA infection nor to guide or monitor treatment for MRSA  infections.   Troponin I (q 6hr x 3)     Status: Abnormal   Collection Time: 05/13/14  9:58 PM  Result Value Ref Range   Troponin I 0.14 (H) <0.031 ng/mL    Comment:        PERSISTENTLY INCREASED TROPONIN VALUES IN THE RANGE OF 0.04-0.49 ng/mL CAN BE SEEN IN:       -UNSTABLE ANGINA       -CONGESTIVE HEART FAILURE       -MYOCARDITIS       -CHEST TRAUMA       -ARRYHTHMIAS       -LATE PRESENTING MYOCARDIAL INFARCTION       -COPD   CLINICAL FOLLOW-UP RECOMMENDED.   Troponin I (q 6hr x 3)     Status: Abnormal   Collection Time: 2014-06-07  4:17 AM  Result Value Ref Range   Troponin I 0.25 (H) <0.031 ng/mL    Comment:        PERSISTENTLY INCREASED TROPONIN VALUES IN THE RANGE OF 0.04-0.49 ng/mL CAN BE SEEN IN:       -UNSTABLE ANGINA       -CONGESTIVE HEART FAILURE       -MYOCARDITIS       -CHEST TRAUMA       -ARRYHTHMIAS       -LATE PRESENTING MYOCARDIAL  INFARCTION       -COPD   CLINICAL FOLLOW-UP RECOMMENDED.   Glucose, capillary     Status: Abnormal   Collection Time: Jun 07, 2014 12:31 PM  Result Value Ref Range   Glucose-Capillary 100 (H) 65 - 99 mg/dL    ABGS No results for input(s): PHART, PO2ART, TCO2, HCO3 in the last 72 hours.  Invalid input(s): PCO2 CULTURES Recent Results (from the past 240 hour(s))  Clostridium Difficile by PCR     Status: None   Collection Time: 05/13/14  9:15 AM  Result Value Ref Range Status   C difficile by pcr NEGATIVE NEGATIVE Final  MRSA PCR Screening     Status: None   Collection Time: 05/13/14  9:30 PM  Result Value Ref Range Status   MRSA by PCR NEGATIVE NEGATIVE Final    Comment:        The GeneXpert MRSA Assay (FDA approved for NASAL specimens only), is one component of a comprehensive MRSA colonization surveillance program. It is not intended to diagnose MRSA infection nor to guide or monitor treatment for MRSA infections.    Studies/Results: US Thyroid Biopsy  05/13/2014   CLINICAL DATA:  Thyroid nodule.  EXAM: ULTRASOUND GUIDED NEEDLE ASPIRATE BIOPSY OF THE THYROID GLAND  COMPARISON:  None.  PROCEDURE: Thyroid biopsy was thoroughly discussed with the patient and questions were answered. The benefits, risks, alternatives, and complications were also discussed. The patient understands and wishes to proceed with the procedure. Written consent was obtained.  Ultrasound was performed to localize and [place] an adequate site for the biopsy. The patient was then prepped and draped in a normal sterile fashion. Local anesthesia was provided with 1% lidocaine. Using direct ultrasound guidance, 2 passes were made using needles into the nodule within the right lobe of the thyroid. Ultrasound was used to confirm needle placements on all occasions. Specimens were sent to Pathology for analysis.  Complications:  None  IMPRESSION: Ultrasound guided needle aspirate biopsy performed of the right thyroid  nodule.   Electronically Signed   By: Lorriane Shire M.D.   On: 05/13/2014 14:40    Medications: I have reviewed the patient's current  medications.  Assesment:   Principal Problem:   Syncope Active Problems:   Orthostasis   ETOH abuse   Protein-calorie malnutrition, severe   Hypokalemia lung base opacity Pneumonia Tachyarrhythmia Probably NTSMI  Plan:  Medications reviewed Will continue telemetry Will do cardiology consult Discussed with Dr.Hawkins on call physician who managed patient during the night Will continue IV antibiotics Will continue current treatment pending cardiology recommendation Patient was seen at 8 am and this a late entry note   LOS: 2 days   Tilmon Wisehart 05/22/2014, 12:44 PM

## 2014-06-03 NOTE — Progress Notes (Signed)
Subjective: He was transferred to stepdown last night because of elevated troponin level. Troponin was obtained because he had been having episodes of cardiac arrhythmia. EKG was obtained and it showed possible lateral ischemic changes.  Objective: Vital signs in last 24 hours: Temp:  [91.5 F (33.1 C)-98.1 F (36.7 C)] 96.2 F (35.7 C) (05/12 0400) Pulse Rate:  [26-118] 103 (05/12 0500) Resp:  [18-34] 30 (05/12 0500) BP: (76-115)/(58-99) 92/70 mmHg (05/12 0500) SpO2:  [91 %-100 %] 100 % (05/12 0500) Weight:  [54.5 kg (120 lb 2.4 oz)] 54.5 kg (120 lb 2.4 oz) (05/12 0500) Weight change:  Last BM Date: 05/13/14  Intake/Output from previous day:    PHYSICAL EXAM General appearance: alert and Confused Resp: rhonchi bilaterally Cardio: regular rate and rhythm, S1, S2 normal, no murmur, click, rub or gallop GI: soft, non-tender; bowel sounds normal; no masses,  no organomegaly Extremities: extremities normal, atraumatic, no cyanosis or edema  Lab Results:  Results for orders placed or performed during the hospital encounter of 06/02/2014 (from the past 48 hour(s))  Basic metabolic panel     Status: Abnormal   Collection Time: 05/13/14  5:19 AM  Result Value Ref Range   Sodium 133 (L) 135 - 145 mmol/L   Potassium 5.2 (H) 3.5 - 5.1 mmol/L    Comment: DELTA CHECK NOTED   Chloride 106 101 - 111 mmol/L   CO2 19 (L) 22 - 32 mmol/L   Glucose, Bld 157 (H) 70 - 99 mg/dL   BUN 10 6 - 20 mg/dL   Creatinine, Ser 0.71 0.61 - 1.24 mg/dL   Calcium 7.5 (L) 8.9 - 10.3 mg/dL   GFR calc non Af Amer >60 >60 mL/min   GFR calc Af Amer >60 >60 mL/min    Comment: (NOTE) The eGFR has been calculated using the CKD EPI equation. This calculation has not been validated in all clinical situations. eGFR's persistently <60 mL/min signify possible Chronic Kidney Disease.    Anion gap 8 5 - 15  Clostridium Difficile by PCR     Status: None   Collection Time: 05/13/14  9:15 AM  Result Value Ref Range   C  difficile by pcr NEGATIVE NEGATIVE  Troponin I (q 6hr x 3)     Status: Abnormal   Collection Time: 05/13/14  4:24 PM  Result Value Ref Range   Troponin I 0.12 (H) <0.031 ng/mL    Comment:        PERSISTENTLY INCREASED TROPONIN VALUES IN THE RANGE OF 0.04-0.49 ng/mL CAN BE SEEN IN:       -UNSTABLE ANGINA       -CONGESTIVE HEART FAILURE       -MYOCARDITIS       -CHEST TRAUMA       -ARRYHTHMIAS       -LATE PRESENTING MYOCARDIAL INFARCTION       -COPD   CLINICAL FOLLOW-UP RECOMMENDED.   MRSA PCR Screening     Status: None   Collection Time: 05/13/14  9:30 PM  Result Value Ref Range   MRSA by PCR NEGATIVE NEGATIVE    Comment:        The GeneXpert MRSA Assay (FDA approved for NASAL specimens only), is one component of a comprehensive MRSA colonization surveillance program. It is not intended to diagnose MRSA infection nor to guide or monitor treatment for MRSA infections.   Troponin I (q 6hr x 3)     Status: Abnormal   Collection Time: 05/13/14  9:58 PM  Result  Value Ref Range   Troponin I 0.14 (H) <0.031 ng/mL    Comment:        PERSISTENTLY INCREASED TROPONIN VALUES IN THE RANGE OF 0.04-0.49 ng/mL CAN BE SEEN IN:       -UNSTABLE ANGINA       -CONGESTIVE HEART FAILURE       -MYOCARDITIS       -CHEST TRAUMA       -ARRYHTHMIAS       -LATE PRESENTING MYOCARDIAL INFARCTION       -COPD   CLINICAL FOLLOW-UP RECOMMENDED.     ABGS No results for input(s): PHART, PO2ART, TCO2, HCO3 in the last 72 hours.  Invalid input(s): PCO2 CULTURES Recent Results (from the past 240 hour(s))  Clostridium Difficile by PCR     Status: None   Collection Time: 05/13/14  9:15 AM  Result Value Ref Range Status   C difficile by pcr NEGATIVE NEGATIVE Final  MRSA PCR Screening     Status: None   Collection Time: 05/13/14  9:30 PM  Result Value Ref Range Status   MRSA by PCR NEGATIVE NEGATIVE Final    Comment:        The GeneXpert MRSA Assay (FDA approved for NASAL specimens only), is  one component of a comprehensive MRSA colonization surveillance program. It is not intended to diagnose MRSA infection nor to guide or monitor treatment for MRSA infections.    Studies/Results: US Soft Tissue Head/neck  05/12/2014   CLINICAL DATA:  81 year old male with a history of right thyroid nodule. Nodule identified on prior chest CT.  EXAM: THYROID ULTRASOUND  TECHNIQUE: Ultrasound examination of the thyroid gland and adjacent soft tissues was performed.  COMPARISON:  Chest CT 05/11/2014  FINDINGS: Right thyroid lobe  Measurements: 5.4 cm x 2.3 cm x 3.1 cm. Dominant right-sided nodule measures 3.0 cm x 1.9 cm x 2.2 cm. Internal reflectors compatible with calcifications.  Left thyroid lobe  Measurements: 5.3 cm x 2.6 cm by 2.2 cm. Single left-sided nodule measures 9 mm.  Isthmus  Thickness: 4 mm.  No nodules visualized.  Lymphadenopathy  None visualized.  IMPRESSION: Multinodular thyroid. Dominant right-sided nodule meets criteria for biopsy. Ultrasound-guided fine needle aspiration should be considered, as per the consensus statement: Management of Thyroid Nodules Detected at [place]: Society of Radiologists in [place]. Radiology 2005; [id number].  Signed,  Dulcy Fanny. Earleen Newport, DO  [facility]  [facility]   Electronically Signed   By: Corrie Mckusick D.O.   On: 05/12/2014 13:03   US Thyroid Biopsy  05/13/2014   CLINICAL DATA:  Thyroid nodule.  EXAM: ULTRASOUND GUIDED NEEDLE ASPIRATE BIOPSY OF THE THYROID GLAND  COMPARISON:  None.  PROCEDURE: Thyroid biopsy was thoroughly discussed with the patient and questions were answered. The benefits, risks, alternatives, and complications were also discussed. The patient understands and wishes to proceed with the procedure. Written consent was obtained.  Ultrasound was performed to localize and [place] an adequate site for the biopsy. The patient was then prepped and draped in a normal  sterile fashion. Local anesthesia was provided with 1% lidocaine. Using direct ultrasound guidance, 2 passes were made using needles into the nodule within the right lobe of the thyroid. Ultrasound was used to confirm needle placements on all occasions. Specimens were sent to Pathology for analysis.  Complications:  None  IMPRESSION: Ultrasound guided needle aspirate biopsy performed of the right thyroid nodule.   Electronically Signed   By: Lorriane Shire  M.D.   On: 05/13/2014 14:40    Medications:  Prior to Admission:  Prescriptions prior to admission  Medication Sig Dispense Refill Last Dose  . PROAIR HFA 108 (90 BASE) MCG/ACT inhaler Inhale 2 puffs into the lungs every 6 (six) hours as needed for wheezing or shortness of breath.   0 05/09/2014 at Unknown time  . cephALEXin (KEFLEX) 500 MG capsule Take 1 capsule (500 mg total) by mouth 3 (three) times daily. (Patient not taking: Reported on 12/22/2013) 21 capsule 0 Completed Course at Unknown time  . traMADol (ULTRAM) 50 MG tablet Take 1 tablet (50 mg total) by mouth every 6 (six) hours as needed. (Patient not taking: Reported on 04/20/2014) 6 tablet 0 Completed Course at Unknown time  . traMADol (ULTRAM) 50 MG tablet Take 1 tablet (50 mg total) by mouth every 6 (six) hours as needed. (Patient not taking: Reported on 04/20/2014) 15 tablet 0 Completed Course at Unknown time   Scheduled: . azithromycin  500 mg Intravenous Q24H  . cefTRIAXone (ROCEPHIN)  IV  1 g Intravenous Q24H  . folic acid  1 mg Oral Daily  . heparin  5,000 Units Subcutaneous 3 times per day  . LORazepam  0-4 mg Intravenous Q12H  . magnesium oxide  400 mg Oral Daily  . methylPREDNISolone (SOLU-MEDROL) injection  40 mg Intravenous Q12H  . multivitamin with minerals  1 tablet Oral Daily  . sodium chloride  3 mL Intravenous Q12H  . thiamine  100 mg Oral Daily   Or  . thiamine  100 mg Intravenous Daily  . tuberculin  5 Units Intradermal Once   Continuous: . sodium chloride 10  mL/hr (05/13/14 0947)   NIO:[id number] **OR** acetaminophen, LORazepam, ondansetron **OR** ondansetron (ZOFRAN) IV, senna-docusate  Assesment: He had what may be ischemic changes on his EKG. His troponin is slightly elevated but looks relatively stable. He has no complaints but is very confused  He has pneumonia and is being treated for that. This is probably from aspiration. He has been hypothermic.  He has protein calorie malnutrition which is severe   Principal Problem:   Syncope Active Problems:   Orthostasis   ETOH abuse   Protein-calorie malnutrition, severe   Hypokalemia    Plan: Continue current treatments. Troponin from this morning is pending. His second EKG still shows what may be ischemic changes so I think it's reasonable to start him on heparin.    LOS: 2 days   Josalyn Dettmann L 2014/05/25, 8:14 AM

## 2014-06-03 NOTE — Progress Notes (Signed)
PT'S GRANDSON FROM [place] CALLED BACK AFTER RECEIVING MESSAGE TO CONTACT us. HE WILL TRY TO GET HERE SOON.

## 2014-06-03 NOTE — Progress Notes (Signed)
Patient continues to have a low rectal temperature of 91.8. Patient is agitated and will not keep the warming blanket on. Contacted Dr. Luan Pulling and received an order for PRN ativan. Will give as ordered. Will continue to monitor closely.

## 2014-06-03 NOTE — Progress Notes (Signed)
ANTICOAGULATION CONSULT NOTE - Initial Consult  Pharmacy Consult for Heparin Indication: elevated troponin  No Known Allergies  Patient Measurements: Height: 5\' 10"  (177.8 cm) Weight: 120 lb 2.4 oz (54.5 kg) IBW/kg (Calculated) : 73 HEPARIN DW (KG): 54.5  Vital Signs: Temp: 96.6 F (35.9 C) (05/12 0730) Temp Source: Axillary (05/12 0730) BP: 84/64 mmHg (05/12 0800) Pulse Rate: 97 (05/12 0800)  Labs:  Recent Labs  05/13/14 0519 05/13/14 1624 05/13/14 2158  CREATININE 0.71  --   --   TROPONINI  --  0.12* 0.14*   Estimated Creatinine Clearance: 56.8 mL/min (by C-G formula based on Cr of 0.71).  Medical History: Past Medical History  Diagnosis Date  . COPD (chronic obstructive pulmonary disease)   . Chronic hip pain     Avascular necrosis, bilateral hips.  . DVT (deep venous thrombosis)   . PE (pulmonary embolism) May/2012.  Marland Kitchen Alcohol abuse   . Elevated PSA     Lost to followup.  . Macrocytic anemia   . Lytic lesion of bone on x-ray May/2012.    Negative bone scan.  . Bilateral pneumonia May/2012.  Marland Kitchen Thoracic compression fracture   . Tobacco abuse   . Alcohol withdrawal syndrome 06/10/2011  . Delirium, drug-induced 06/2011    From Ativan  . Abnormal thyroid function test 06/2011.    Normal TSH and slightly low free T4.  Marland Kitchen Hypoglycemia   . Dysphagia   . Syncope and collapse   . Hematuria   . Spinal stenosis   . Venous thrombosis   . 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 Chronic back pain   . Cancer     malignant neoplasm of prostate   Assessment: 81yo male in ICU with elevated troponin.  Asked to initiate IV Heparin.  EKG showed possible ischemic changes.  SQ Heparin has been d/c'd, last dose was this morning ~ 0530.  Goal of Therapy:  Heparin level 0.3-0.7 units/ml w/in 24 hrs of initiation Monitor platelets by anticoagulation protocol: Yes   Plan:   Heparin 2000 units IV bolus now x 1  Heparin infusion at 650 units/hr  Check Heparin  level in 6 hrs then daily  CBC daily while on Heparin  [place] Crane, [place] Rios A May 30, 2014,10:43 AM

## 2014-06-03 NOTE — Progress Notes (Signed)
PT'S REMAINS TAKEN TO MORGUE W/ HIS PERSONAL CLOTHING.

## 2014-06-03 NOTE — Progress Notes (Signed)
Progress note   CODE BLUE was called at 1214, found unresponsive and cyanotic by nursing staff, chest compressions were immediately initiated. Rapid response team immediately responded, pads were placed. Chest compressions were effective. He was administered epinephrine and atropine. After approximately 20 minutes of resuscitation, he had return to rhythm, pulse felt on exam, patient intubated by Dr. Orvan Falconer. After approximately 1 minute he went back in to asystole and chest compressions where restarted. He was given several more rounds of epinephrine. His PCP Dr Legrand Rams presented at bedside. He remained in PEA/Asystole on subsequent rhythm checks. Family was not present during resuscitation. Resuscitation was discontinued at 1240. Code called and patient prounounced dead at 12:40.  Dr Orvan Falconer and and Dr Kalman Jewels were present and assisted with the code. Dr Legrand Rams spoke with family members.

## 2014-06-03 DEATH — deceased

## 2016-10-30 IMAGING — CT CT HIP*L* W/O CM
2 series · 14 of 32 positions shown, 19 images · non-contrast
Comparison: Left hip radiographs earlier today.

CLINICAL DATA: Fall while transferring from wheelchair to couch.
Left hip pain. Initial encounter.

EXAM:
CT OF THE LEFT HIP WITHOUT CONTRAST
TECHNIQUE: Multidetector CT imaging of the left hip was performed according to
the standard protocol. Multiplanar CT image reconstructions were
also generated.

[Series 5: hip st · axial · 0.44mm/px · z∈[-315,-180]mm · 7 of 37 slices shown, 12 images]
[im 5/37  soft-tissue]
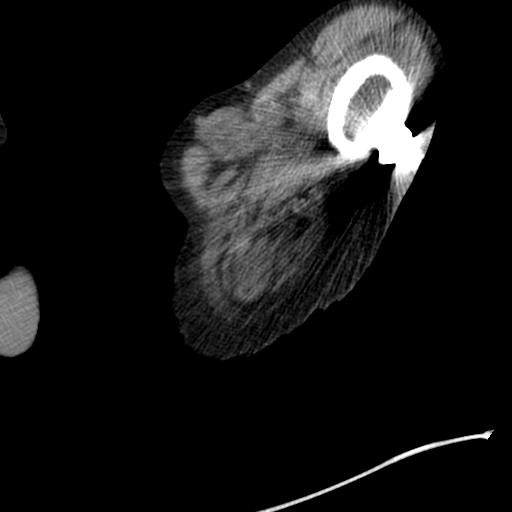
[im 5/37  bone]
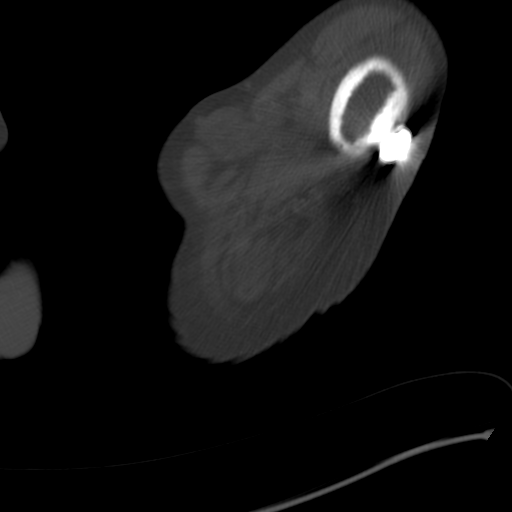
[im 10/37  soft-tissue]
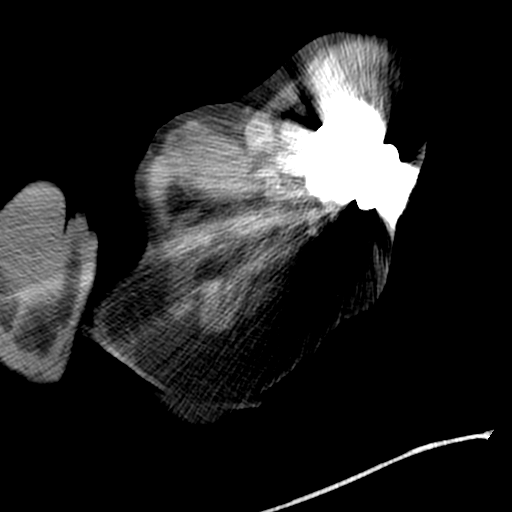
[im 14/37  soft-tissue]
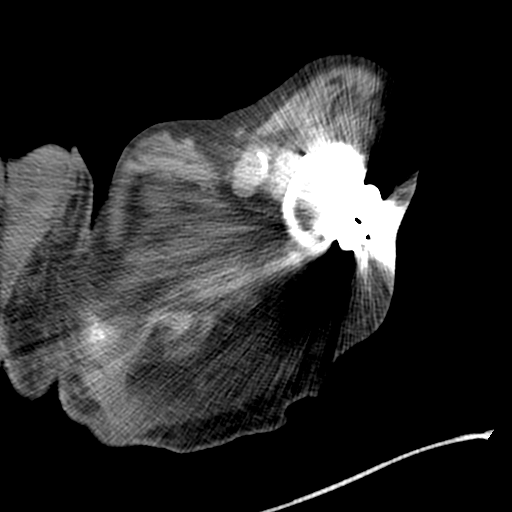
[im 19/37  soft-tissue]
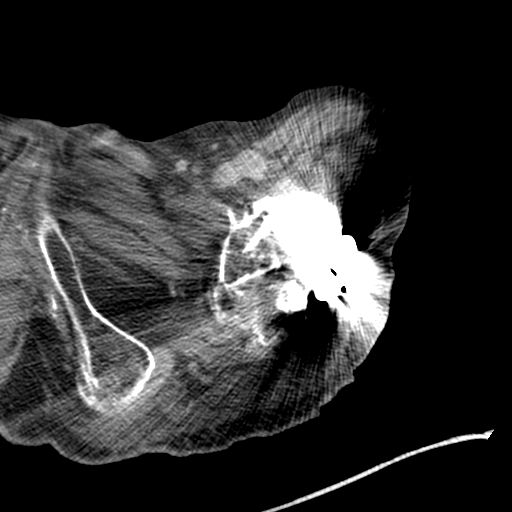
[im 19/37  lung]
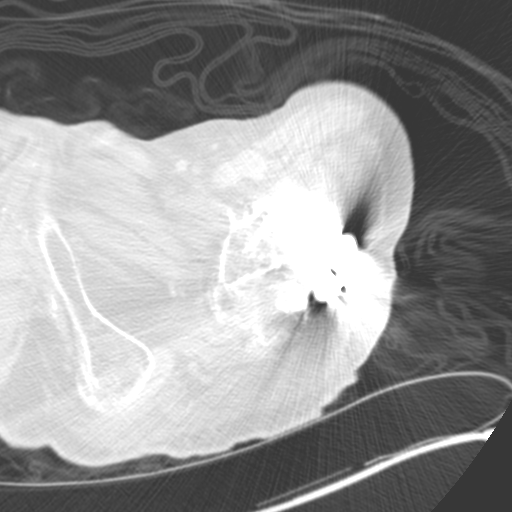
[im 23/37  soft-tissue]
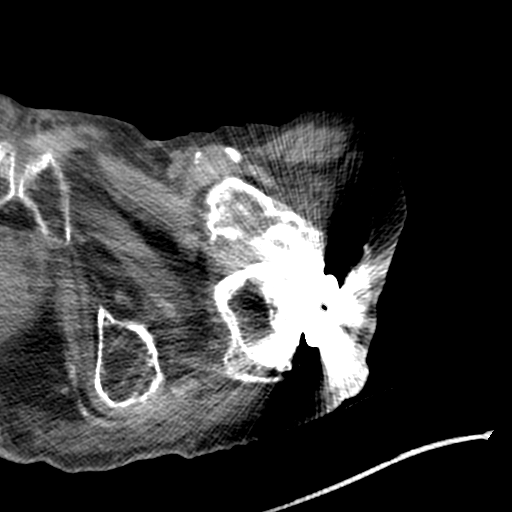
[im 23/37  lung]
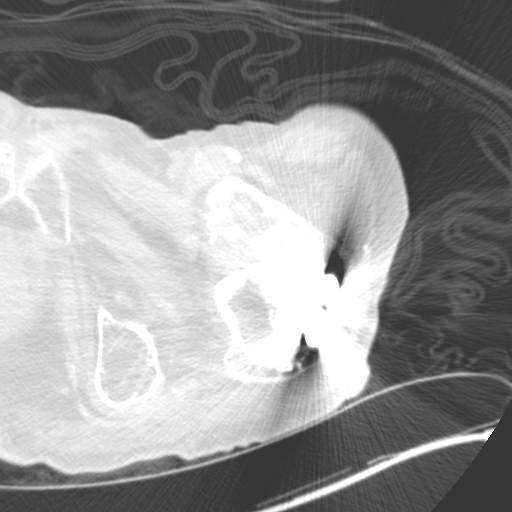
[im 28/37  soft-tissue]
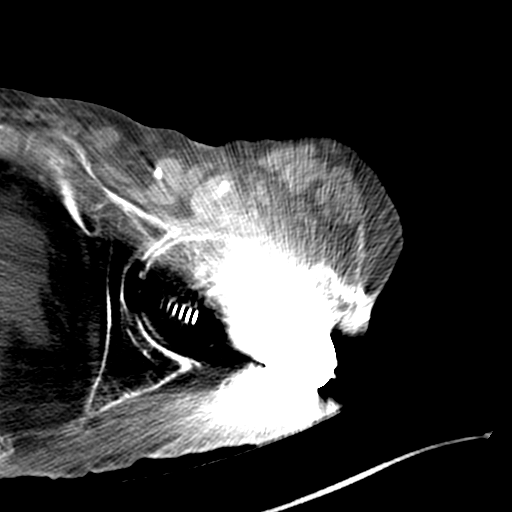
[im 28/37  lung]
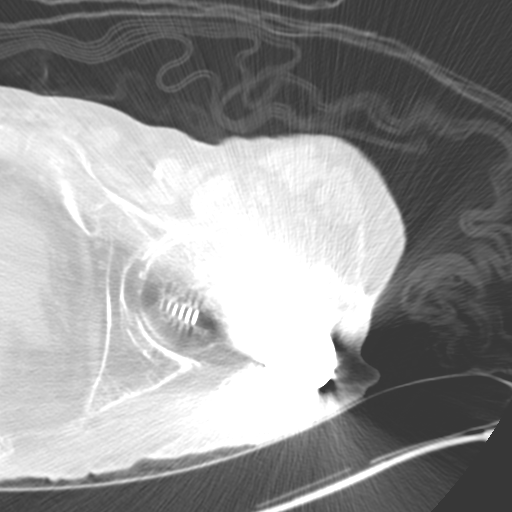
[im 32/37  soft-tissue]
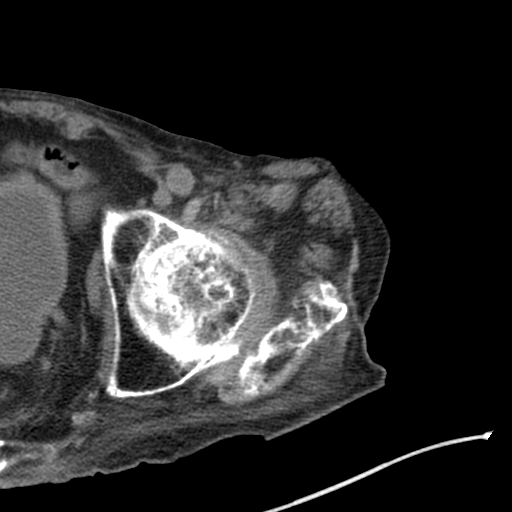
[im 32/37  lung]
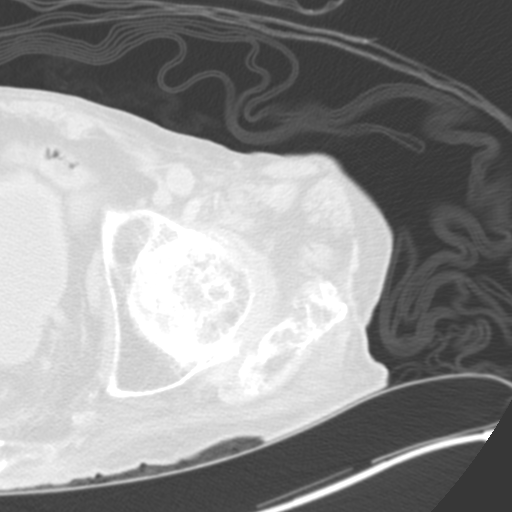

[Series 8: axial bone · axial · 0.41mm/px · z∈[-316,-188]mm · 7 of 89 slices shown]
[im 9/89  bone]
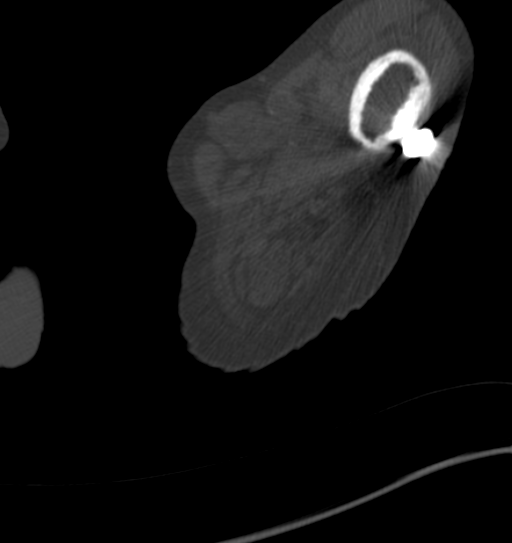
[im 17/89  bone]
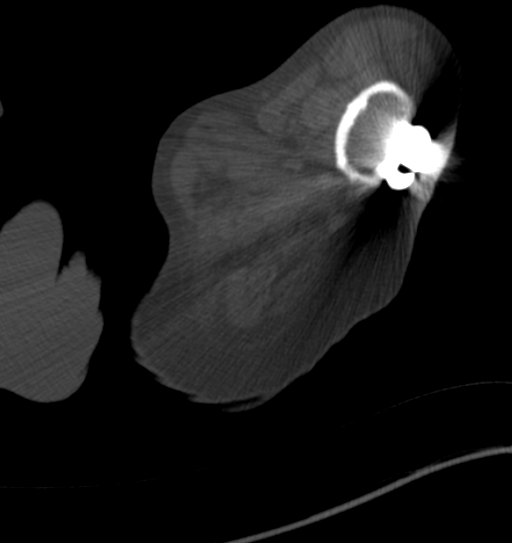
[im 29/89  bone]
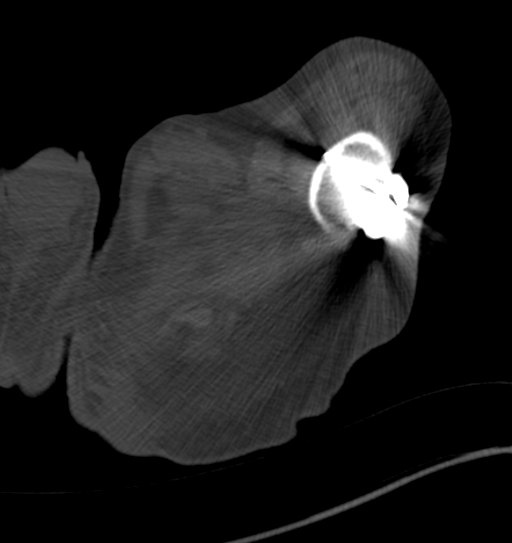
[im 41/89  bone]
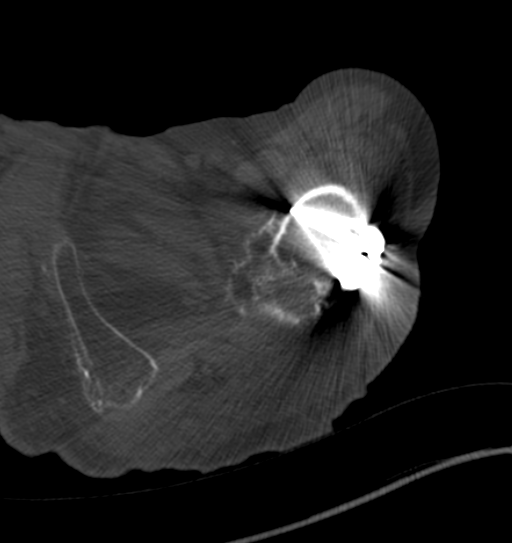
[im 49/89  bone]
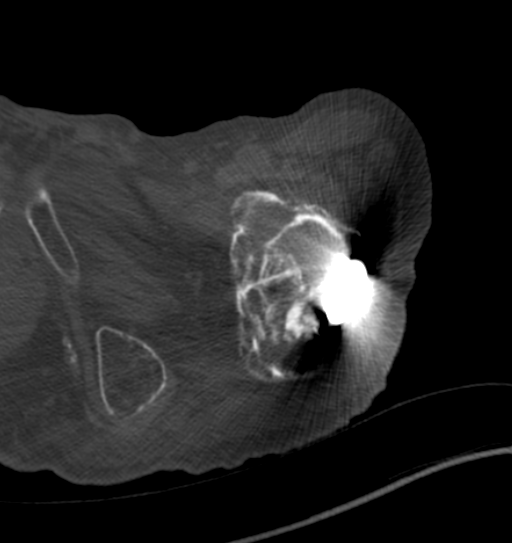
[im 61/89  bone]
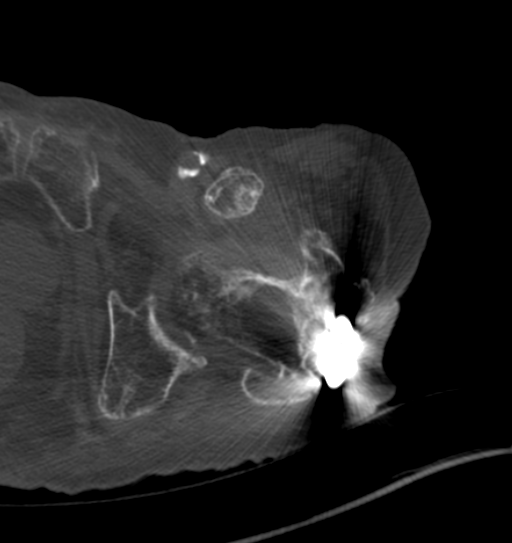
[im 73/89  bone]
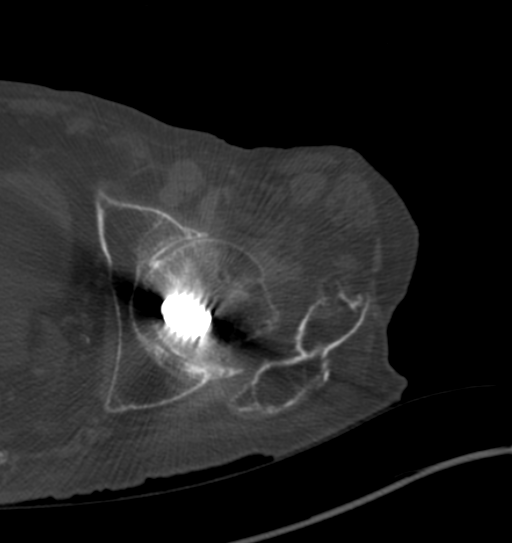

[14 of 32 positions shown; findings below may reference images not displayed]

FINDINGS: Chronic posttraumatic deformity is again seen of the proximal left
femur with dynamic hip screw and lateral plate fixation remaining in
place. This fixation hardware results in areas of mild-to-moderate
streak artifact. Allowing for this and limitation of diffuse
osteopenia, no acute fracture is identified. There is chronic left
hip joint space narrowing. Regions of mixed subchondral sclerosis
and lucency in the femoral head with slight femoral head flattening
may reflect osteonecrosis. Heterotopic ossification is noted
anterior to the proximal femur. Femoral arterial vascular
calcification is noted.
IMPRESSION: Postsurgical and posttraumatic changes in the proximal left femur as
above. No acute fracture identified.

## 2016-10-30 IMAGING — CR DG HIP (WITH OR WITHOUT PELVIS) 2-3V*L*
3 series · 3 of 3 positions shown · non-contrast
Comparison: December 22, 2013.

CLINICAL DATA: Acute left hip pain after fall at home. Initial
encounter.

EXAM:
LEFT HIP (WITH PELVIS) 2-3 VIEWS

[x pelvis]
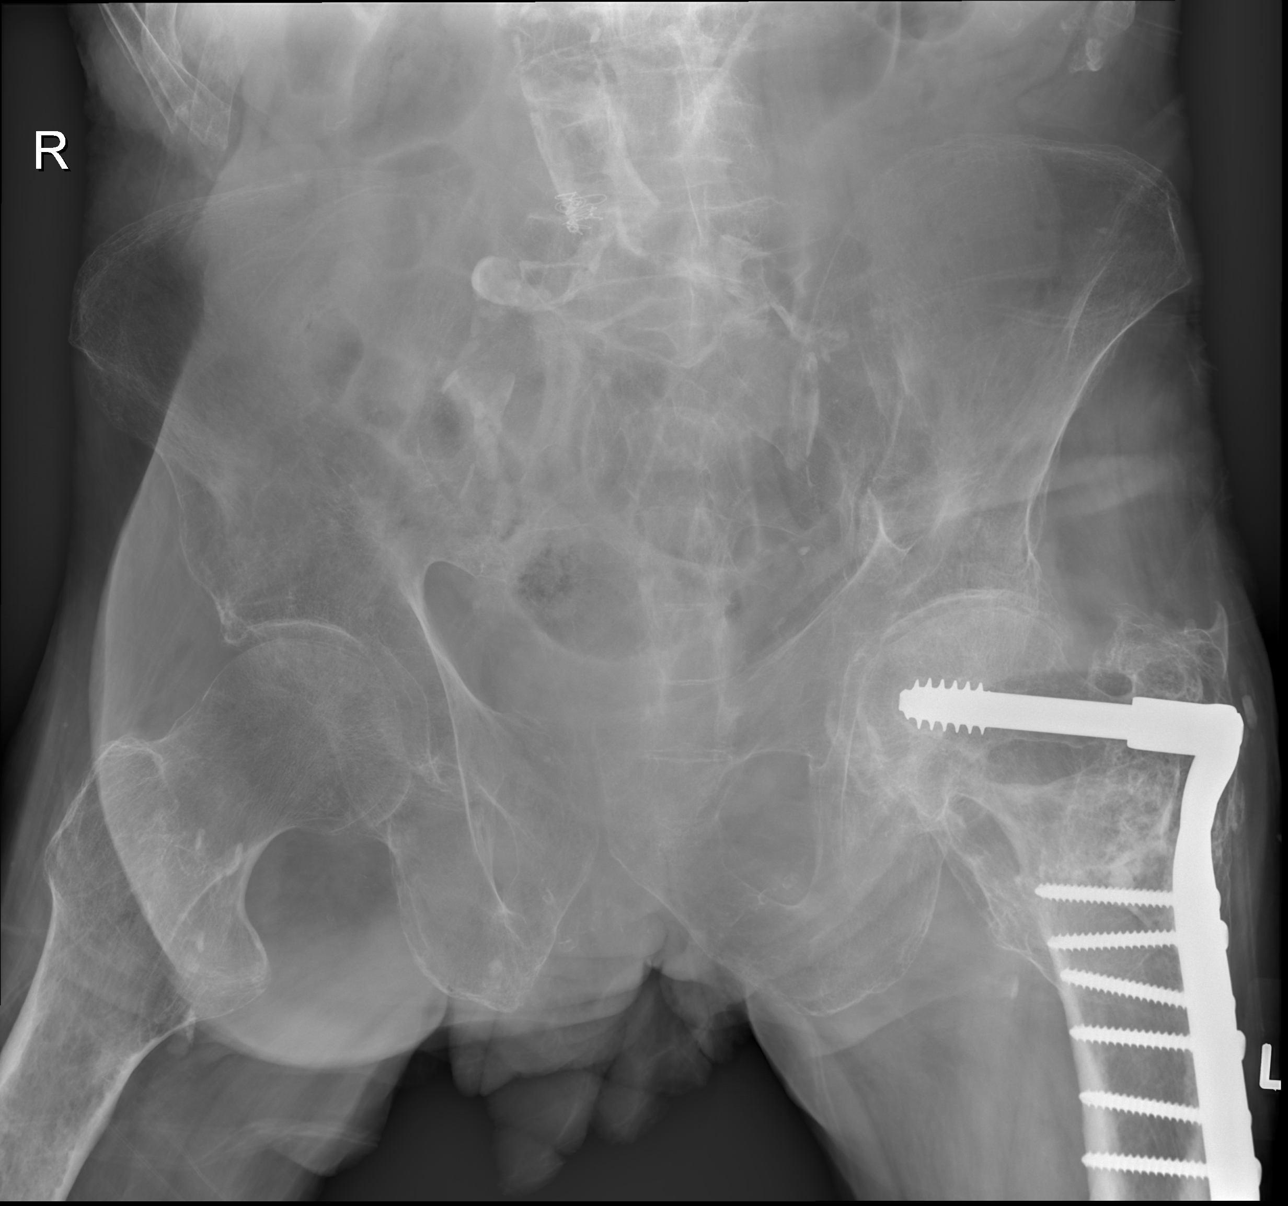

[x hip ap left (1 of 2)]
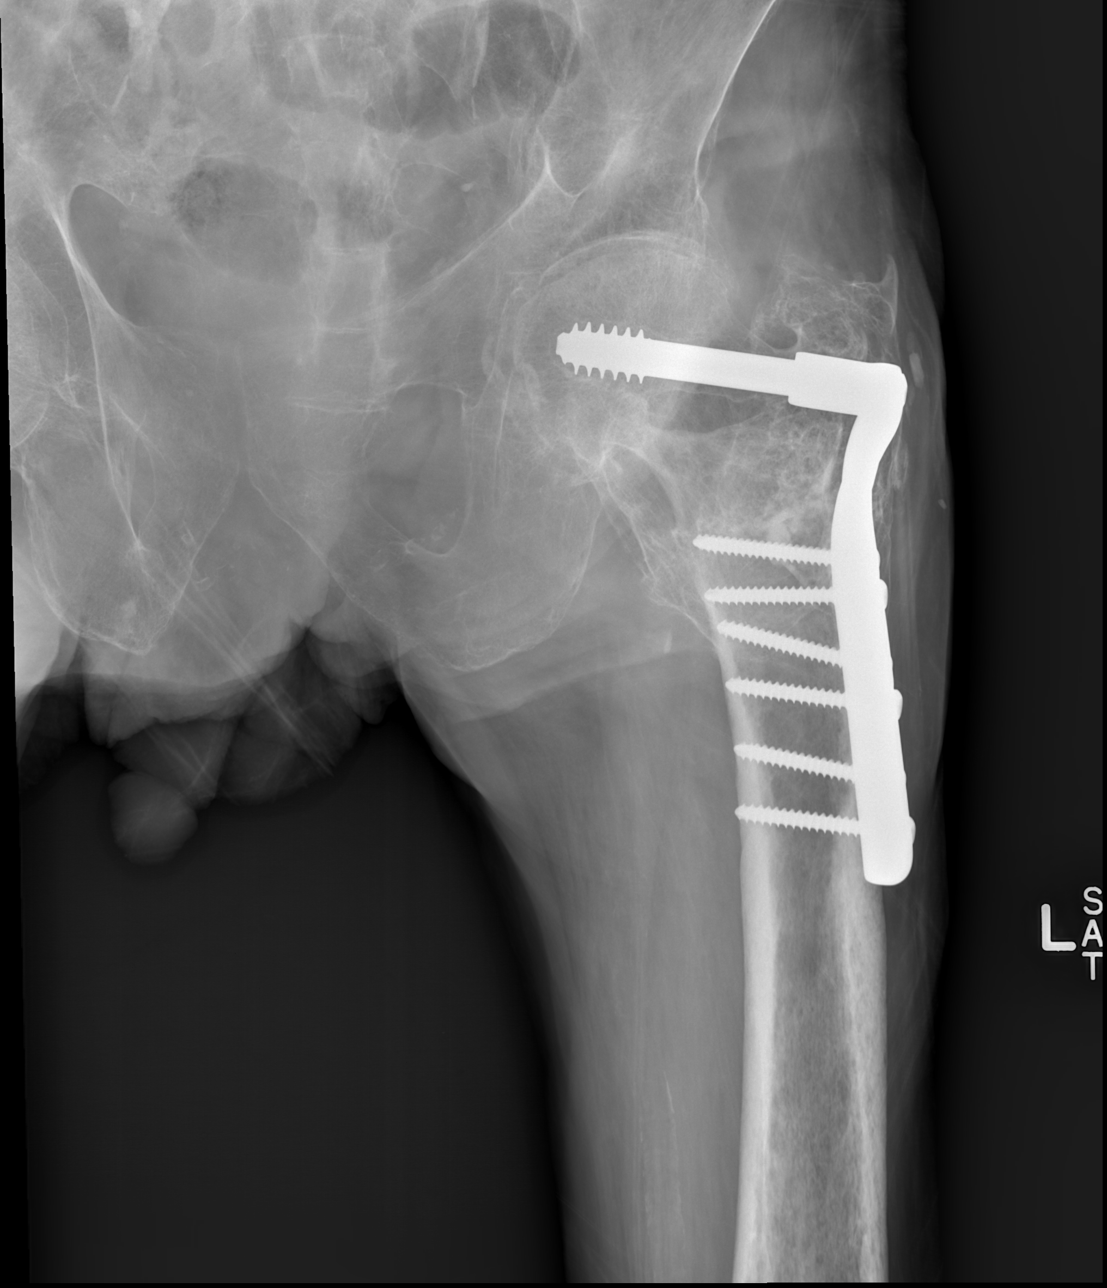

[x hip ap left (2 of 2)]
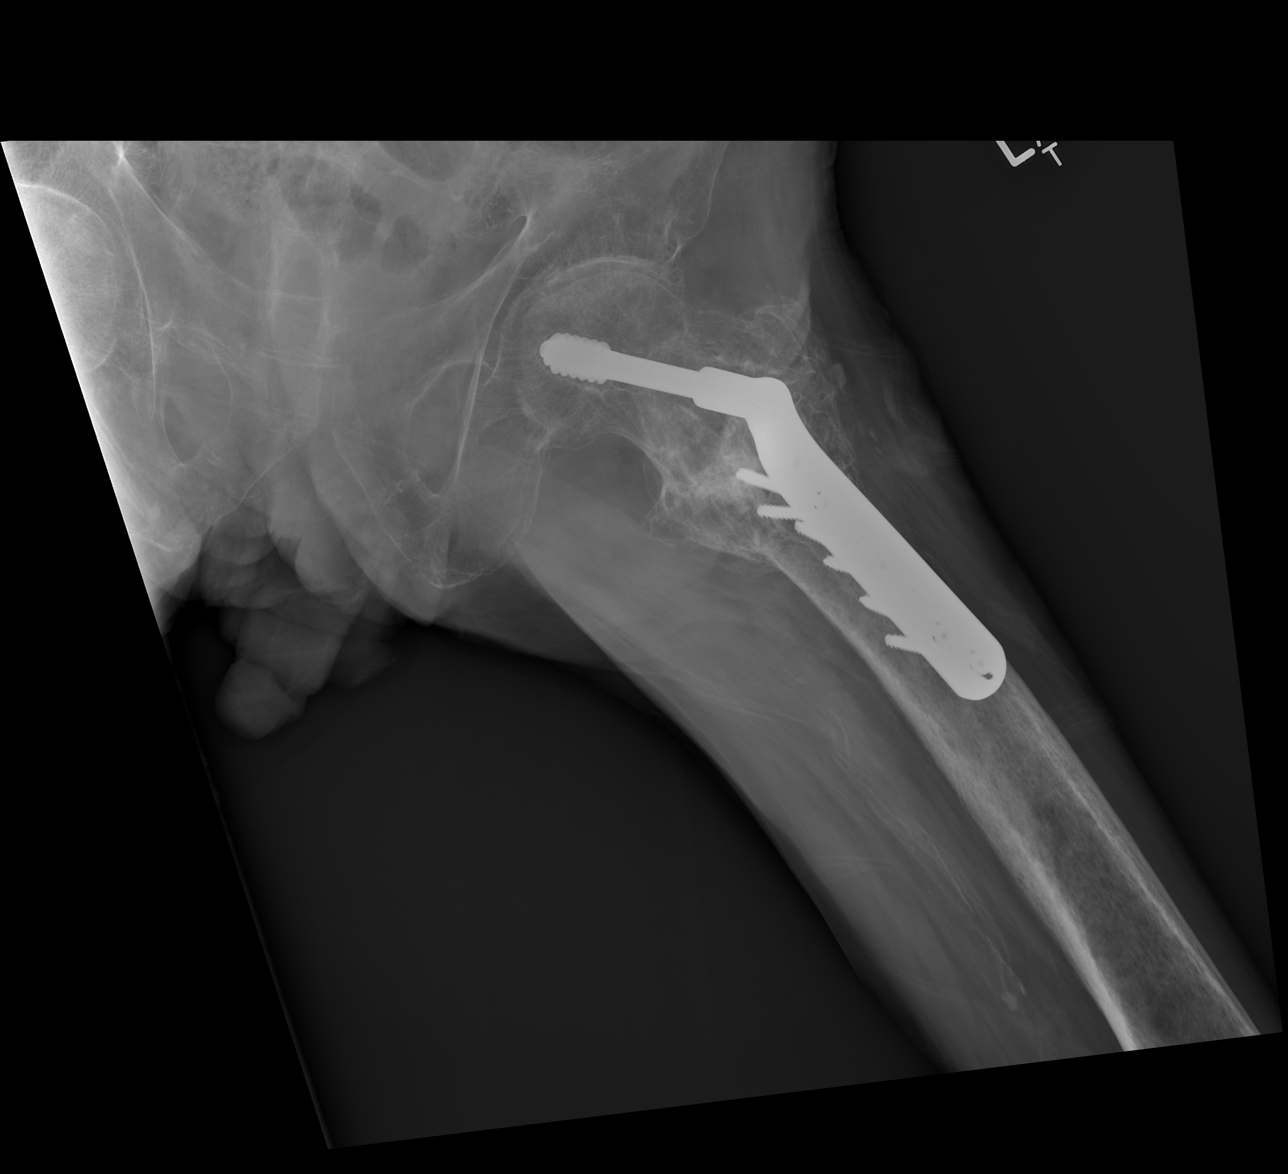

[3 of 3 positions shown; findings below may reference images not displayed]

FINDINGS: Status post surgical internal fixation of old proximal left femoral
fracture. No acute fracture or dislocation is noted. Severe
degenerative change of left hip is noted. Diffuse osteopenia is
noted.
IMPRESSION: Postsurgical and degenerative changes as described above. No acute
fracture or dislocation is noted.
